# Patient Record
Sex: Male | Born: 1955 | Race: White | Hispanic: No | Marital: Married | Smoking: Former smoker
Health system: Southern US, Community
[De-identification: ages and names within clinical notes are randomized; demographics above are authoritative.]

## PROBLEM LIST (undated history)

## (undated) DIAGNOSIS — Z789 Other specified health status: Secondary | ICD-10-CM

## (undated) DIAGNOSIS — K219 Gastro-esophageal reflux disease without esophagitis: Secondary | ICD-10-CM

## (undated) DIAGNOSIS — J309 Allergic rhinitis, unspecified: Secondary | ICD-10-CM

## (undated) DIAGNOSIS — J329 Chronic sinusitis, unspecified: Secondary | ICD-10-CM

## (undated) DIAGNOSIS — Z0282 Encounter for adoption services: Secondary | ICD-10-CM

## (undated) DIAGNOSIS — F419 Anxiety disorder, unspecified: Secondary | ICD-10-CM

## (undated) DIAGNOSIS — T7840XA Allergy, unspecified, initial encounter: Secondary | ICD-10-CM

## (undated) DIAGNOSIS — M1990 Unspecified osteoarthritis, unspecified site: Secondary | ICD-10-CM

## (undated) DIAGNOSIS — I10 Essential (primary) hypertension: Secondary | ICD-10-CM

## (undated) DIAGNOSIS — R12 Heartburn: Secondary | ICD-10-CM

## (undated) DIAGNOSIS — C679 Malignant neoplasm of bladder, unspecified: Secondary | ICD-10-CM

## (undated) DIAGNOSIS — J302 Other seasonal allergic rhinitis: Secondary | ICD-10-CM

## (undated) DIAGNOSIS — D494 Neoplasm of unspecified behavior of bladder: Secondary | ICD-10-CM

## (undated) DIAGNOSIS — C801 Malignant (primary) neoplasm, unspecified: Secondary | ICD-10-CM

## (undated) HISTORY — DX: Malignant (primary) neoplasm, unspecified: C80.1

## (undated) HISTORY — DX: Allergy, unspecified, initial encounter: T78.40XA

## (undated) HISTORY — DX: Gastro-esophageal reflux disease without esophagitis: K21.9

## (undated) MED FILL — Fosaprepitant Dimeglumine For IV Infusion 150 MG (Base Eq): INTRAVENOUS | Qty: 5 | Status: AC

---

## 2017-11-14 ENCOUNTER — Other Ambulatory Visit: Payer: Self-pay

## 2017-11-14 ENCOUNTER — Emergency Department (HOSPITAL_COMMUNITY)
Admission: EM | Admit: 2017-11-14 | Discharge: 2017-11-14 | Disposition: A | Discharge status: Home / Self Care | Payer: No Typology Code available for payment source | Attending: Emergency Medicine | Admitting: Emergency Medicine

## 2017-11-14 ENCOUNTER — Encounter (HOSPITAL_COMMUNITY): Payer: Self-pay | Admitting: Emergency Medicine

## 2017-11-14 ENCOUNTER — Emergency Department (HOSPITAL_COMMUNITY): Payer: No Typology Code available for payment source

## 2017-11-14 DIAGNOSIS — J9801 Acute bronchospasm: Secondary | ICD-10-CM | POA: Diagnosis not present

## 2017-11-14 DIAGNOSIS — Z79899 Other long term (current) drug therapy: Secondary | ICD-10-CM | POA: Insufficient documentation

## 2017-11-14 DIAGNOSIS — R0602 Shortness of breath: Secondary | ICD-10-CM | POA: Diagnosis present

## 2017-11-14 LAB — BASIC METABOLIC PANEL
ANION GAP: 9 (ref 5–15)
BUN: 7 mg/dL — ABNORMAL LOW (ref 8–23)
CO2: 22 mmol/L (ref 22–32)
Calcium: 9 mg/dL (ref 8.9–10.3)
Chloride: 99 mmol/L (ref 98–111)
Creatinine, Ser: 0.69 mg/dL (ref 0.61–1.24)
GFR calc Af Amer: >60 mL/min (ref >60)
GFR calc non Af Amer: >60 mL/min (ref >60)
GLUCOSE: 109 mg/dL — AB (ref 70–99)
POTASSIUM: 3.7 mmol/L (ref 3.5–5.1)
Sodium: 130 mmol/L — ABNORMAL LOW (ref 135–145)

## 2017-11-14 LAB — CBC WITH DIFFERENTIAL/PLATELET
Basophils Absolute: 0 10*3/uL (ref 0.0–0.1)
Basophils Relative: 1 %
Eosinophils Absolute: 0.1 10*3/uL (ref 0.0–0.7)
Eosinophils Relative: 1 %
HEMATOCRIT: 40.7 % (ref 39.0–52.0)
Hemoglobin: 14.5 g/dL (ref 13.0–17.0)
LYMPHS ABS: 2 10*3/uL (ref 0.7–4.0)
LYMPHS PCT: 40 %
MCH: 33.2 pg (ref 26.0–34.0)
MCHC: 35.6 g/dL (ref 30.0–36.0)
MCV: 93.1 fL (ref 78.0–100.0)
MONO ABS: 0.8 10*3/uL (ref 0.1–1.0)
Monocytes Relative: 15 %
NEUTROS ABS: 2.2 10*3/uL (ref 1.7–7.7)
Neutrophils Relative %: 43 %
Platelets: 312 10*3/uL (ref 150–400)
RBC: 4.37 MIL/uL (ref 4.22–5.81)
RDW: 12.2 % (ref 11.5–15.5)
WBC: 5 10*3/uL (ref 4.0–10.5)

## 2017-11-14 LAB — POCT I-STAT TROPONIN I
Troponin i, poc: 0 ng/mL (ref 0.00–0.08)
Troponin i, poc: 0 ng/mL (ref 0.00–0.08)

## 2017-11-14 LAB — D-DIMER, QUANTITATIVE (NOT AT ARMC): D DIMER QUANT: 0.31 ug{FEU}/mL (ref 0.00–0.50)

## 2017-11-14 LAB — BRAIN NATRIURETIC PEPTIDE: B Natriuretic Peptide: 30.4 pg/mL (ref 0.0–100.0)

## 2017-11-14 MED ORDER — ALBUTEROL SULFATE (2.5 MG/3ML) 0.083% IN NEBU
5.0000 mg | INHALATION_SOLUTION | Freq: Once | RESPIRATORY_TRACT | Status: AC
  Administered 2017-11-14: 5 mg via RESPIRATORY_TRACT
  Filled 2017-11-14: qty 6

## 2017-11-14 MED ORDER — IPRATROPIUM-ALBUTEROL 0.5-2.5 (3) MG/3ML IN SOLN
3.0000 mL | Freq: Once | RESPIRATORY_TRACT | Status: AC
  Administered 2017-11-14: 3 mL via RESPIRATORY_TRACT
  Filled 2017-11-14: qty 3

## 2017-11-14 MED ORDER — PREDNISONE 20 MG PO TABS
40.0000 mg | ORAL_TABLET | Freq: Once | ORAL | Status: AC
  Administered 2017-11-14: 40 mg via ORAL
  Filled 2017-11-14: qty 2

## 2017-11-14 MED ORDER — PREDNISONE 20 MG PO TABS: 40.0000 mg | ORAL_TABLET | Freq: Every day | ORAL | 0 refills | Status: DC

## 2017-11-14 NOTE — ED Triage Notes (Signed)
Pt POV with complaints of shortness of breath. Patient states he's had SOB all week but got worse tonight.

## 2017-11-14 NOTE — ED Provider Notes (Signed)
[facility] DEPT Provider Note   CSN: [id number] Arrival date & time: 11/14/17  0045     History   Chief Complaint Chief Complaint  Patient presents with  . Shortness of Breath    HPI Billy Hernandez is a 62 y.o. male.  HPI 62 year old Caucasian male with no pertinent past medical history presents to the emergency department today for evaluation of shortness of breath.  Patient states that this has been ongoing for 1 to 2 weeks.  States his symptoms are intermittent.  Patient states that the shortness of breath is not exertional.  States that it is hard to take a deep breath at times.  Patient reports wheezing intermittently.  He also reports a productive cough of yellow-green sputum.  Patient reports some nasal congestion.  Denies any fevers or chills.  Denies any chest pain.  Patient denies any cardiac history.  Denies any recent surgeries or hospitalizations, lower extremity edema or calf tenderness, history of DVT/PE, tobacco use, hemoptysis, cancer diagnosis.  Patient states that he believes this is from his job.  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  Patient thinks that he inhaled this and this caused his shortness of breath.  Patient did see his primary care doctor last week for same symptoms where he had a normal x-ray at that time.  Patient was referred to pulmonology which she saw the beginning of this week.  They told him that they could not figure out the reason for his shortness of breath.  They did order pulmonary functions test which she has not had performed yet.  They placed him on a Z-Pak.  Patient was also started on Symbicort along with his albuterol.  Patient was given albuterol inhaler prior to my examination states that this seemed to help his symptoms however they seem to be slightly returning.  Patient denies any known sick contacts.  He has never been diagnosed with  COPD or asthma.  Adamantly denies tobacco use or history of same.  Pt denies any fever, chill, ha, vision changes, lightheadedness, dizziness, congestion, neck pain, cp, ,  abd pain, n/v/d, urinary symptoms, change in bowel habits, melena, hematochezia, lower extremity paresthesias.  History reviewed. No pertinent past medical history.  There are no active problems to display for this patient.   History reviewed. No pertinent surgical history.      Home Medications    Prior to Admission medications   Medication Sig Start Date End Date Taking? Authorizing Provider  albuterol (PROVENTIL HFA;VENTOLIN HFA) 108 (90 Base) MCG/ACT inhaler Inhale 2 puffs into the lungs every 6 (six) hours as needed for wheezing or shortness of breath.   Yes [provider]  azithromycin (ZITHROMAX) 250 MG tablet Take 500 mg by mouth daily. 11/13/17 11/17/17 Yes [provider]  budesonide-formoterol (SYMBICORT) 160-4.5 MCG/ACT inhaler Inhale 2 puffs into the lungs 2 (two) times daily.   Yes [provider]    Family History No family history on file.  Social History Social History   Tobacco Use  . Smoking status: Never Smoker  Substance Use Topics  . Alcohol use: Yes    Alcohol/week: 42.0 standard drinks    Types: 42 Cans of beer per week    Comment: 6 beers a day  . Drug use: Never     Allergies   Patient has no known allergies.   Review of Systems Review of Systems  All  other systems reviewed and are negative.    Physical Exam Updated Vital Signs BP (!) 157/76 (BP Location: Left Arm)   Pulse 67   Resp 20   SpO2 100%   Physical Exam  Constitutional: He is oriented to person, place, and time. He appears well-developed and well-nourished.  Non-toxic appearance. No distress.  HENT:  Head: Normocephalic and atraumatic.  Nose: Nose normal.  Mouth/Throat: Oropharynx is clear and moist.  Eyes: Pupils are equal, round, and reactive to light. Conjunctivae are  normal. Right eye exhibits no discharge. Left eye exhibits no discharge. No scleral icterus.  Neck: Normal range of motion. Neck supple. No JVD present. No tracheal deviation present.  Cardiovascular: Normal rate, regular rhythm, normal heart sounds and intact distal pulses.  Pulmonary/Chest: Effort normal. No accessory muscle usage. No tachypnea and no bradypnea. No respiratory distress. He has decreased breath sounds. He has no wheezes. He has no rhonchi. He has no rales. He exhibits no tenderness.  No hypoxia or tachypnea.  Abdominal: Soft. Bowel sounds are normal. He exhibits no distension. There is no tenderness. There is no rebound and no guarding.  Musculoskeletal: Normal range of motion.  No lower extremity edema or calf tenderness.  Lymphadenopathy:    He has no cervical adenopathy.  Neurological: He is alert and oriented to person, place, and time.  Skin: Skin is warm and dry. Capillary refill takes less than 2 seconds. He is not diaphoretic. No pallor.  Psychiatric: His behavior is normal. Judgment and thought content normal.  Nursing note and vitals reviewed.    ED Treatments / Results  Labs (all labs ordered are listed, but only abnormal results are displayed) Labs Reviewed  BASIC METABOLIC PANEL - Abnormal; Notable for the following components:      Result Value   Sodium 130 (*)    Glucose, Bld 109 (*)    BUN 7 (*)    All other components within normal limits  CBC WITH DIFFERENTIAL/PLATELET  D-DIMER, QUANTITATIVE (NOT AT [facility])  BRAIN NATRIURETIC PEPTIDE  I-STAT TROPONIN, ED  POCT I-STAT TROPONIN I  I-STAT TROPONIN, ED  POCT I-STAT TROPONIN I    EKG EKG Interpretation  Date/Time:  Thursday November 14 2017 00:51:11 EDT Ventricular Rate:  70 PR Interval:    QRS Duration: 99 QT Interval:  406 QTC Calculation: 439 R Axis:   71 Text Interpretation:  Sinus rhythm Minimal ST elevation, inferior leads No previous ECGs available Confirmed by Shanon Rosser [phone]) on  11/14/2017 1:05:11 AM   Radiology Dg Chest 2 View  Result Date: 11/14/2017 CLINICAL DATA:  Shortness of breath for 1 week. EXAM: CHEST - 2 VIEW COMPARISON:  None. FINDINGS: The cardiomediastinal contours are normal. The lungs are clear. Pulmonary vasculature is normal. No consolidation, pleural effusion, or pneumothorax. No acute osseous abnormalities are seen. IMPRESSION: No acute pulmonary process. Electronically Signed   By: Jeb Levering M.D.   On: 11/14/2017 01:55    Procedures Procedures (including critical care time)  Medications Ordered in ED Medications  albuterol (PROVENTIL) (2.5 MG/3ML) 0.083% nebulizer solution 5 mg (5 mg Nebulization Given 11/14/17 0145)  predniSONE (DELTASONE) tablet 40 mg (40 mg Oral Given 11/14/17 0440)  ipratropium-albuterol (DUONEB) 0.5-2.5 (3) MG/3ML nebulizer solution 3 mL (3 mLs Nebulization Given 11/14/17 0439)     Initial Impression / Assessment and Plan / ED Course  I have reviewed the triage vital signs and the nursing notes.  Pertinent labs & imaging results that were available during my care of the  patient were reviewed by me and considered in my medical decision making (see chart for details).     Patient presented to the ED for ongoing shortness of breath for the past 2 weeks.  Patient was seen by a pulmonologist the beginning of the week and has primary functions test scheduled.  Patient denies any chest pain which he states he cannot take a deep breath.  He suspect this is secondary to work environment exposures.  Patient's vital signs have been reassuring in the ED today.  He is not hypoxic or tachypneic.  No fever or tachycardia noted.  Patient is hypertensive without history of hypertension.  This will need to be monitored by patient and rechecked her primary care to make sure he does not need to start on blood pressure medication.  On exam patient's breath sounds are equal bilaterally.  There are clear to auscultation.  Heart regular rate  and rhythm.  No lower extremity edema.  Neurovascular intact in all extremities.  Lab work has been very reassuring.  No leukocytosis.  Normal hemoglobin.  Patient has a mild hyponatremia of 130 that will need be rechecked by his primary care doctor.  No other significant electrolyte derangement.  Normal kidney function.  Patient's BNP was normal.  D-dimer was negative.  Negative delta troponins.  EKG shows normal sinus rhythm with some nonspecific T wave changes but no signs of acute ischemia.  X-ray was reassuring without any signs of focal infiltrate.  Pt states that he was recently started on antibiotics and Symbicort along with albuterol by pulmonologist.  I suspect the patient has some underlying lung etiology Ackley restrictive airway disease however I do believe he would benefit from pulmonary functions test and possible sleep study as patient seems to desat while he is sleeping in the room.  Patient able to ambulate in the hallway without any tachypnea or hypoxia.  He feels somewhat improved with the albuterol treatment and the steroid.  Clinical presentation is not consistent with PE, ACS, dissection, pneumonia.  Patient does have follow-up with pulmonologist.  Will give steroid burst for home use.  Suspect bronchospasms.  Pt denies any fever, chill, ha, vision changes, lightheadedness, dizziness, congestion, neck pain, cp, sob, cough, abd pain, n/v/d, urinary symptoms, change in bowel habits, melena, hematochezia, lower extremity paresthesias.  Patient was also seen and evaluated by my attending who is agreeable the above plan.  Final Clinical Impressions(s) / ED Diagnoses   Final diagnoses:  Acute bronchospasm    ED Discharge Orders         Ordered    predniSONE (DELTASONE) 20 MG tablet  Daily     11/14/17 0715           Doristine Devoid, PA-C 11/14/17 2149    Molpus, Jenny Reichmann, MD 11/15/17 [phone]

## 2017-11-14 NOTE — ED Notes (Signed)
Prescription called in to the [facility] on [address], ok'ed via Dr Darl Householder

## 2017-11-19 ENCOUNTER — Encounter: Payer: Self-pay | Admitting: Pulmonary Disease

## 2017-11-19 LAB — PULMONARY FUNCTION TEST

## 2017-11-27 ENCOUNTER — Encounter: Payer: Self-pay | Admitting: Pulmonary Disease

## 2017-11-27 ENCOUNTER — Ambulatory Visit (INDEPENDENT_AMBULATORY_CARE_PROVIDER_SITE_OTHER): Payer: PRIVATE HEALTH INSURANCE | Admitting: Pulmonary Disease

## 2017-11-27 ENCOUNTER — Telehealth: Payer: Self-pay | Admitting: Pulmonary Disease

## 2017-11-27 VITALS — BP 120/62

## 2017-11-27 DIAGNOSIS — R9389 Abnormal findings on diagnostic imaging of other specified body structures: Secondary | ICD-10-CM

## 2017-11-27 DIAGNOSIS — R0602 Shortness of breath: Secondary | ICD-10-CM | POA: Diagnosis not present

## 2017-11-27 MED ORDER — FLUTICASONE FUROATE-VILANTEROL 200-25 MCG/INH IN AEPB: 1.0000 | INHALATION_SPRAY | Freq: Every day | RESPIRATORY_TRACT | 3 refills | Status: DC

## 2017-11-27 NOTE — Progress Notes (Signed)
Billy Hernandez    [id number]    05-Mar-1956  Primary Care Physician:Patient, No Pcp Per  Referring Physician: No referring provider defined for this encounter.  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 albuterol (PROVENTIL HFA;VENTOLIN HFA) 108 (90 Base) MCG/ACT inhaler Inhale 2 puffs into the lungs every 6 (six) hours as needed for wheezing or shortness of breath.  . budesonide-formoterol (SYMBICORT) 160-4.5 MCG/ACT inhaler Inhale 2 puffs into the lungs 2 (two) times daily.  . predniSONE (DELTASONE) 20 MG tablet Take 2 tablets (40 mg total) by mouth daily.  . fluticasone furoate-vilanterol (BREO ELLIPTA) 200-25 MCG/INH AEPB Inhale 1 puff into the lungs daily.   No facility-administered encounter medications on file as of 11/27/2017.     Allergies as of 11/27/2017  . (No Known Allergies)   History reviewed. No pertinent past medical history.  No contributory family history-was adopted  Social History   Socioeconomic History   . Marital status: Divorced    Spouse name: Not on file  . Number of children: Not on file  . Years of education: Not on file  . Highest education level: Not on file  Occupational History  . Not on file  Social Needs  . Financial resource strain: Not on file  . Food insecurity:    Worry: Not on file    Inability: Not on file  . Transportation needs:    Medical: Not on file    Non-medical: Not on file  Tobacco Use  . Smoking status: Never Smoker  . Smokeless tobacco: Never Used  Substance and Sexual Activity  . Alcohol use: Yes    Alcohol/week: 42.0 standard drinks    Types: 42 Cans of beer per week    Comment: 6 beers a day  . Drug use: Never  . Sexual activity: Never  Lifestyle  . Physical activity:    Days per week: Not on file    Minutes per session: Not on file  . Stress: Not on file  Relationships  . Social connections:    Talks on phone: Not on file    Gets together: Not on file    Attends religious service: Not on file    Active member of club or organization: Not on file    Attends meetings of clubs or organizations: Not on file    Relationship status: Not on file  . Intimate partner violence:    Fear of current or ex partner: Not on file    Emotionally abused: Not on file    Physically abused: Not  on file    Forced sexual activity: Not on file  Other Topics Concern  . Not on file  Social History Narrative  . Not on file    Review of Systems  Constitutional: Negative.   HENT: Negative.   Eyes: Negative.   Respiratory: Positive for chest tightness and shortness of breath.   Cardiovascular: Negative.  Negative for chest pain and leg swelling.  Gastrointestinal: Negative.   Endocrine: Negative.   Genitourinary: Negative.   Musculoskeletal: Negative.   Psychiatric/Behavioral: Positive for agitation and sleep disturbance.    Vitals:   11/27/17 1526  BP: 120/62     Physical Exam  Constitutional: He is oriented to person, place, and time. He  appears well-developed and well-nourished.  HENT:  Head: Normocephalic and atraumatic.  Eyes: Pupils are equal, round, and reactive to light. Conjunctivae and EOM are normal. Right eye exhibits no discharge. Left eye exhibits no discharge.  Neck: Normal range of motion. Neck supple. No tracheal deviation present. No thyromegaly present.  Cardiovascular: Normal rate and regular rhythm.  Pulmonary/Chest: Breath sounds normal. No respiratory distress. He has no wheezes.  Abdominal: Soft. Bowel sounds are normal. He exhibits no distension. There is no tenderness.  Musculoskeletal: Normal range of motion. He exhibits no edema.  Neurological: He is alert and oriented to person, place, and time. He has normal reflexes.  Skin: Skin is warm and dry. No erythema.  Psychiatric: He has a normal mood and affect.   Data Reviewed: ED records reviewed  I did review some documentation that he had from his recent visit to [facility]  Assessment:  .  Shortness of breath  .  Abnormal chest x-ray showing hyperinflated lung fields  Plan/Recommendations:  .  We will continue bronchodilator treatments  .  Obtain a CT scan of the chest, angio study  .  Obtain a copy of recent PFT and if not available we will repeat PFT  Breo will be initiated Prednisone did not seem to help significantly in the past  We will obtain an hypersensitivity panel depending on PFT findings  I will see him back in about 4 to 6 weeks     [name] MD [facility] 11/27/2017, 3:26 PM  CC: No ref. provider found

## 2017-11-27 NOTE — Patient Instructions (Addendum)
Significant shortness of breath about 3 weeks  Has visited with multiple providers and recently to the emergency room   PFT reportedly normal  Chest x-ray with significant hyperinflation  We will obtain a CT PE angio  Obtain a copy of the recent PFT or repeat if previous cannot be obtained  Bronchodilator treatment-Breo 200, 1 puff daily  We will consider hypersensitivity panel following obtaining PFT results

## 2017-11-27 NOTE — Telephone Encounter (Signed)
Samples, coupon, and patient assistance application given. Instructed patient to return application with supporting documents. Patient states his prescription at [facility] was $300. Informed patient that I would let MD Olalere know this information as FYI. Will also route information to his nurse as Juluis Rainier. Nothing further needed at this time.

## 2017-11-28 ENCOUNTER — Ambulatory Visit (INDEPENDENT_AMBULATORY_CARE_PROVIDER_SITE_OTHER)
Admission: RE | Admit: 2017-11-28 | Discharge: 2017-11-28 | Disposition: A | Discharge status: Home / Self Care | Payer: PRIVATE HEALTH INSURANCE | Source: Ambulatory Visit | Attending: Pulmonary Disease | Admitting: Pulmonary Disease

## 2017-11-28 ENCOUNTER — Telehealth: Payer: Self-pay | Admitting: Pulmonary Disease

## 2017-11-28 DIAGNOSIS — R0602 Shortness of breath: Secondary | ICD-10-CM | POA: Diagnosis not present

## 2017-11-28 MED ORDER — IOPAMIDOL (ISOVUE-370) INJECTION 76%
80.0000 mL | Freq: Once | INTRAVENOUS | Status: AC | PRN
  Administered 2017-11-28: 80 mL via INTRAVENOUS

## 2017-11-28 NOTE — Telephone Encounter (Signed)
Called and spoke to patient. Patient stated he would like to know his CT results and reported he is "on pins and needles" as he waits to hear results.  CT was done today.  Dr. Ander Slade please advise, thanks!

## 2017-11-29 ENCOUNTER — Telehealth: Payer: Self-pay | Admitting: Pulmonary Disease

## 2017-11-29 DIAGNOSIS — R0602 Shortness of breath: Secondary | ICD-10-CM

## 2017-11-29 DIAGNOSIS — R918 Other nonspecific abnormal finding of lung field: Secondary | ICD-10-CM

## 2017-11-29 NOTE — Telephone Encounter (Signed)
I Called patient  CT report relayed to patient  We will obtain a repeat CT scan of the chest without contrast in 3 months to check on the scarring in the left lung  Hypersensitivity panel  Pulmonary function study   Follow-up as usual  Please notify patient of ordered studies, I did discuss this with him already

## 2017-11-29 NOTE — Telephone Encounter (Signed)
Patient's pulmonary function test reviewed  Performed on 11/19/2017-shows normal spirometry, TLC of 113 , increased DLCO  May be seen in asthma with air trapping, increased lung volumes.    Cancel ordered PFT  He does not need a repeat

## 2017-11-29 NOTE — Telephone Encounter (Signed)
Spoke with patient, patient aware results are not available, informed patient as soon as they were available we would call him.Patient wanted to know if it would be today, I informed patient that MD AO is seeing patients in clinic this morning and that we are working as quickly as possible to get him those results. Patient voiced understanding.

## 2017-11-29 NOTE — Telephone Encounter (Signed)
Called and spoke to pt. Pt has provided me with number and ext to obtain PFT results from [facility] contact number is [phone] ext. [phone]. I have spoken to [facility] with [facility] an requested that PFT results be faxed to our office.  Will leave message in triage until fax is received.

## 2017-11-29 NOTE — Telephone Encounter (Signed)
Pt is calling back about the results for the CT [phone]

## 2017-12-03 NOTE — Telephone Encounter (Signed)
Will route to Shirlee More, LPN to follow up

## 2017-12-03 NOTE — Telephone Encounter (Signed)
I have spoke with the patient today  Nothing further needed at time.

## 2017-12-03 NOTE — Telephone Encounter (Signed)
Call and spoke with patient he is aware of recommendation and orders have been placed nothing further needed.

## 2017-12-03 NOTE — Telephone Encounter (Signed)
Patient is aware of this nothing further needed.

## 2017-12-05 ENCOUNTER — Other Ambulatory Visit: Payer: PRIVATE HEALTH INSURANCE

## 2017-12-05 ENCOUNTER — Telehealth: Payer: Self-pay | Admitting: Pulmonary Disease

## 2017-12-05 ENCOUNTER — Telehealth: Payer: Self-pay | Admitting: *Deleted

## 2017-12-05 ENCOUNTER — Encounter: Payer: Self-pay | Admitting: Pulmonary Disease

## 2017-12-05 ENCOUNTER — Ambulatory Visit (INDEPENDENT_AMBULATORY_CARE_PROVIDER_SITE_OTHER): Payer: PRIVATE HEALTH INSURANCE | Admitting: Pulmonary Disease

## 2017-12-05 VITALS — BP 118/68 | HR 62 | Temp 98.2°F | Ht 72.0 in | Wt 176.4 lb

## 2017-12-05 DIAGNOSIS — R0600 Dyspnea, unspecified: Secondary | ICD-10-CM

## 2017-12-05 DIAGNOSIS — Z0000 Encounter for general adult medical examination without abnormal findings: Secondary | ICD-10-CM | POA: Insufficient documentation

## 2017-12-05 DIAGNOSIS — R0602 Shortness of breath: Secondary | ICD-10-CM

## 2017-12-05 DIAGNOSIS — J309 Allergic rhinitis, unspecified: Secondary | ICD-10-CM

## 2017-12-05 DIAGNOSIS — R1310 Dysphagia, unspecified: Secondary | ICD-10-CM

## 2017-12-05 DIAGNOSIS — R4589 Other symptoms and signs involving emotional state: Secondary | ICD-10-CM

## 2017-12-05 LAB — NITRIC OXIDE: Nitric Oxide: 15

## 2017-12-05 MED ORDER — MONTELUKAST SODIUM 10 MG PO TABS: 10.0000 mg | ORAL_TABLET | Freq: Every day | ORAL | 3 refills | Status: DC

## 2017-12-05 MED ORDER — FLUTICASONE FUROATE-VILANTEROL 200-25 MCG/INH IN AEPB: 1.0000 | INHALATION_SPRAY | Freq: Every day | RESPIRATORY_TRACT | 0 refills | Status: DC

## 2017-12-05 NOTE — Progress Notes (Signed)
@Patient  ID: Billy Hernandez, male    DOB: [birth year], 62 y.o.   MRN: [id number]  Chief Complaint  Patient presents with  . Acute Visit    sob    Referring provider: No ref. provider found  HPI:  62 year old male patient initially referred to our office on 11/27/17 3 weeks of acute shortness of breath.  PMH: allergic rhinitis   Smoker/ Smoking History: Former Smoker (few hits tobacco smoke in his teens).   Maintenance:  Breo Ellipta 200 Pt of: Dr. Ander Slade  Recent [facility] Encounters:   11/27/17- initial office visit-Dr. Ander Slade 3 weeks of Shortness of breath.  Patient is woken up from sleep and feels that he cannot breathe.  Patient reports he was seen by a physician at [facility] was placed on inhalers and a PFT was performed.  Patient followed up in the emergency department and received steroids - Has helped.  Denies any history of lung disease.  He is a former smoker.  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 Pt reports wearing a mask with good air flow. Denies symptoms of reflux, Denies history of anxiety, denies history of heart disease. Plan: Continue inhaler bronchodilator treatments, CT of chest- Angio study, obtain meds, Breo Ellipta will be initiated, hypersensitivity panel depending on PFT findings  11/29/17-telephone encounter- AO- CT results discussed with patient.  Obtain repeat CT without contrast in 3 months.  12/05/2017-telephone encounter- patient calls reporting he has been increasingly short of breath symptoms started at 2 AM in bed symptoms.  Patient would also like allergy medication change.  Patient scheduled for acute visit today.   12/05/2017  - Visit   62 year old patient seen today for acute visit.  Patient reported via telephone encounter today that he had sudden increased shortness of breath starting at 2 AM today.  On arrival to office patient reports  that he has had no acute changes and that it is the same shortness of breath that he has had for the last 4 to 6 weeks.  Patient would like to know why he feels like he cannot take a deep breath in.  We received patient's pulmonary function test from [facility] on 11/19/2017 which showed normal spirometry, TLC of 113 and increased DLCO.  Patient also completed CT recently which showed scarring.  We will repeat CT in December/2019.  Patient denies any significant past medical history.  Patient is on allergy medications but has not endorsing allergy diagnosis.  Patient would like recommendations on new allergy medications to take.  Patient denies anxiety does not feel that he is an anxious person.  Patient feels that he just has anxiety when he "cannot breathe".  Patient denies snoring does not feel that he has a diagnosis of obstructive sleep apnea.  Patient denies any daytime fatigue.  Patient denies morning headaches.  Patient does endorse having trouble swallowing and having narrow nasal and oral passages.  Patient reports that he diagnosed himself with these disorders and that he has not had a formal diagnosis or evaluation by ENT or primary care.  Patient reports that his entire life he has been having difficulty swallowing with getting choked up and sometimes vomiting if he chews too quickly.  Patient also endorses slowed motility.    Patient presenting today frustrated that we have not achieved a diagnosis since his initial appointment on 11/27/2017.  Patient is unsure why he is so  progressively short of breath and why he feels like he cannot take a deep breath in.  Patient has not completed his hypersensitivity pneumonitis panel as he was instructed to do via recent telephone encounter.     Tests:  11/19/2017-PFT- normal spirometry, TLC of 113, increased DLCO   11/14/2017-d-dimer-0.31 11/14/2017-BNP-30.4 11/14/2017-CBC with differential-hemoglobin 14.5  11/28/2017-CT Angio- no PE, no  thoracic aortic aneurysm or dissection, areas of scattered lung scarring on left 2 mm nodular opacity in right upper lobe >>>We will repeat CT in 3 months  Chart Review:  12/03/2017-hypersensitivity pneumonitis panel ordered-has not been completed yet   Specialty Problems      Pulmonary Problems   Allergic rhinitis   Dyspnea    12/05/2017-FeNO-15        No Known Allergies   There is no immunization history on file for this patient. >>> Requested records from [facility]  History reviewed. No pertinent past medical history. >>> Requested records from [facility]  Tobacco History: Social History   Tobacco Use  Smoking Status Never Smoker  Smokeless Tobacco Never Used   Counseling given: Not Answered   Outpatient Encounter Medications as of 12/05/2017  Medication Sig  . albuterol (PROVENTIL HFA;VENTOLIN HFA) 108 (90 Base) MCG/ACT inhaler Inhale 2 puffs into the lungs every 6 (six) hours as needed for wheezing or shortness of breath.  . fluticasone furoate-vilanterol (BREO ELLIPTA) 200-25 MCG/INH AEPB Inhale 1 puff into the lungs daily.  . montelukast (SINGULAIR) 10 MG tablet Take 1 tablet (10 mg total) by mouth at bedtime.  . [DISCONTINUED] montelukast (SINGULAIR) 10 MG tablet Take 10 mg by mouth at bedtime.  . budesonide-formoterol (SYMBICORT) 160-4.5 MCG/ACT inhaler Inhale 2 puffs into the lungs 2 (two) times daily.  . fluticasone furoate-vilanterol (BREO ELLIPTA) 200-25 MCG/INH AEPB Inhale 1 puff into the lungs daily.  . predniSONE (DELTASONE) 20 MG tablet Take 2 tablets (40 mg total) by mouth daily. (Patient not taking: Reported on 12/05/2017)   No facility-administered encounter medications on file as of 12/05/2017.      Review of Systems  Review of Systems  Constitutional: Positive for fatigue. Negative for activity change, chills, fever and unexpected weight change.  HENT: Positive for trouble swallowing (eats slowly, food gets stuck ).  Negative for congestion, postnasal drip, rhinorrhea, sinus pressure, sinus pain, sneezing and sore throat.   Eyes: Negative.   Respiratory: Positive for shortness of breath (for last month, on and off, unable to lay flat). Negative for cough, chest tightness and wheezing.        Unable to lay flat due to shortness of breath   Cardiovascular: Negative for chest pain, palpitations and leg swelling.  Gastrointestinal: Negative for constipation, diarrhea, nausea and vomiting.       Difficultly swallowing   Endocrine: Negative.   Musculoskeletal: Negative.   Skin: Negative.   Allergic/Immunologic: Positive for environmental allergies.  Neurological: Positive for dizziness (occasionally with position changes ). Negative for headaches.  Psychiatric/Behavioral: Positive for sleep disturbance. Negative for dysphoric mood. The patient is nervous/anxious.   All other systems reviewed and are negative.   Physical Exam  BP 118/68 (BP Location: Left Arm, Cuff Size: Normal)   Pulse 62   Temp 98.2 F (36.8 C) (Oral)   Ht 6' (1.829 m)   Wt 176 lb 6.4 oz (80 kg)   SpO2 98%   BMI 23.92 kg/m   Wt Readings from Last 5 Encounters:  12/05/17 176 lb 6.4 oz (80 kg)    Physical  Exam  Constitutional: He is oriented to person, place, and time and well-developed, well-nourished, and in no distress. No distress.  HENT:  Head: Normocephalic and atraumatic.  Right Ear: Hearing, tympanic membrane, external ear and ear canal normal.  Left Ear: Hearing, tympanic membrane, external ear and ear canal normal.  Nose: Nose normal. Right sinus exhibits no maxillary sinus tenderness and no frontal sinus tenderness. Left sinus exhibits no maxillary sinus tenderness and no frontal sinus tenderness.  Mouth/Throat: Uvula is midline, oropharynx is clear and moist and mucous membranes are normal. No oropharyngeal exudate, posterior oropharyngeal edema or tonsillar abscesses.  Mallampati 2  Eyes: Pupils are equal, round, and  reactive to light.  Neck: Normal range of motion. Neck supple. No JVD present. No thyroid mass and no thyromegaly present.  Cardiovascular: Normal rate, regular rhythm and normal heart sounds.  Pulmonary/Chest: Effort normal and breath sounds normal. No accessory muscle usage or stridor. No respiratory distress. He has no decreased breath sounds. He has no wheezes. He has no rhonchi.  Musculoskeletal: Normal range of motion. He exhibits no edema.  Lymphadenopathy:    He has no cervical adenopathy.  Neurological: He is alert and oriented to person, place, and time. Gait normal.  Skin: Skin is warm and dry. He is not diaphoretic. No erythema.  Psychiatric: Memory and judgment normal. His mood appears anxious. He expresses no homicidal and no suicidal ideation. He expresses no suicidal plans and no homicidal plans. He has a flat affect.  Nursing note and vitals reviewed.    Lab Results:  CBC    Component Value Date/Time   WBC 5.0 11/14/2017 0318   RBC 4.37 11/14/2017 0318   HGB 14.5 11/14/2017 0318   HCT 40.7 11/14/2017 0318   PLT 312 11/14/2017 0318   MCV 93.1 11/14/2017 0318   MCH 33.2 11/14/2017 0318   MCHC 35.6 11/14/2017 0318   RDW 12.2 11/14/2017 0318   LYMPHSABS 2.0 11/14/2017 0318   MONOABS 0.8 11/14/2017 0318   EOSABS 0.1 11/14/2017 0318   BASOSABS 0.0 11/14/2017 0318    BMET    Component Value Date/Time   NA 130 (L) 11/14/2017 0318   K 3.7 11/14/2017 0318   CL 99 11/14/2017 0318   CO2 22 11/14/2017 0318   GLUCOSE 109 (H) 11/14/2017 0318   BUN 7 (L) 11/14/2017 0318   CREATININE 0.69 11/14/2017 0318   CALCIUM 9.0 11/14/2017 0318   GFRNONAA >60 11/14/2017 0318   GFRAA >60 11/14/2017 0318    BNP    Component Value Date/Time   BNP 30.4 11/14/2017 0318    ProBNP No results found for: PROBNP  Imaging: Dg Chest 2 View  Result Date: 11/14/2017 CLINICAL DATA:  Shortness of breath for 1 week. EXAM: CHEST - 2 VIEW COMPARISON:  None. FINDINGS: The  cardiomediastinal contours are normal. The lungs are clear. Pulmonary vasculature is normal. No consolidation, pleural effusion, or pneumothorax. No acute osseous abnormalities are seen. IMPRESSION: No acute pulmonary process. Electronically Signed   By: Jeb Levering M.D.   On: 11/14/2017 01:55   Ct Angio Chest W/cm &/or Wo Cm  Result Date: 11/28/2017 CLINICAL DATA:  Shortness of Breath EXAM: CT ANGIOGRAPHY CHEST WITH CONTRAST TECHNIQUE: Multidetector CT imaging of the chest was performed using the standard protocol during bolus administration of intravenous contrast. Multiplanar CT image reconstructions and MIPs were obtained to evaluate the vascular anatomy. CONTRAST:  80 mL ISOVUE-370 IOPAMIDOL (ISOVUE-370) INJECTION 76% COMPARISON:  Chest radiograph November 14, 2017 FINDINGS: Cardiovascular: There is  no demonstrable pulmonary embolus. There is no thoracic aortic aneurysm or dissection. Visualized great vessels appear normal except for minimal atherosclerotic calcification at the origins of the great vessels. There is no pericardial effusion or pericardial thickening evident. There is slight calcification in the mitral annulus. Mediastinum/Nodes: Thyroid appears normal. There is no appreciable thoracic adenopathy. No esophageal lesions are appreciable. Lungs/Pleura: There is no appreciable edema or consolidation. There is an area of apparent scarring in the lingula. There is also apparent scarring in the lateral segment of the left lower lobe. On axial slice 56 series 6, there is a 2 mm nodular opacity in the anterior segment of the right upper lobe. There are a few calcified granulomas scattered throughout the left lung. No frank airspace consolidation evident. No pleural effusion or pleural thickening evident. Upper Abdomen: Visualized upper abdominal structures appear unremarkable. Musculoskeletal: No blastic or lytic bone lesions are evident. There is degenerative change in the thoracic spine. No chest  wall lesions are appreciable. Review of the MIP images confirms the above findings. IMPRESSION: 1. No demonstrable pulmonary embolus. No thoracic aortic aneurysm or dissection. 2. Areas of scattered lung scarring on the left. No edema or consolidation. 2 mm nodular opacity in the right upper lobe. No follow-up needed if patient is low-risk. Non-contrast chest CT can be considered in 12 months if patient is high-risk. This recommendation follows the consensus statement: Guidelines for Management of Incidental Pulmonary Nodules Detected on CT Images: From the Fleischner Society 2017; Radiology 2017; 284:228-243. Occasional calcified granulomas. 3.  No demonstrable thoracic adenopathy. Electronically Signed   By: Lowella Grip III M.D.   On: 11/28/2017 11:09      Assessment & Plan:   62 year old patient presenting today for acute visit.  Patient with unexplained dyspnea for the last 4 to 6 weeks.  I believe that the shortness of breath is definitely multifactorial and potentially anxiety driven. Pt declined anxiety management today.     Patient has had a negative PE work-up.  D-dimer in August/2019.  CT Angio this month negative.  We will repeat CT in December/2019.  BNP in August 10/2017 was normal.  Patient does not appear or sound fluid overloaded today.  Patient has not noticed significant response to [facility] inhaler.  Patient had not been taking it in the morning.  Patient will start doing that today.  FeNO today is 15.  With patient's job exposures patient to proceed forward with hypersensitivity pneumonitis panel today.  We will also refer patient to ENT for upper airway evaluation.  We will also order modified barium swallow for swallow eval.  As well as primary care referral as patient does not have primary care provider.  Could consider echocardiogram and methacholine challenge test in the future if swallow eval, ENT evaluation, and hypersensitivity pneumonitis panel all come back  normal.  Patient to keep follow-up with Dr. Ander Slade  Dyspnea Breo Ellipta 200 >>> Take 1 puff daily in the morning right when you wake up >>>Rinse your mouth out after use >>>This is a daily maintenance inhaler, NOT a rescue inhaler >>>Contact our office if you are having difficulties affording or obtaining this medication >>>It is important for you to be able to take this daily and not miss any doses >>> Sample provided today   Hypersensitivity pneumonitis panel  >>>proceed to lab to to get completed downstairs today  FeNO today >>> 15 this is a normal response  I do not believe that you need a chest x-ray today  Complete repeat CT without contrast in December/2019  You can add daily antihistamine >>>Generic of Zyrtec, Allegra, Claritin-choose 1 >>>Start taking daily  Continue Singulair daily   Trouble swallowing Referral for primary care placed today  Referral to ENT today   Modified barium swallow eval ordered today   Healthcare maintenance  Referral for primary care placed today  Allergic rhinitis You can add daily antihistamine >>>Generic of Zyrtec, Allegra, Claritin-choose 1 >>>Start taking daily  Continue Singulair daily   Feeling anxious Offered patient treatment for anxiety.  Discussed Atarax and Klonopin.  Patient declined both.  Patient does not feel that he needs anxiety management at this time.  Informed patient to contact our office if he becomes willing to be treated for anxiety.     Lauraine Rinne, NP 12/05/2017

## 2017-12-05 NOTE — Assessment & Plan Note (Signed)
Referral for primary care placed today  Referral to ENT today   Modified barium swallow eval ordered today

## 2017-12-05 NOTE — Telephone Encounter (Signed)
Wyn Quaker NP is requesting lab work and office notes from medical records, message left for call back.

## 2017-12-05 NOTE — Progress Notes (Signed)
Discussed results with patient in office.  Nothing further is needed at this time.  Hanny Elsberry FNP  

## 2017-12-05 NOTE — Patient Instructions (Addendum)
Breo Ellipta 200 >>> Take 1 puff daily in the morning right when you wake up >>>Rinse your mouth out after use >>>This is a daily maintenance inhaler, NOT a rescue inhaler >>>Contact our office if you are having difficulties affording or obtaining this medication >>>It is important for you to be able to take this daily and not miss any doses  >>> Sample provided today   Hypersensitivity pneumonitis panel  >>>proceed to lab to to get completed downstairs today  FeNO today >>> 15 this is a normal response  I do not believe that you need a chest x-ray today  Complete repeat CT without contrast in December/2019  You can add daily antihistamine >>>Generic of Zyrtec, Allegra, Claritin-choose 1 >>>Start taking daily  Continue Singulair daily  Referral for primary care placed today  Referral to ENT today   Modified barium swallow eval ordered today      It is flu season:   >>>Remember to be washing your hands regularly, using hand sanitizer, be careful to use around herself with has contact with people who are sick will increase her chances of getting sick yourself. >>> Best ways to protect herself from the flu: Receive the yearly flu vaccine, practice good hand hygiene washing with soap and also using hand sanitizer when available, eat a nutritious meals, get adequate rest, hydrate appropriately   Please contact the office if your symptoms worsen or you have concerns that you are not improving.   Thank you for choosing [facility] for your healthcare, and for allowing [place] to partner with you on your healthcare journey. I am thankful to be able to provide care to you today.   Wyn Quaker FNP-C

## 2017-12-05 NOTE — Telephone Encounter (Addendum)
Called and spoke with pt who stated he is still having a lot of problems with SOB that is pretty much all the time. Pt states he could even be laying in bed and still having the SOB.  Pt states he could be sitting up at 2am in bed and having the SOB.  Pt states the breo is not helping him any with his symptoms. Pt also stated to me that he was wanting to have an allergy med changed.  While speaking to pt, pt was having to stop to try to get his breath after every few words being said due to struggling to get his breath.  I stated to pt we should get him scheduled for an appt.  Pt was hesitant at first but after telling pt again due to his breathing and having trouble getting his breathing while talking to me on the phone, he was reluctant and agreed to schedule an appt today.  I scheduled an appointment today for pt with Wyn Quaker, NP at 12pm. Aaron Edelman has been made aware that the appt was scheduled.  Nothing further needed.

## 2017-12-05 NOTE — Telephone Encounter (Signed)
Spoke with pt. He states that at his last OV, Aaron Edelman wanted him to start a new allergy medication. States that this prescription was not sent in. Advised him that the medications that Aaron Edelman recommended were OTC. He verbalized understanding. Nothing further was needed.

## 2017-12-05 NOTE — Assessment & Plan Note (Signed)
  Referral for primary care placed today

## 2017-12-05 NOTE — Assessment & Plan Note (Signed)
You can add daily antihistamine >>>Generic of Zyrtec, Allegra, Claritin-choose 1 >>>Start taking daily  Continue Singulair daily

## 2017-12-05 NOTE — Assessment & Plan Note (Addendum)
Breo Ellipta 200 >>> Take 1 puff daily in the morning right when you wake up >>>Rinse your mouth out after use >>>This is a daily maintenance inhaler, NOT a rescue inhaler >>>Contact our office if you are having difficulties affording or obtaining this medication >>>It is important for you to be able to take this daily and not miss any doses >>> Sample provided today   Hypersensitivity pneumonitis panel  >>>proceed to lab to to get completed downstairs today  FeNO today >>> 15 this is a normal response  I do not believe that you need a chest x-ray today  Complete repeat CT without contrast in December/2019  You can add daily antihistamine >>>Generic of Zyrtec, Allegra, Claritin-choose 1 >>>Start taking daily  Continue Singulair daily

## 2017-12-05 NOTE — Telephone Encounter (Signed)
[name] only sent over the PFT results on this patient. We need all medical or at least the last office note from patient's last office visit.  Phone number [phone] ext [phone] medial records. Called today left message to call back.

## 2017-12-05 NOTE — Assessment & Plan Note (Signed)
Offered patient treatment for anxiety.  Discussed Atarax and Klonopin.  Patient declined both.  Patient does not feel that he needs anxiety management at this time.  Informed patient to contact our office if he becomes willing to be treated for anxiety.

## 2017-12-06 ENCOUNTER — Other Ambulatory Visit (HOSPITAL_COMMUNITY): Payer: Self-pay | Admitting: Pulmonary Disease

## 2017-12-06 DIAGNOSIS — R1310 Dysphagia, unspecified: Secondary | ICD-10-CM

## 2017-12-09 LAB — HYPERSENSITIVITY PNEUMONITIS
A. Pullulans Abs: NEGATIVE
A.Fumigatus #1 Abs: NEGATIVE
MICROPOLYSPORA FAENI IGG: NEGATIVE
Pigeon Serum Abs: NEGATIVE
THERMOACTINOMYCES VULGARIS IGG: NEGATIVE
Thermoact. Saccharii: NEGATIVE

## 2017-12-13 ENCOUNTER — Ambulatory Visit: Payer: Self-pay | Admitting: *Deleted

## 2017-12-13 ENCOUNTER — Ambulatory Visit (HOSPITAL_COMMUNITY): Payer: PRIVATE HEALTH INSURANCE

## 2017-12-13 ENCOUNTER — Ambulatory Visit (HOSPITAL_COMMUNITY)
Admission: RE | Admit: 2017-12-13 | Discharge: 2017-12-13 | Disposition: A | Discharge status: Home / Self Care | Payer: PRIVATE HEALTH INSURANCE | Source: Ambulatory Visit | Attending: Pulmonary Disease | Admitting: Pulmonary Disease

## 2017-12-13 DIAGNOSIS — R1310 Dysphagia, unspecified: Secondary | ICD-10-CM

## 2017-12-13 NOTE — Telephone Encounter (Signed)
Pt trying to get in contact with [facility] department regarding symptoms. Call transferred to [facility].

## 2017-12-17 ENCOUNTER — Telehealth: Payer: Self-pay | Admitting: Pulmonary Disease

## 2017-12-17 NOTE — Telephone Encounter (Signed)
Called and spoke with patient regarding blood work completed on 11/14/2017 Pt had additional questions on what the blood test were for and the ranges of what was normal range Explained and answered all pt's questions. Pt verbalized understanding, had no further concerns nor questions. Nothing further needed.

## 2017-12-27 ENCOUNTER — Telehealth: Payer: Self-pay | Admitting: Pulmonary Disease

## 2017-12-27 NOTE — Telephone Encounter (Signed)
Called and spoke with patient, he stated that stress still sometimes triggers the symptoms he is wanting to start a prescription for this. Patient states that he did not establish with a primary care per your recommendations last OV.   Aaron Edelman please advise, thank you.

## 2017-12-30 MED ORDER — HYDROXYZINE HCL 50 MG PO TABS: 25.0000 mg | ORAL_TABLET | Freq: Four times a day (QID) | ORAL | 2 refills | Status: DC | PRN

## 2017-12-30 NOTE — Telephone Encounter (Signed)
Spoke with pt. He is aware of Brian's response. Rx has been sent in. Nothing further was needed at this time.

## 2017-12-30 NOTE — Telephone Encounter (Signed)
That is fine.  We can prescribe the patient hydroxyzine 50 mg tablets the following instructions:   Hydroxyzine 50 mg tablet  >>>Can take 0.5 tablet (25mg ) - 2  tablets (100mg ) every 6 hours as needed for anxiety management.  >>> #120 50 mg tablets with 2 refills  Please place the order.   Please explain to the patient that this medication is sedating.  It will make him sleepy.  So I would recommend starting with 0.5 or 1 tablet to see how he handles this dose. Attempt to take medication at home without driving at first. Prior to increasing the dose.    The patient still needs to establish with primary care.  Primary care will be managing chronic anxiety needs if he should have them.  Also if patient is needing stronger medications to help manage his anxiety that will need to come from primary care.  Wyn Quaker FNP

## 2018-01-01 ENCOUNTER — Ambulatory Visit: Payer: PRIVATE HEALTH INSURANCE | Admitting: Pulmonary Disease

## 2018-01-22 ENCOUNTER — Ambulatory Visit: Payer: PRIVATE HEALTH INSURANCE | Admitting: Pulmonary Disease

## 2018-03-04 ENCOUNTER — Inpatient Hospital Stay: Admission: RE | Admit: 2018-03-04 | Payer: PRIVATE HEALTH INSURANCE | Source: Ambulatory Visit

## 2018-03-31 ENCOUNTER — Other Ambulatory Visit: Payer: PRIVATE HEALTH INSURANCE

## 2018-04-10 ENCOUNTER — Ambulatory Visit (INDEPENDENT_AMBULATORY_CARE_PROVIDER_SITE_OTHER)
Admission: RE | Admit: 2018-04-10 | Discharge: 2018-04-10 | Disposition: A | Discharge status: Home / Self Care | Payer: BLUE CROSS/BLUE SHIELD | Source: Ambulatory Visit | Attending: Pulmonary Disease | Admitting: Pulmonary Disease

## 2018-04-10 DIAGNOSIS — R918 Other nonspecific abnormal finding of lung field: Secondary | ICD-10-CM | POA: Diagnosis not present

## 2018-04-17 ENCOUNTER — Telehealth: Payer: Self-pay | Admitting: Pulmonary Disease

## 2018-04-17 NOTE — Telephone Encounter (Signed)
ATC, NA and no option to leave msg 

## 2018-04-18 NOTE — Telephone Encounter (Signed)
I advised the patient that the Ct scan has not yet been read yet I will send Dr.Olalere a message to advise on this.

## 2018-04-18 NOTE — Telephone Encounter (Signed)
CT scan of the chest reviewed  Previous nodule did improve Evidence of bronchiectasis-dilated small breathing tubes Needs monitoring No acute treatment indicated at present Will see him as follow-up

## 2018-04-18 NOTE — Telephone Encounter (Signed)
Patient is aware of results and apt made for 05/06/18. Nothing further needed.

## 2018-05-06 ENCOUNTER — Ambulatory Visit: Payer: BLUE CROSS/BLUE SHIELD | Admitting: Pulmonary Disease

## 2018-05-21 ENCOUNTER — Encounter: Payer: Self-pay | Admitting: Pulmonary Disease

## 2018-05-21 ENCOUNTER — Ambulatory Visit: Payer: BLUE CROSS/BLUE SHIELD | Admitting: Pulmonary Disease

## 2018-05-21 DIAGNOSIS — J439 Emphysema, unspecified: Secondary | ICD-10-CM | POA: Diagnosis not present

## 2018-05-21 NOTE — Progress Notes (Signed)
KEONA SHEFFLER    [id number]    November 28, 1955  Primary Care Physician:Patient, No Pcp Per  Referring Physician: No referring provider defined for this encounter.  Chief complaint:   Shortness of breath only occurring at rest Not limited with activities Not feeling acutely ill  HPI: Symptoms have remained stable Shortness of breath, feeling like he cannot take a deep breath only happens at rest Not really coughing or bringing up any secretions No significant changes in his health generally  Shortness of breath that started acutely Woke up from sleep with not been able to breathe He did see a physician at [facility], was referred to the pulmonologist, but was placed on inhalers, PFT performed Followed up in the emergency department secondary to acute shortness of breath, was placed on steroids He did smoke in the past 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 albuterol (PROVENTIL HFA;VENTOLIN HFA) 108 (90 Base) MCG/ACT inhaler Inhale 2 puffs into the lungs every 6 (six) hours as needed for wheezing or shortness of breath.  . budesonide-formoterol (SYMBICORT) 160-4.5 MCG/ACT inhaler Inhale 2 puffs into the lungs 2 (two) times daily.  . fluticasone furoate-vilanterol (BREO ELLIPTA) 200-25 MCG/INH AEPB Inhale 1 puff into the lungs daily. (Patient not taking: Reported on 05/21/2018)  . fluticasone furoate-vilanterol (BREO ELLIPTA) 200-25 MCG/INH AEPB Inhale 1 puff into the lungs daily.  (Patient not taking: Reported on 05/21/2018)  . hydrOXYzine (ATARAX/VISTARIL) 50 MG tablet Take 0.5-2 tablets (25-100 mg total) by mouth every 6 (six) hours as needed. (Patient not taking: Reported on 05/21/2018)  . montelukast (SINGULAIR) 10 MG tablet Take 1 tablet (10 mg total) by mouth at bedtime. (Patient not taking: Reported on 05/21/2018)  . predniSONE (DELTASONE) 20 MG tablet Take 2 tablets (40 mg total) by mouth daily. (Patient not taking: Reported on 12/05/2017)   No facility-administered encounter medications on file as of 05/21/2018.     Allergies as of 05/21/2018  . (No Known Allergies)   No past medical history on file.  No contributory family history-was adopted  Social History   Socioeconomic History  . Marital status: Significant Other    Spouse name: Not on file  . Number of children: Not on file  . Years of education: Not on file  . Highest education level: Not on file  Occupational History  . Not on file  Social Needs  . Financial resource strain: Not on file  . Food insecurity:    Worry: Not on file    Inability: Not on file  . Transportation needs:    Medical: Not on file    Non-medical: Not on file  Tobacco Use  . Smoking status: Never Smoker  . Smokeless tobacco: Never Used  Substance and Sexual Activity  . Alcohol use: Yes    Alcohol/week: 42.0 standard drinks    Types: 42 Cans of beer per week    Comment: 6 beers a day  . Drug use: Never  . Sexual activity: Never  Lifestyle  . Physical activity:  Days per week: Not on file    Minutes per session: Not on file  . Stress: Not on file  Relationships  . Social connections:    Talks on phone: Not on file    Gets together: Not on file    Attends religious service: Not on file    Active member of club or organization: Not on file    Attends meetings of clubs or organizations: Not on file    Relationship status: Not on file  . Intimate partner violence:    Fear of current or ex partner: Not on  file    Emotionally abused: Not on file    Physically abused: Not on file    Forced sexual activity: Not on file  Other Topics Concern  . Not on file  Social History Narrative  . Not on file    Review of Systems  Constitutional: Negative.   HENT: Negative.   Eyes: Negative.   Respiratory: Positive for chest tightness and shortness of breath.   Cardiovascular: Negative.  Negative for chest pain and leg swelling.  Gastrointestinal: Negative.   Endocrine: Negative.   Psychiatric/Behavioral: Positive for agitation.  All other systems reviewed and are negative.   Vitals:   05/21/18 1016  BP: (!) 144/76  Pulse: 69  SpO2: 95%     Physical Exam  Constitutional: He appears well-developed and well-nourished.  HENT:  Head: Normocephalic and atraumatic.  Eyes: Pupils are equal, round, and reactive to light. Conjunctivae and EOM are normal. Right eye exhibits no discharge. Left eye exhibits no discharge.  Neck: Normal range of motion. Neck supple. No tracheal deviation present. No thyromegaly present.  Cardiovascular: Normal rate and regular rhythm.  Pulmonary/Chest: Breath sounds normal. No respiratory distress. He has no wheezes.  Musculoskeletal: Normal range of motion.  Psychiatric: He has a normal mood and affect.   Data Reviewed: ED records reviewed  I did review some documentation that he had from his recent visit to [facility]   CT scan of the chest reviewed showing evidence of bronchiectasis, previous lung nodule at 2 mm not seen on current CT  Assessment:  .  Shortness of breath -Only occurs at rest  .  Abnormal chest x-ray showing hyperinflated lung fields -  Plan/Recommendations:  .  We will continue bronchodilator treatments   .  Obtain a CT scan of the chest in a year  .  Call with any significant concerns  We will see him back in the office in about a year     [name] MD [facility] 05/21/2018, 10:39 AM  CC: No ref.  provider found

## 2018-05-21 NOTE — Patient Instructions (Signed)
Symptoms at rest  Not limited with activities  Bronchiectasis on CT scan  No changes to be made at present  I will see you back in the office in about a year We will repeat your CT in a year  Call with significant concerns

## 2018-12-16 ENCOUNTER — Encounter: Payer: Self-pay | Admitting: Gastroenterology

## 2019-01-06 ENCOUNTER — Other Ambulatory Visit: Payer: Self-pay | Admitting: Urology

## 2019-01-07 MED ORDER — GEMCITABINE CHEMO FOR BLADDER INSTILLATION 2000 MG: 2000.0000 mg | Freq: Once | INTRAVENOUS | Status: DC

## 2019-01-12 ENCOUNTER — Ambulatory Visit (AMBULATORY_SURGERY_CENTER): Payer: Commercial Managed Care - HMO

## 2019-01-12 ENCOUNTER — Other Ambulatory Visit: Payer: Self-pay

## 2019-01-12 VITALS — Temp 96.9°F | Ht 72.0 in | Wt 195.0 lb

## 2019-01-12 DIAGNOSIS — Z1211 Encounter for screening for malignant neoplasm of colon: Secondary | ICD-10-CM

## 2019-01-12 MED ORDER — NA SULFATE-K SULFATE-MG SULF 17.5-3.13-1.6 GM/177ML PO SOLN: 1.0000 | Freq: Once | ORAL | 0 refills | Status: AC

## 2019-01-12 NOTE — Progress Notes (Signed)
Denies allergies to eggs or soy products. Denies complication of anesthesia or sedation. Denies use of weight loss medication. Denies use of O2.   Emmi instructions given for colonoscopy.   Covid Screening is scheduled for 01/21/19 @ 10:00 Am. Patient verbalizes understanding of instructions.

## 2019-01-13 ENCOUNTER — Telehealth: Payer: Self-pay | Admitting: Gastroenterology

## 2019-01-13 NOTE — Telephone Encounter (Signed)
Pt called and wanted new instructions for a RS colon to 11-12- he has a transurethral resection of a bladder tumor 11-2- pt states MD said ? Cancer- I explained that he may have to have abd pressure for a colon and he needs to RS colon after he is healed from the above surgery- he will have to get OK from surgeon when, 8-12 weeks post op- pt verbalized understanding of this and I canceled the 11-12 colon and his 10-28 COVID test - pt will CB when he is able to RS per the surgeon Lelan Pons pV

## 2019-01-13 NOTE — Telephone Encounter (Signed)
Pt has rescheduled his colonoscopy to 02/05/2019 at 11:00 AM.  Please resend him new instructions.

## 2019-01-22 ENCOUNTER — Inpatient Hospital Stay (HOSPITAL_COMMUNITY): Admission: RE | Admit: 2019-01-22 | Payer: BLUE CROSS/BLUE SHIELD | Source: Ambulatory Visit

## 2019-01-23 ENCOUNTER — Other Ambulatory Visit (HOSPITAL_COMMUNITY)
Admission: RE | Admit: 2019-01-23 | Discharge: 2019-01-23 | Disposition: A | Discharge status: Home / Self Care | Payer: Medicare HMO | Source: Ambulatory Visit | Attending: Urology | Admitting: Urology

## 2019-01-23 ENCOUNTER — Other Ambulatory Visit: Payer: Self-pay

## 2019-01-23 ENCOUNTER — Encounter (HOSPITAL_BASED_OUTPATIENT_CLINIC_OR_DEPARTMENT_OTHER): Payer: Self-pay | Admitting: *Deleted

## 2019-01-23 DIAGNOSIS — Z20828 Contact with and (suspected) exposure to other viral communicable diseases: Secondary | ICD-10-CM | POA: Diagnosis not present

## 2019-01-23 DIAGNOSIS — Z01812 Encounter for preprocedural laboratory examination: Secondary | ICD-10-CM | POA: Insufficient documentation

## 2019-01-23 NOTE — Progress Notes (Signed)
Spoke w/ via phone for pre-op interview--- PT Lab needs dos---- None COVID test ------ 01-23-2019 Arrive at ------- 0530 NPO after ------ MN Medications to take morning of surgery ----- NONE Diabetic medication ----- n/a Patient Special Instructions ----- n/a Pre-Op special Istructions ----- n/a Patient verbalized understanding of instructions that were given at this phone interview. Patient denies shortness of breath, chest pain, fever, cough a this phone interview.

## 2019-01-24 LAB — NOVEL CORONAVIRUS, NAA (HOSP ORDER, SEND-OUT TO REF LAB; TAT 18-24 HRS): SARS-CoV-2, NAA: NOT DETECTED

## 2019-01-25 NOTE — H&P (Signed)
H&P  Chief Complaint: Bladder tumor  History of Present Illness: 63 yo male presents for TURBT/gemcitabine for mgmt of a bladder tumor discovered on eval of gross hematuria.  Past Medical History:  Diagnosis Date  . Adopted    per pt unknown family medical history  . Bladder tumor   . Mild heartburn    occasionally due to certain foods    Past Surgical History:  Procedure Laterality Date  . NO PAST SURGERIES      Home Medications:  Allergies as of 01/25/2019   No Known Allergies     Medication List    Notice   Cannot display discharge medications because the patient has not yet been admitted.     Allergies: No Known Allergies  Family History  Problem Relation Age of Onset  . Colon cancer Neg Hx   . Esophageal cancer Neg Hx   . Rectal cancer Neg Hx   . Stomach cancer Neg Hx     Social History:  reports that he quit smoking about 47 years ago. His smoking use included cigarettes. He quit after 2.00 years of use. He has never used smokeless tobacco. He reports current alcohol use of about 42.0 standard drinks of alcohol per week. He reports that he does not use drugs.  ROS: A complete review of systems was performed.  All systems are negative except for pertinent findings as noted.  Physical Exam:  Vital signs in last 24 hours:   Constitutional:  Alert and oriented, No acute distress Cardiovascular: Regular rate  Respiratory: Normal respiratory effort GI: Abdomen is soft, nontender, nondistended, no abdominal masses. No CVAT.  Genitourinary: Normal male phallus, testes are descended bilaterally and non-tender and without masses, scrotum is normal in appearance without lesions or masses, perineum is normal on inspection. Lymphatic: No lymphadenopathy Neurologic: Grossly intact, no focal deficits Psychiatric: Normal mood and affect  Laboratory Data:  No results for input(s): WBC, HGB, HCT, PLT in the last 72 hours.  No results for input(s): NA, K, CL, GLUCOSE,  BUN, CALCIUM, CREATININE in the last 72 hours.  Invalid input(s): CO3   No results found for this or any previous visit (from the past 24 hour(s)). Recent Results (from the past 240 hour(s))  Novel Coronavirus, NAA (Hosp order, Send-out to Ref Lab; TAT 18-24 hrs     Status: None   Collection Time: 01/23/19 10:35 AM   Specimen: Nasopharyngeal Swab; Respiratory  Result Value Ref Range Status   SARS-CoV-2, NAA NOT DETECTED NOT DETECTED Final    Comment: (NOTE) This nucleic acid amplification test was developed and its performance characteristics determined by [employer]. Nucleic acid amplification tests include PCR and TMA. This test has not been FDA cleared or approved. This test has been authorized by FDA under an Emergency Use Authorization (EUA). This test is only authorized for the duration of time the declaration that circumstances exist justifying the authorization of the emergency use of in vitro diagnostic tests for detection of SARS-CoV-2 virus and/or diagnosis of COVID-19 infection under section 564(b)(1) of the Act, 21 U.S.C. [id number]) (1), unless the authorization is terminated or revoked sooner. When diagnostic testing is negative, the possibility of a false negative result should be considered in the context of a patient's recent exposures and the presence of clinical signs and symptoms consistent with COVID-19. An individual without symptoms of COVID- 19 and who is not shedding SARS-CoV-2 vi rus would expect to have a negative (not detected) result in this assay. Performed At: [facility]  [facility] [address] [place], [place] [id number] Rush Farmer MD [id number]    Coronavirus Source NASOPHARYNGEAL  Final    Comment: Performed at [facility], [place] [address] [place], [place] [address]    Renal Function: No results for input(s): CREATININE in the last 168 hours. CrCl cannot be calculated (Patient's most recent lab result is older than  the maximum 21 days allowed.).  Radiologic Imaging: No results found.  Impression/Assessment:  Bladder tumor  Plan:  TURBT/gemcitabine.

## 2019-01-26 ENCOUNTER — Encounter (HOSPITAL_BASED_OUTPATIENT_CLINIC_OR_DEPARTMENT_OTHER): Payer: Self-pay | Admitting: *Deleted

## 2019-01-26 ENCOUNTER — Encounter: Payer: BLUE CROSS/BLUE SHIELD | Admitting: Gastroenterology

## 2019-01-26 ENCOUNTER — Ambulatory Visit (HOSPITAL_BASED_OUTPATIENT_CLINIC_OR_DEPARTMENT_OTHER)
Admission: RE | Admit: 2019-01-26 | Discharge: 2019-01-26 | Disposition: A | Discharge status: Home / Self Care | Payer: Medicare HMO | Attending: Urology | Admitting: Urology

## 2019-01-26 ENCOUNTER — Encounter (HOSPITAL_BASED_OUTPATIENT_CLINIC_OR_DEPARTMENT_OTHER)
Admission: RE | Disposition: A | Discharge status: Home / Self Care | Payer: Self-pay | Source: Home / Self Care | Attending: Urology

## 2019-01-26 ENCOUNTER — Other Ambulatory Visit: Payer: Self-pay

## 2019-01-26 ENCOUNTER — Ambulatory Visit (HOSPITAL_BASED_OUTPATIENT_CLINIC_OR_DEPARTMENT_OTHER): Payer: Medicare HMO | Admitting: Anesthesiology

## 2019-01-26 DIAGNOSIS — C676 Malignant neoplasm of ureteric orifice: Secondary | ICD-10-CM | POA: Insufficient documentation

## 2019-01-26 DIAGNOSIS — C675 Malignant neoplasm of bladder neck: Secondary | ICD-10-CM | POA: Diagnosis not present

## 2019-01-26 DIAGNOSIS — R310 Gross hematuria: Secondary | ICD-10-CM | POA: Insufficient documentation

## 2019-01-26 DIAGNOSIS — C670 Malignant neoplasm of trigone of bladder: Secondary | ICD-10-CM | POA: Insufficient documentation

## 2019-01-26 DIAGNOSIS — Z87891 Personal history of nicotine dependence: Secondary | ICD-10-CM | POA: Insufficient documentation

## 2019-01-26 DIAGNOSIS — D494 Neoplasm of unspecified behavior of bladder: Secondary | ICD-10-CM | POA: Diagnosis present

## 2019-01-26 HISTORY — DX: Neoplasm of unspecified behavior of bladder: D49.4

## 2019-01-26 HISTORY — DX: Heartburn: R12

## 2019-01-26 HISTORY — DX: Other specified health status: Z78.9

## 2019-01-26 HISTORY — PX: TRANSURETHRAL RESECTION OF BLADDER TUMOR WITH MITOMYCIN-C: SHX6459

## 2019-01-26 HISTORY — DX: Encounter for adoption services: Z02.82

## 2019-01-26 SURGERY — TRANSURETHRAL RESECTION OF BLADDER TUMOR WITH MITOMYCIN-C
Anesthesia: General | Site: Bladder

## 2019-01-26 MED ORDER — OXYCODONE HCL 5 MG PO TABS
5.0000 mg | ORAL_TABLET | Freq: Once | ORAL | Status: AC | PRN
  Administered 2019-01-26: 5 mg via ORAL
  Filled 2019-01-26: qty 1

## 2019-01-26 MED ORDER — CEFAZOLIN SODIUM-DEXTROSE 2-4 GM/100ML-% IV SOLN
2.0000 g | INTRAVENOUS | Status: AC
  Administered 2019-01-26: 08:00:00 2 g via INTRAVENOUS
  Filled 2019-01-26: qty 100

## 2019-01-26 MED ORDER — PROPOFOL 10 MG/ML IV BOLUS
INTRAVENOUS | Status: DC | PRN
  Administered 2019-01-26: 200 mg via INTRAVENOUS

## 2019-01-26 MED ORDER — PROMETHAZINE HCL 25 MG/ML IJ SOLN
6.2500 mg | INTRAMUSCULAR | Status: DC | PRN
  Filled 2019-01-26: qty 1

## 2019-01-26 MED ORDER — ONDANSETRON 4 MG PO TBDP
ORAL_TABLET | ORAL | Status: AC
  Filled 2019-01-26: qty 1

## 2019-01-26 MED ORDER — LIDOCAINE 2% (20 MG/ML) 5 ML SYRINGE
INTRAMUSCULAR | Status: DC | PRN
  Administered 2019-01-26: 100 mg via INTRAVENOUS

## 2019-01-26 MED ORDER — LIDOCAINE 2% (20 MG/ML) 5 ML SYRINGE
INTRAMUSCULAR | Status: AC
  Filled 2019-01-26: qty 5

## 2019-01-26 MED ORDER — CEPHALEXIN 500 MG PO CAPS: 500.0000 mg | ORAL_CAPSULE | Freq: Two times a day (BID) | ORAL | 0 refills | Status: AC

## 2019-01-26 MED ORDER — OXYCODONE HCL 5 MG/5ML PO SOLN
5.0000 mg | Freq: Once | ORAL | Status: AC | PRN
  Filled 2019-01-26: qty 5

## 2019-01-26 MED ORDER — OXYBUTYNIN CHLORIDE 5 MG PO TABS: 5.0000 mg | ORAL_TABLET | Freq: Three times a day (TID) | ORAL | 1 refills | Status: DC | PRN

## 2019-01-26 MED ORDER — LACTATED RINGERS IV SOLN
INTRAVENOUS | Status: DC
  Administered 2019-01-26 (×2): via INTRAVENOUS
  Filled 2019-01-26: qty 1000

## 2019-01-26 MED ORDER — ONDANSETRON HCL 4 MG/2ML IJ SOLN
INTRAMUSCULAR | Status: AC
  Filled 2019-01-26: qty 2

## 2019-01-26 MED ORDER — ONDANSETRON HCL 4 MG/2ML IJ SOLN
INTRAMUSCULAR | Status: DC | PRN
  Administered 2019-01-26: 4 mg via INTRAVENOUS

## 2019-01-26 MED ORDER — OXYCODONE HCL 5 MG PO TABS
ORAL_TABLET | ORAL | Status: AC
  Filled 2019-01-26: qty 1

## 2019-01-26 MED ORDER — PROPOFOL 10 MG/ML IV BOLUS
INTRAVENOUS | Status: AC
  Filled 2019-01-26: qty 40

## 2019-01-26 MED ORDER — ARTIFICIAL TEARS OPHTHALMIC OINT
TOPICAL_OINTMENT | OPHTHALMIC | Status: AC
  Filled 2019-01-26: qty 3.5

## 2019-01-26 MED ORDER — DEXAMETHASONE SODIUM PHOSPHATE 10 MG/ML IJ SOLN
INTRAMUSCULAR | Status: AC
  Filled 2019-01-26: qty 1

## 2019-01-26 MED ORDER — FENTANYL CITRATE (PF) 100 MCG/2ML IJ SOLN
INTRAMUSCULAR | Status: AC
  Filled 2019-01-26: qty 2

## 2019-01-26 MED ORDER — ONDANSETRON 4 MG PO TBDP
4.0000 mg | ORAL_TABLET | Freq: Once | ORAL | Status: AC
  Administered 2019-01-26: 4 mg via ORAL
  Filled 2019-01-26: qty 1

## 2019-01-26 MED ORDER — CEFAZOLIN SODIUM-DEXTROSE 2-4 GM/100ML-% IV SOLN
INTRAVENOUS | Status: AC
  Filled 2019-01-26: qty 100

## 2019-01-26 MED ORDER — OXYBUTYNIN CHLORIDE 5 MG PO TABS
ORAL_TABLET | ORAL | Status: AC
  Filled 2019-01-26: qty 1

## 2019-01-26 MED ORDER — MIDAZOLAM HCL 5 MG/5ML IJ SOLN
INTRAMUSCULAR | Status: DC | PRN
  Administered 2019-01-26: 2 mg via INTRAVENOUS

## 2019-01-26 MED ORDER — GEMCITABINE CHEMO FOR BLADDER INSTILLATION 2000 MG
2000.0000 mg | Freq: Once | INTRAVENOUS | Status: AC
  Administered 2019-01-26: 2000 mg via INTRAVESICAL
  Filled 2019-01-26: qty 2000

## 2019-01-26 MED ORDER — SODIUM CHLORIDE 0.9 % IR SOLN
Status: DC | PRN
  Administered 2019-01-26: 12000 mL via INTRAVESICAL

## 2019-01-26 MED ORDER — OXYBUTYNIN CHLORIDE 5 MG PO TABS
5.0000 mg | ORAL_TABLET | Freq: Once | ORAL | Status: AC
  Administered 2019-01-26: 11:00:00 5 mg via ORAL
  Filled 2019-01-26: qty 1

## 2019-01-26 MED ORDER — FENTANYL CITRATE (PF) 100 MCG/2ML IJ SOLN
25.0000 ug | INTRAMUSCULAR | Status: DC | PRN
  Administered 2019-01-26 (×3): 25 ug via INTRAVENOUS
  Filled 2019-01-26: qty 1

## 2019-01-26 MED ORDER — DEXAMETHASONE SODIUM PHOSPHATE 4 MG/ML IJ SOLN
INTRAMUSCULAR | Status: DC | PRN
  Administered 2019-01-26: 10 mg via INTRAVENOUS

## 2019-01-26 MED ORDER — MIDAZOLAM HCL 2 MG/2ML IJ SOLN
INTRAMUSCULAR | Status: AC
  Filled 2019-01-26: qty 2

## 2019-01-26 MED ORDER — KETOROLAC TROMETHAMINE 30 MG/ML IJ SOLN
30.0000 mg | Freq: Once | INTRAMUSCULAR | Status: DC | PRN
  Filled 2019-01-26: qty 1

## 2019-01-26 MED ORDER — FENTANYL CITRATE (PF) 100 MCG/2ML IJ SOLN
INTRAMUSCULAR | Status: DC | PRN
  Administered 2019-01-26: 50 ug via INTRAVENOUS
  Administered 2019-01-26: 25 ug via INTRAVENOUS
  Administered 2019-01-26: 50 ug via INTRAVENOUS
  Administered 2019-01-26: 25 ug via INTRAVENOUS
  Administered 2019-01-26: 50 ug via INTRAVENOUS

## 2019-01-26 SURGICAL SUPPLY — 30 items
BAG DRAIN URO-CYSTO SKYTR STRL (DRAIN) ×3 IMPLANT
BAG URINE DRAINAGE (UROLOGICAL SUPPLIES) IMPLANT
BAG URINE LEG 500ML (DRAIN) ×3 IMPLANT
CATH FOLEY 2WAY SLVR  5CC 20FR (CATHETERS) ×2
CATH FOLEY 2WAY SLVR  5CC 22FR (CATHETERS)
CATH FOLEY 2WAY SLVR 5CC 20FR (CATHETERS) ×1 IMPLANT
CATH FOLEY 2WAY SLVR 5CC 22FR (CATHETERS) IMPLANT
CATH FOLEY 3WAY 30CC 22F (CATHETERS) IMPLANT
CATH INTERMIT  6FR 70CM (CATHETERS) ×3 IMPLANT
CLOTH BEACON ORANGE TIMEOUT ST (SAFETY) IMPLANT
ELECT REM PT RETURN 9FT ADLT (ELECTROSURGICAL)
ELECTRODE REM PT RTRN 9FT ADLT (ELECTROSURGICAL) IMPLANT
EVACUATOR MICROVAS BLADDER (UROLOGICAL SUPPLIES) IMPLANT
GLOVE BIO SURGEON STRL SZ8 (GLOVE) ×3 IMPLANT
GOWN STRL REUS W/TWL XL LVL3 (GOWN DISPOSABLE) ×3 IMPLANT
GUIDEWIRE STR DUAL SENSOR (WIRE) ×3 IMPLANT
HOLDER FOLEY CATH W/STRAP (MISCELLANEOUS) ×3 IMPLANT
IV NS IRRIG 3000ML ARTHROMATIC (IV SOLUTION) ×9 IMPLANT
KIT TURNOVER CYSTO (KITS) ×3 IMPLANT
LOOP CUT BIPOLAR 24F LRG (ELECTROSURGICAL) ×3 IMPLANT
MANIFOLD NEPTUNE II (INSTRUMENTS) ×3 IMPLANT
NS IRRIG 500ML POUR BTL (IV SOLUTION) ×3 IMPLANT
PACK CYSTO (CUSTOM PROCEDURE TRAY) ×3 IMPLANT
PLUG CATH AND CAP STER (CATHETERS) ×3 IMPLANT
STENT URET 6FRX26 CONTOUR (STENTS) ×3 IMPLANT
SYRINGE IRR TOOMEY STRL 70CC (SYRINGE) ×6 IMPLANT
TUBE CONNECTING 12'X1/4 (SUCTIONS) ×1
TUBE CONNECTING 12X1/4 (SUCTIONS) ×2 IMPLANT
TUBING UROLOGY SET (TUBING) ×3 IMPLANT
WATER STERILE IRR 500ML POUR (IV SOLUTION) IMPLANT

## 2019-01-26 NOTE — Op Note (Signed)
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  Upon presentation he had cystoscopy and CT hematuria protocol.  Cystoscopy revealed right trigonal bladder tumor approximately 3 cm in size.  I did not see the small bladder neck tumor at that time.  CT revealed no evident upper tract lesions, no extravesical disease.  He presents at this time for cystoscopy, TURBT, placement of gemcitabine postoperatively.  He understands the procedure, risks, complications as well as the need for placement of a catheter postoperatively.  He desires to proceed.  Findings: Urethra was normal.  At the bladder neck anteriorly there was approximate 1 cm papillary tumor at the 12 o'clock position.  The only other urothelial abnormality was a large bladder tumor encroaching the right ureteral orifice superiorly and medially.  This extended superior and medially in the trigonal region.  No other urothelial lesions were noted.  Description of procedure: The patient was properly identified in the holding area.  He received preoperative IV antibiotics.  Was taken to the operating room where general anesthetic was administered with the LMA.  He is placed in the dorsolithotomy position.  Genitalia and perineum were prepped and draped.  Proper timeout was performed.  The patient's urethral meatus was dilated to 30 [place] with [employer] sounds.  Following this, the 75 French resectoscope sheath was advanced  using the visual obturator.  At this point, the bladder neck lesion was seen.  Additionally, the trigonal lesion was seen.  The ureteral orifice was quite close to the tumor.  For this reason, a 6 [place] open-ended catheter was placed in the right ureter and advanced proximally.  Following this, the resectoscope and cutting loop were placed.  Initially, the tumor in the trigonal region was resected.  Resection was taken down just to the superior edge of the ureteral orifice.  I took care not to resect the orifice, but did remove all viable tumor from this area.  Resection was then carried out superiorly and medially down to the muscle layer.  The tumor was quite vascular, but was not broad-based.  Following resection of the entire tumor, which I felt was easily resected into the muscle layer, careful electrocoagulation was performed both to the ureteral orifice area as well as the tumor base until hemostasis was achieved.  At this point, the bladder neck lesion was resected, and following this the tumor base was cauterized.  There was excellent hemostasis at this point.  Bladder tumor fragments were irrigated from the bladder.  The scope was then removed.  I then passed the cystoscope over top of the open-ended catheter, deciding at this point to leave a stent in the right ureter temporarily.  The guidewire was passed into the renal pelvis, the open-ended catheter removed, and a 26 cm x 6 French contour double-J stent was placed with excellent deployment after the guidewire was removed.  The bladder was drained.  The scope removed.  The tether was brought out through the urethral meatus and taped to the penis.  A 20 French Foley catheter, 10 cc balloon  was then placed.  Balloon filled with 10 cc.  At this point, the procedure was terminated.  The patient was awakened and taken to the PACU in stable condition.  In the PACU, 2 g of gemcitabine was left indwelling for an hour and then drained.

## 2019-01-26 NOTE — Anesthesia Postprocedure Evaluation (Signed)
Anesthesia Post Note  Patient: Billy Hernandez  Procedure(s) Performed: TRANSURETHRAL RESECTION OF BLADDER TUMOR WITH MITOMYCIN-C (N/A Bladder)     Patient location during evaluation: PACU Anesthesia Type: General Level of consciousness: awake and alert Pain management: pain level controlled Vital Signs Assessment: post-procedure vital signs reviewed and stable Respiratory status: spontaneous breathing, nonlabored ventilation, respiratory function stable and patient connected to nasal cannula oxygen Cardiovascular status: blood pressure returned to baseline and stable Postop Assessment: no apparent nausea or vomiting Anesthetic complications: no    Last Vitals:  Vitals:   01/26/19 0930 01/26/19 0945  BP: (!) 145/75 (!) 162/90  Pulse: 66 62  Resp: 12 14  Temp:    SpO2: 95% 97%    Last Pain:  Vitals:   01/26/19 0930  TempSrc:   PainSc: 0-No pain                 Kasaundra Fahrney S

## 2019-01-26 NOTE — Transfer of Care (Signed)
    Last Vitals:  Vitals Value Taken Time  BP 152/87 01/26/19 0831  Temp 36.5 C 01/26/19 0831  Pulse 69 01/26/19 0832  Resp 13 01/26/19 0832  SpO2 100 % 01/26/19 0832  Vitals shown include unvalidated device data.  Last Pain:  Vitals:   01/26/19 0604  TempSrc: Oral  PainSc: 0-No pain      Patients Stated Pain Goal: 4 (01/26/19 0604)  Immediate Anesthesia Transfer of Care Note  Patient: Billy Hernandez  Procedure(s) Performed: Procedure(s) (LRB): TRANSURETHRAL RESECTION OF BLADDER TUMOR WITH MITOMYCIN-C (N/A)  Patient Location: PACU  Anesthesia Type: General  Level of Consciousness: awake, alert  and oriented  Airway & Oxygen Therapy: Patient Spontanous Breathing and Patient connected to nasal cannula oxygen  Post-op Assessment: Report given to PACU RN and Post -op Vital signs reviewed and stable  Post vital signs: Reviewed and stable  Complications: No apparent anesthesia complications

## 2019-01-26 NOTE — Discharge Instructions (Signed)
1. You may see some blood in the urine and may have some burning with urination for 48-72 hours. You also may notice that you have to urinate more frequently or urgently after your procedure which is normal.  2. You should call should you develop an inability urinate, fever > 101, persistent nausea and vomiting that prevents you from eating or drinking to stay hydrated.  3. If you have a stent, you will likely urinate more frequently and urgently until the stent is removed and you may experience some discomfort/pain in the lower abdomen and flank especially when urinating. You may take pain medication prescribed to you if needed for pain. You may also intermittently have blood in the urine until the stent is removed. You can pull the string to remove stent Thur AM (don't pull this when pulling catheter out Tuesday) 4. If you have a catheter, you will be taught how to take care of the catheter by the nursing staff prior to discharge from the hospital.  You may periodically feel a strong urge to void with the catheter in place.  This is a bladder spasm and most often can occur when having a bowel movement or moving around. It is typically self-limited and usually will stop after a few minutes.  You may use some Vaseline or Neosporin around the tip of the catheter to reduce friction at the tip of the penis. You may also see some blood in the urine.  A very small amount of blood can make the urine look quite red.  As long as the catheter is draining well, there usually is not a problem.  However, if the catheter is not draining well and is bloody, you should call the office [phone]) to notify us. OK to remove cathter Tues AM  [facility] [phone] Post Ureteroscopy With or Without Stent Instructions  Definitions:  Ureter: The duct that transports urine from the kidney to the bladder. Stent:   A plastic hollow tube that is placed into the ureter, from the kidney to the                  bladder to prevent the ureter from swelling shut.  GENERAL INSTRUCTIONS:  Despite the fact that no skin incisions were used, the area around the ureter and bladder is raw and irritated. The stent is a foreign body which will further irritate the bladder wall. This irritation is manifested by increased frequency of urination, both day and night, and by an increase in the urge to urinate. In some, the urge to urinate is present almost always. Sometimes the urge is strong enough that you may not be able to stop yourself from urinating. The only real cure is to remove the stent and then give time for the bladder wall to heal which can't be done until the danger of the ureter swelling shut has passed, which varies.  You may see some blood in your urine while the stent is in place and a few days afterwards. Do not be alarmed, even if the urine was clear for a while. Get off your feet and drink lots of fluids until clearing occurs. If you start to pass clots or don't improve, call us.  DIET: You may return to your normal diet immediately. Because of the raw surface of your bladder, alcohol, spicy foods, acid type foods and drinks with caffeine may cause irritation or frequency and should be used in moderation. To keep your urine flowing freely and to  avoid constipation, drink plenty of fluids during the day ( 8-10 glasses ). Tip: Avoid cranberry juice because it is very acidic.  ACTIVITY: Your physical activity doesn't need to be restricted. However, if you are very active, you may see some blood in your urine. We suggest that you reduce your activity under these circumstances until the bleeding has stopped.  BOWELS: It is important to keep your bowels regular during the postoperative period. Straining with bowel movements can cause bleeding. A bowel movement every other day is reasonable. Use a mild laxative if needed, such as Milk of Magnesia 2-3 tablespoons, or 2 Dulcolax tablets. Call if you continue  to have problems. If you have been taking narcotics for pain, before, during or after your surgery, you may be constipated. Take a laxative if necessary.   MEDICATION: You should resume your pre-surgery medications unless told not to. In addition you will often be given an antibiotic to prevent infection. These should be taken as prescribed until the bottles are finished unless you are having an unusual reaction to one of the drugs.  PROBLEMS YOU SHOULD REPORT TO [place]:  Fevers over 100.5 Fahrenheit.  Heavy bleeding, or clots ( See above notes about blood in urine ).  Inability to urinate.  Drug reactions ( hives, rash, nausea, vomiting, diarrhea ).  Severe burning or pain with urination that is not improving.  FOLLOW-UP: You will need a follow-up appointment to monitor your progress. Call for this appointment at the number listed above. Usually the first appointment will be about three to fourteen days after your surgery.  Indwelling Urinary Catheter Care, Adult An indwelling urinary catheter is a thin tube that is put into your bladder. The tube helps to drain pee (urine) out of your body. The tube goes in through your urethra. Your urethra is where pee comes out of your body. Your pee will come out through the catheter, then it will go into a bag (drainage bag). Take good care of your catheter so it will work well. How to wear your catheter and bag Supplies needed  Sticky tape (adhesive tape) or a leg strap.  Alcohol wipe or soap and water (if you use tape).  A clean towel (if you use tape).  Large overnight bag.  Smaller bag (leg bag). Wearing your catheter Attach your catheter to your leg with tape or a leg strap.  Make sure the catheter is not pulled tight.  If a leg strap gets wet, take it off and put on a dry strap.  If you use tape to hold the bag on your leg: 1. Use an alcohol wipe or soap and water to wash your skin where the tape made it sticky before. 2. Use a  clean towel to pat-dry that skin. 3. Use new tape to make the bag stay on your leg. Wearing your bags You should have been given a large overnight bag.  You may wear the overnight bag in the day or night.  Always have the overnight bag lower than your bladder.  Do not let the bag touch the floor.  Before you go to sleep, put a clean plastic bag in a wastebasket. Then hang the overnight bag inside the wastebasket. You should also have a smaller leg bag that fits under your clothes.  Always wear the leg bag below your knee.  Do not wear your leg bag at night. How to care for your skin and catheter Supplies needed  A clean washcloth.  Water  and mild soap.  A clean towel. Caring for your skin and catheter      Clean the skin around your catheter every day: ? Wash your hands with soap and water. ? Wet a clean washcloth in warm water and mild soap. ? Clean the skin around your urethra. ? If you are male: ? Gently spread the folds of skin around your vagina (labia). ? With the washcloth in your other hand, wipe the inner side of your labia on each side. Wipe from front to back. ? If you are male: ? Pull back any skin that covers the end of your penis (foreskin). ? With the washcloth in your other hand, wipe your penis in small circles. Start wiping at the tip of your penis, then move away from the catheter. ? Move the foreskin back in place, if needed. ? With your free hand, hold the catheter close to where it goes into your body. ? Keep holding the catheter during cleaning so it does not get pulled out. ? With the washcloth in your other hand, clean the catheter. ? Only wipe downward on the catheter. ? Do not wipe upward toward your body. Doing this may push germs into your urethra and cause infection. ? Use a clean towel to pat-dry the catheter and the skin around it. Make sure to wipe off all soap. ? Wash your hands with soap and water.  Shower every day. Do not take  baths.  Do not use cream, ointment, or lotion on the area where the catheter goes into your body, unless your doctor tells you to.  Do not use powders, sprays, or lotions on your genital area.  Check your skin around the catheter every day for signs of infection. Check for: ? Redness, swelling, or pain. ? Fluid or blood. ? Warmth. ? Pus or a bad smell. How to empty the bag Supplies needed  Rubbing alcohol.  Gauze pad or cotton ball.  Tape or a leg strap. Emptying the bag Pour the pee out of your bag when it is ?- full, or at least 2-3 times a day. Do this for your overnight bag and your leg bag. 1. Wash your hands with soap and water. 2. Separate (detach) the bag from your leg. 3. Hold the bag over the toilet or a clean pail. Keep the bag lower than your hips and bladder. This is so the pee (urine) does not go back into the tube. 4. Open the pour spout. It is at the bottom of the bag. 5. Empty the pee into the toilet or pail. Do not let the pour spout touch any surface. 6. Put rubbing alcohol on a gauze pad or cotton ball. 7. Use the gauze pad or cotton ball to clean the pour spout. 8. Close the pour spout. 9. Attach the bag to your leg with tape or a leg strap. 10. Wash your hands with soap and water. Follow instructions for cleaning the drainage bag:  From the product maker.  As told by your doctor. How to change the bag Supplies needed  Alcohol wipes.  A clean bag.  Tape or a leg strap. Changing the bag Replace your bag when it starts to leak, smell bad, or look dirty. 1. Wash your hands with soap and water. 2. Separate the dirty bag from your leg. 3. Pinch the catheter with your fingers so that pee does not spill out. 4. Separate the catheter tube from the bag tube where these tubes connect (  at the connection valve). Do not let the tubes touch any surface. 5. Clean the end of the catheter tube with an alcohol wipe. Use a different alcohol wipe to clean the end  of the bag tube. 6. Connect the catheter tube to the tube of the clean bag. 7. Attach the clean bag to your leg with tape or a leg strap. Do not make the bag tight on your leg. 8. Wash your hands with soap and water. General rules   Never pull on your catheter. Never try to take it out. Doing that can hurt you.  Always wash your hands before and after you touch your catheter or bag. Use a mild, fragrance-free soap. If you do not have soap and water, use hand sanitizer.  Always make sure there are no twists or bends (kinks) in the catheter tube.  Always make sure there are no leaks in the catheter or bag.  Drink enough fluid to keep your pee pale yellow.  Do not take baths, swim, or use a hot tub.  If you are male, wipe from front to back after you poop (have a bowel movement). Contact a doctor if:  Your pee is cloudy.  Your pee smells worse than usual.  Your catheter gets clogged.  Your catheter leaks.  Your bladder feels full. Get help right away if:  You have redness, swelling, or pain where the catheter goes into your body.  You have fluid, blood, pus, or a bad smell coming from the area where the catheter goes into your body.  Your skin feels warm where the catheter goes into your body.  You have a fever.  You have pain in your: ? Belly (abdomen). ? Legs. ? Lower back. ? Bladder.  You see blood in the catheter.  Your pee is pink or red.  You feel sick to your stomach (nauseous).  You throw up (vomit).  You have chills.  Your pee is not draining into the bag.  Your catheter gets pulled out. Summary  An indwelling urinary catheter is a thin tube that is placed into the bladder to help drain pee (urine) out of the body.  The catheter is placed into the part of the body that drains pee from the bladder (urethra).  Taking good care of your catheter will keep it working properly and help prevent problems.  Always wash your hands before and after  touching your catheter or bag.  Never pull on your catheter or try to take it out. This information is not intended to replace advice given to you by your health care provider. Make sure you discuss any questions you have with your health care provider. Document Released: 07/07/2012 Document Revised: 07/04/2018 Document Reviewed: 10/26/2016 Elsevier Patient Education  [place] Instructions  Activity: Get plenty of rest for the remainder of the day. A responsible individual must stay with you for 24 hours following the procedure.  For the next 24 hours, DO NOT: -Drive a car -Paediatric nurse -Drink alcoholic beverages -Take any medication unless instructed by your physician -Make any legal decisions or sign important papers.  Meals: Start with liquid foods such as gelatin or soup. Progress to regular foods as tolerated. Avoid greasy, spicy, heavy foods. If nausea and/or vomiting occur, drink only clear liquids until the nausea and/or vomiting subsides. Call your physician if vomiting continues.  Special Instructions/Symptoms: Your throat may feel dry or sore from the anesthesia or the breathing tube placed  in your throat during surgery. If this causes discomfort, gargle with warm salt water. The discomfort should disappear within 24 hours.  If you had a scopolamine patch placed behind your ear for the management of post- operative nausea and/or vomiting:  1. The medication in the patch is effective for 72 hours, after which it should be removed.  Wrap patch in a tissue and discard in the trash. Wash hands thoroughly with soap and water. 2. You may remove the patch earlier than 72 hours if you experience unpleasant side effects which may include dry mouth, dizziness or visual disturbances. 3. Avoid touching the patch. Wash your hands with soap and water after contact with the patch.

## 2019-01-26 NOTE — Anesthesia Preprocedure Evaluation (Signed)
Anesthesia Evaluation  Patient identified by MRN, date of birth, ID band Patient awake    Reviewed: Allergy & Precautions, NPO status , Patient's Chart, lab work & pertinent test results  Airway Mallampati: II  TM Distance: >3 FB Neck ROM: Full    Dental no notable dental hx.    Pulmonary neg pulmonary ROS, former smoker,    Pulmonary exam normal breath sounds clear to auscultation       Cardiovascular negative cardio ROS Normal cardiovascular exam Rhythm:Regular Rate:Normal     Neuro/Psych negative neurological ROS  negative psych ROS   GI/Hepatic Neg liver ROS,   Endo/Other  negative endocrine ROS  Renal/GU negative Renal ROS  negative genitourinary   Musculoskeletal negative musculoskeletal ROS (+)   Abdominal   Peds negative pediatric ROS (+)  Hematology negative hematology ROS (+)   Anesthesia Other Findings   Reproductive/Obstetrics negative OB ROS                             Anesthesia Physical Anesthesia Plan  ASA: II  Anesthesia Plan: General   Post-op Pain Management:    Induction: Intravenous  PONV Risk Score and Plan: 2 and Dexamethasone, Ondansetron and Treatment may vary due to age or medical condition  Airway Management Planned: LMA and Oral ETT  Additional Equipment:   Intra-op Plan:   Post-operative Plan: Extubation in OR  Informed Consent: I have reviewed the patients History and Physical, chart, labs and discussed the procedure including the risks, benefits and alternatives for the proposed anesthesia with the patient or authorized representative who has indicated his/her understanding and acceptance.     Dental advisory given  Plan Discussed with: CRNA and Surgeon  Anesthesia Plan Comments: (ETT if relaxation  needed)        Anesthesia Quick Evaluation

## 2019-01-26 NOTE — Interval H&P Note (Signed)
History and Physical Interval Note:  01/26/2019 7:27 AM  Billy Hernandez  has presented today for surgery, with the diagnosis of BLADDER TUMOR.  The various methods of treatment have been discussed with the patient and family. After consideration of risks, benefits and other options for treatment, the patient has consented to  Procedure(s): TRANSURETHRAL RESECTION OF BLADDER TUMOR WITH MITOMYCIN-C (N/A) as a surgical intervention.  The patient's history has been reviewed, patient examined, no change in status, stable for surgery.  I have reviewed the patient's chart and labs.  Questions were answered to the patient's satisfaction.     Lillette Boxer Jermiya Reichl

## 2019-01-26 NOTE — Anesthesia Procedure Notes (Signed)
Procedure Name: LMA Insertion Date/Time: 01/26/2019 7:39 AM Performed by: Mechele Claude, CRNA Pre-anesthesia Checklist: Patient identified, Emergency Drugs available, Suction available and Patient being monitored Patient Re-evaluated:Patient Re-evaluated prior to induction Oxygen Delivery Method: Circle system utilized Preoxygenation: Pre-oxygenation with 100% oxygen Induction Type: IV induction Ventilation: Mask ventilation without difficulty LMA: LMA inserted LMA Size: 5.0 Number of attempts: 1 Airway Equipment and Method: Bite block Placement Confirmation: positive ETCO2 Tube secured with: Tape Dental Injury: Teeth and Oropharynx as per pre-operative assessment

## 2019-01-27 ENCOUNTER — Encounter (HOSPITAL_BASED_OUTPATIENT_CLINIC_OR_DEPARTMENT_OTHER): Payer: Self-pay | Admitting: Urology

## 2019-01-27 LAB — SURGICAL PATHOLOGY

## 2019-02-05 ENCOUNTER — Encounter: Payer: BLUE CROSS/BLUE SHIELD | Admitting: Gastroenterology

## 2019-05-22 ENCOUNTER — Ambulatory Visit (INDEPENDENT_AMBULATORY_CARE_PROVIDER_SITE_OTHER)
Admission: RE | Admit: 2019-05-22 | Discharge: 2019-05-22 | Disposition: A | Discharge status: Home / Self Care | Payer: Medicare HMO | Source: Ambulatory Visit | Attending: Pulmonary Disease | Admitting: Pulmonary Disease

## 2019-05-22 ENCOUNTER — Other Ambulatory Visit: Payer: Self-pay

## 2019-05-22 DIAGNOSIS — J439 Emphysema, unspecified: Secondary | ICD-10-CM | POA: Diagnosis not present

## 2019-05-25 ENCOUNTER — Telehealth: Payer: Self-pay

## 2019-05-25 NOTE — Telephone Encounter (Signed)
[facility] calling with a call report on 2/26 CT chest.  Impression copied below, full report available in Epic:  IMPRESSION: Chronic bronchitic changes with stable likely post inflammatory changes in the lingula.  Potential new 6 x 2 mm endobronchial nodule at the origin of the RIGHT mainstem bronchus versus mucous; recommend bronchoscopy assessment to exclude developing tumor.  Aortic Atherosclerosis (ICD10-I70.0).  AO please advise.  Thanks.

## 2019-05-25 NOTE — Telephone Encounter (Signed)
Schedule for physical office visit to review CT and discuss potential bronchoscopy What is described may be as simple as just mucus in the breathing tube or a small nodule in the airway which may require sampling

## 2019-05-25 NOTE — Telephone Encounter (Signed)
Called and spoke with pt to see if I could schedule appt for him to have CT results discussed and he stated that would be fine. Pt has been scheduled an appt 3/8.nothing further needed.

## 2019-06-01 ENCOUNTER — Ambulatory Visit: Payer: Medicare HMO | Admitting: Pulmonary Disease

## 2019-06-08 ENCOUNTER — Other Ambulatory Visit: Payer: Self-pay

## 2019-06-08 ENCOUNTER — Ambulatory Visit: Payer: Medicare HMO | Admitting: Pulmonary Disease

## 2019-06-08 ENCOUNTER — Encounter: Payer: Self-pay | Admitting: Pulmonary Disease

## 2019-06-08 VITALS — BP 138/70 | HR 93 | Temp 97.3°F | Ht 72.0 in | Wt 182.0 lb

## 2019-06-08 DIAGNOSIS — R918 Other nonspecific abnormal finding of lung field: Secondary | ICD-10-CM | POA: Diagnosis not present

## 2019-06-08 DIAGNOSIS — R9389 Abnormal findings on diagnostic imaging of other specified body structures: Secondary | ICD-10-CM

## 2019-06-08 NOTE — Patient Instructions (Signed)
Abnormal CT scan of the chest showing endobronchial lesion -This may be secretions in the airway -The only way of of being sure is a bronchoscopy Think about the procedure as discussed  I will follow-up with you in about 6 months   Flexible Bronchoscopy  Flexible bronchoscopy is a procedure that is used to examine the passageways in the lungs. During the procedure, a thin, flexible tool with a camera on it (bronchoscope) is passed into the mouth or nose, down through the windpipe (trachea), and into the air tubes (bronchi) in the lungs. This tool allows your health care provider to look at your lungs from the inside and take testing (diagnostic) samples if needed. Tell a health care provider about:  Any allergies you have.  All medicines you are taking, including vitamins, herbs, eye drops, creams, and over-the-counter medicines.  Any problems you or family members have had with anesthetic medicines.  Any blood disorders you have.  Any surgeries you have had.  Any medical conditions you have.  Whether you are pregnant or may be pregnant. What are the risks? Generally, this is a safe procedure. However, problems may occur, including:  Infection.  Bleeding.  Damage to other structures or organs.  Allergic reactions to medicines.  Collapsed lung (pneumothorax).  Increased need for oxygen or difficulty breathing after the procedure. What happens before the procedure? Medicines Ask your health care provider about:  Changing or stopping your regular medicines. This is especially important if you are taking diabetes medicines or blood thinners.  Taking medicines such as aspirin and ibuprofen. These medicines can thin your blood. Do not take these medicines before your procedure if your health care provider instructs you not to. You may be given antibiotic medicine to help prevent infection. Staying hydrated Follow instructions from your health care provider about hydration,  which may include:  Up to 2 hours before the procedure - you may continue to drink clear liquids, such as water, clear fruit juice, black coffee, and plain tea. Eating and drinking Follow instructions from your health care provider about eating and drinking, which may include:  8 hours before the procedure - stop eating heavy meals or foods such as meat, fried foods, or fatty foods.  6 hours before the procedure - stop eating light meals or foods, such as toast or cereal.  6 hours before the procedure - stop drinking milk or drinks that contain milk.  2 hours before the procedure - stop drinking clear liquids. General instructions  Plan to have someone take you home from the hospital or clinic.  If you will be going home right after the procedure, plan to have someone with you for 24 hours. What happens during the procedure?  To lower your risk of infection: ? Your health care team will wash or sanitize their hands. ? Your skin will be washed with soap.  An IV tube will be inserted into one of your veins.  You will be given a medicine (local anesthetic) to numb your mouth, nose, throat, and voice box (larynx). You may also be given one or more of the following: ? A medicine to help you relax (sedative). ? A medicine to control coughing. ? A medicine to dry up any fluids in your lungs (secretions).  A bronchoscope will be passed into your nose or mouth, and into your lungs. Your health care provider will examine your lungs.  Samples of airway secretions may be collected for testing.  If abnormal areas are seen in  your airways, tissue samples may be removed for examination under a microscope (biopsy).  If tissue samples are needed from the outer parts of the lung, a type of X-ray (fluoroscopy) may be used to guide the bronchoscope to these areas.  If bleeding occurs, you may be given medicine to stop or decrease the bleeding. The procedure may vary among health care providers  and hospitals. What happens after the procedure?  Do not drive for 24 hours if you were given a sedative.  Your blood pressure, heart rate, breathing rate, and blood oxygen level will be monitored until the medicines you were given have worn off.  You may have a chest X-ray to check for signs of pneumothorax.  You will not be allowed to eat or drink anything for 2 hours after your procedure.  If a biopsy was taken, it is up to you to get the results of your procedure. Ask your health care provider, or the department that is doing the procedure, when your results will be ready. Summary  Flexible bronchoscopy is a procedure that allows your health care provider to look closely at your lungs from the inside and take testing (diagnostic) samples if needed.  Risks of flexible bronchoscopy include bleeding, infection, and pneumothorax.  Before a flexible bronchoscopy, you will be given a medicine (local anesthetic) to numb your mouth, nose, throat, and voice box (larynx). Then, a bronchoscope will be passed into your nose or mouth, and into your lungs.  After the procedure, your blood pressure, heart rate, breathing rate, and blood oxygen level will be monitored until the medicines you were given have worn off. You may have a chest X-ray to check for signs of pneumothorax.  You will not be allowed to eat or drink anything for 2 hours after your procedure. This information is not intended to replace advice given to you by your health care provider. Make sure you discuss any questions you have with your health care provider. Document Revised: 02/22/2017 Document Reviewed: 04/14/2016 Elsevier Patient Education  2020 [employer].

## 2019-06-08 NOTE — Progress Notes (Signed)
Billy Hernandez    [id number]    September 08, 1955  Primary Care Physician:Okwubunka-Anyim, Jimmy Picket, MD  Referring Physician: No referring provider defined for this encounter.  Chief complaint:   Follow-up of shortness of breath Follow-up of abnormal CT scan of the chest  HPI: Symptoms have improved significantly since last visit He did have some exacerbation of his symptoms prior to obtaining his most recent CT Did have some congestion  Most recent CT showing possible endobronchial lesion This may be related to secretions that he had as he stated he was having a lot of congestion at the time CT was performed  Breathing is largely gone back to usual  Has no other ongoing respiratory problems  Previous history significant for Shortness of breath that started acutely Woke up from sleep with not been able to breathe He did see a physician at [facility], was referred to the pulmonologist, but was placed on inhalers, PFT performed Followed up in the emergency department secondary to acute shortness of breath, was placed on steroids He did smoke in the past 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 [DISCONTINUED] ibuprofen (ADVIL) 200 MG tablet Take 200 mg by mouth every 6 (six) hours as needed.  . [DISCONTINUED] oxybutynin (DITROPAN) 5 MG tablet Take 1 tablet (5 mg total) by mouth every 8 (eight) hours as needed for up to 15 doses for bladder spasms.   No facility-administered  encounter medications on file as of 06/08/2019.    Allergies as of 06/08/2019  . (No Known Allergies)   Past Medical History:  Diagnosis Date  . Adopted    per pt unknown family medical history  . Bladder tumor   . Mild heartburn    occasionally due to certain foods    No contributory family history-was adopted  Social History   Socioeconomic History  . Marital status: Significant Other    Spouse name: Not on file  . Number of children: Not on file  . Years of education: Not on file  . Highest education level: Not on file  Occupational History  . Not on file  Tobacco Use  . Smoking status: Former Smoker    Years: 2.00    Types: Cigarettes    Quit date: 01/23/1972    Years since quitting: 47.4  . Smokeless tobacco: Never Used  Substance and Sexual Activity  . Alcohol use: Yes    Alcohol/week: 42.0 standard drinks    Types: 42 Cans of beer per week    Comment: 4-6 beers a day  . Drug use: Never  . Sexual activity: Not on file    Comment: vasectomy  Other Topics Concern  . Not on file  Social History Narrative  . Not on file   Social Determinants of Health   Financial Resource Strain:   . Difficulty of Paying Living Expenses:   Food Insecurity:   . Worried About Charity fundraiser in the Last Year:   . Arboriculturist in the Last Year:   [employer]  Needs:   . Lack of Transportation (Medical):   Marland Kitchen Lack of Transportation (Non-Medical):   Physical Activity:   . Days of Exercise per Week:   . Minutes of Exercise per Session:   Stress:   . Feeling of Stress :   Social Connections:   . Frequency of Communication with Friends and Family:   . Frequency of Social Gatherings with Friends and Family:   . Attends Religious Services:   . Active Member of Clubs or Organizations:   . Attends Archivist Meetings:   Marland Kitchen Marital Status:   Intimate Partner Violence:   . Fear of Current or Ex-Partner:   . Emotionally Abused:   Marland Kitchen Physically Abused:   .  Sexually Abused:     Review of Systems  Constitutional: Negative.   HENT: Negative.   Eyes: Negative.   Respiratory: Negative for chest tightness and shortness of breath.   Cardiovascular: Negative.  Negative for chest pain and leg swelling.  Gastrointestinal: Negative.   Endocrine: Negative.   Psychiatric/Behavioral: Positive for agitation.  All other systems reviewed and are negative.   Vitals:   06/08/19 1212  BP: 138/70  Pulse: 93  Temp: (!) 97.3 F (36.3 C)  SpO2: 95%     Physical Exam  Constitutional: He appears well-developed and well-nourished.  HENT:  Head: Normocephalic and atraumatic.  Pulmonary/Chest: Breath sounds normal.  Musculoskeletal:        General: Normal range of motion.  Psychiatric: He has a normal mood and affect.   Data Reviewed:  CT scan of the chest was reviewed with the patient showing the endobronchial lesion at the origin of the right main bronchus, was not present on the CT that was performed here earlier  Assessment:  .  Shortness of breath -Improved -Symptoms back to baseline  Abnormal CT scan of the chest showing an endobronchial lesion -  Plan/Recommendations:  .  Did discuss the lesion .  Possibly may be secretions in the airway  .  The only way to be sure of what it is is to do a visual inspection-bronchoscopy to ascertain its not a growth  .  He feels well at present  .  He will think about the procedure and discuss it with spouse  Encouraged to give [place] a call to let me know what he wants [place] to do  Tentatively we will schedule for follow-up appointment in 6 months  [name] MD [facility] 06/08/2019, 12:42 PM  CC: No ref. provider found

## 2019-06-12 ENCOUNTER — Encounter: Payer: Self-pay | Admitting: Gastroenterology

## 2019-07-01 ENCOUNTER — Ambulatory Visit (AMBULATORY_SURGERY_CENTER): Payer: Self-pay

## 2019-07-01 ENCOUNTER — Other Ambulatory Visit: Payer: Self-pay

## 2019-07-01 VITALS — Temp 97.1°F | Ht 72.0 in | Wt 191.0 lb

## 2019-07-01 DIAGNOSIS — Z01818 Encounter for other preprocedural examination: Secondary | ICD-10-CM

## 2019-07-01 DIAGNOSIS — Z1211 Encounter for screening for malignant neoplasm of colon: Secondary | ICD-10-CM

## 2019-07-01 MED ORDER — NA SULFATE-K SULFATE-MG SULF 17.5-3.13-1.6 GM/177ML PO SOLN: 1.0000 | Freq: Once | ORAL | 0 refills | Status: AC

## 2019-07-01 NOTE — Progress Notes (Signed)
No allergies to soy or egg Pt is not on blood thinners or diet pills Denies issues with sedation/intubation Denies atrial flutter/fib Denies constipation   Is on suppliments (vitamins)  Does not know what they are.  Discussed iron and fiber restrictions in case that is what his supplements involves.  Emmi instructions given to pt  Pt is aware of Covid safety and care partner requirements.

## 2019-07-09 ENCOUNTER — Other Ambulatory Visit: Payer: Self-pay | Admitting: Gastroenterology

## 2019-07-09 DIAGNOSIS — Z8616 Personal history of COVID-19: Secondary | ICD-10-CM

## 2019-07-09 HISTORY — DX: Personal history of COVID-19: Z86.16

## 2019-07-10 ENCOUNTER — Telehealth: Payer: Self-pay

## 2019-07-10 ENCOUNTER — Telehealth: Payer: Self-pay | Admitting: Gastroenterology

## 2019-07-10 LAB — SARS CORONAVIRUS 2 (TAT 6-24 HRS): SARS Coronavirus 2: POSITIVE — AB

## 2019-07-10 NOTE — Telephone Encounter (Signed)
Patient has seen his SARS Coronavirus 2  is positive. He called to ask questions. Call was dropped with transfer. Called patient and he was confused. He was unsure if his SARS Coronavirus2 was that same as COVID-19. We discussed that this this does mean he is positive for COVID-19 and he can pass the germ to others.  He states he has had cold /allergie symptoms but that he feels good now. Symptom tiers and treatment plan for each were read to patient.  Criteria for ending isolation were read to patient. Good preventative practices were discussed. Quarantine criteria for other household members were reviewed. Patient verbalized understanding. [place] will be notified

## 2019-07-10 NOTE — Telephone Encounter (Signed)
Arbie Cookey from [facility]agment [facility] is calling- she states that they had a voicemail from patient stating that he was positive for his COVID test and wanted some clairfcation on it and when he could r/s hes colonoscopy. She wanted to pass the message on to [place] so we could call the patient.

## 2019-07-10 NOTE — Telephone Encounter (Signed)
Pt tested positive for covid. Colon scheduled 4/21 in the [facility]. Appt cancelled, pt instructed to contact his PCP regarding this diagnosis. Let him know to contact our office when he is 14 days out from his last symptom and we can then get him rescheduled for the colon. Dr. Tarri Glenn aware.

## 2019-07-10 NOTE — Telephone Encounter (Signed)
Pt called to get clarification on covid test results. Will have nurse call him back.   [place]

## 2019-07-10 NOTE — Telephone Encounter (Signed)
Phone call to [facility]; spoke with Caryl Pina.  Informed of pt. Calling the [facility]  And requesting a call back to get clarification on COVID test results.  Per Caryl Pina, she will inform Dr. Tarri Glenn' nurse of the above.

## 2019-07-11 ENCOUNTER — Telehealth: Payer: Self-pay | Admitting: Adult Health

## 2019-07-11 NOTE — Telephone Encounter (Signed)
Called to discuss with Billy Hernandez about Covid symptoms and the use of bamlanivimab, a monoclonal antibody infusion for those with mild to moderate Covid symptoms and at a high risk of hospitalization.    Pt does not qualify for infusion therapy as he  has asymptomatic infection. Isolation precautions discussed. Advised to contact back for consideration should he  develop symptoms. Patient verbalized understanding.      Patient Active Problem List   Diagnosis Date Noted  . Dyspnea 12/05/2017  . Trouble swallowing 12/05/2017  . Healthcare maintenance 12/05/2017  . Allergic rhinitis 12/05/2017  . Feeling anxious 12/05/2017   Billy Bushong NP-C  [facility]    07/11/2019

## 2019-07-13 NOTE — Telephone Encounter (Signed)
Thank you for the update!

## 2019-07-15 ENCOUNTER — Encounter: Payer: Medicare HMO | Admitting: Gastroenterology

## 2019-07-30 ENCOUNTER — Ambulatory Visit: Payer: Medicare HMO | Attending: Internal Medicine

## 2019-07-30 DIAGNOSIS — Z23 Encounter for immunization: Secondary | ICD-10-CM

## 2019-07-30 NOTE — Progress Notes (Signed)
   Covid-19 Vaccination Clinic  Name:  Billy Hernandez    MRN: [id number] DOB: 1955/12/17  07/30/2019  [name] was observed post Covid-19 immunization for 15 minutes without incident. He was provided with Vaccine Information Sheet and instruction to access the V-Safe system.   [name] was instructed to call 911 with any severe reactions post vaccine: Marland Kitchen Difficulty breathing  . Swelling of face and throat  . A fast heartbeat  . A bad rash all over body  . Dizziness and weakness   Immunizations Administered    Name Date Dose VIS Date Route   Pfizer COVID-19 Vaccine 07/30/2019  1:15 PM 0.3 mL 05/20/2018 Intramuscular   Manufacturer: [place]   Lot: [id number]   [place]: [id number]

## 2019-08-27 ENCOUNTER — Encounter: Payer: Self-pay | Admitting: Gastroenterology

## 2019-10-16 ENCOUNTER — Encounter: Payer: Self-pay | Admitting: Gastroenterology

## 2019-10-16 ENCOUNTER — Ambulatory Visit: Payer: Medicare HMO | Admitting: Gastroenterology

## 2019-10-16 VITALS — BP 160/90 | HR 83 | Ht 72.0 in | Wt 180.0 lb

## 2019-10-16 DIAGNOSIS — R1310 Dysphagia, unspecified: Secondary | ICD-10-CM | POA: Diagnosis not present

## 2019-10-16 DIAGNOSIS — Z1211 Encounter for screening for malignant neoplasm of colon: Secondary | ICD-10-CM | POA: Diagnosis not present

## 2019-10-16 MED ORDER — PANTOPRAZOLE SODIUM 40 MG PO TBEC: 40.0000 mg | DELAYED_RELEASE_TABLET | Freq: Every day | ORAL | 3 refills | Status: DC

## 2019-10-16 MED ORDER — SUPREP BOWEL PREP KIT 17.5-3.13-1.6 GM/177ML PO SOLN: 1.0000 | ORAL | 0 refills | Status: DC

## 2019-10-16 NOTE — Patient Instructions (Addendum)
I have recommended a colonoscopy and upper endoscopy for further evaluation of your symptoms. I will review with Dr. Ander Slade for clearance for anesthesia prior to the procedures.   Tips for colonoscopy:  - Stay well hydrated for 3-4 days prior to the exam. This reduces nausea and dehydration.  - To prevent skin/hemorrhoid irritation - prior to wiping, put A&Dointment or vaseline on the toilet paper. - Keep a towel or pad on the bed.  - Drink  64oz of clear liquids in the morning of prep day (prior to starting the prep) to be sure that there is enough fluid to flush the colon and stay hydrated!!!! This is in addition to the fluids required for preparation. - Use of a flavored hard candy, such as grape Anise Salvo, can counteract some of the flavor of the prep and may prevent some nausea.   We will send clearance to Dr. Candace Cruise regarding endoscopies. We will contact you once we have clearance.   If you are age 75 or older, your body mass index should be between 23-30. Your Body mass index is 24.41 kg/m. If this is out of the aforementioned range listed, please consider follow up with your Primary Care Provider.  If you are age 73 or younger, your body mass index should be between 19-25. Your Body mass index is 24.41 kg/m. If this is out of the aformentioned range listed, please consider follow up with your Primary Care Provider.   We have sent the following medications to your pharmacy for you to pick up at your convenience: Suprep, Pantoprazole   Please start Pantoprazole 40mg  - once daily.   You have been scheduled for an endoscopy and colonoscopy. Please follow the written instructions given to you at your visit today. Please pick up your prep supplies at the pharmacy within the next 1-3 days. If you use inhalers (even only as needed), please bring them with you on the day of your procedure.  Thank you for choosing me and [facility].  Dr.Nixie Laube

## 2019-10-16 NOTE — Progress Notes (Signed)
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 There is no component of bloating or constipation.  There are case reports of shortness of breath as the only manifestation of gastric cancer.   PLAN: Trial of pantoprazole 40 mg QAM EGD after pulmonary clearance given his ongoing shortness of breath and recent COVID infection Screening Colonoscopy  Please see the "Patient Instructions" section for addition details about the plan.  HPI: Billy Hernandez is a 64 y.o. male self- referred for shortness of breath. The history is obtained through the patient and review of his electronic health record. He has anxiety, allergic rhinitis, bladder cancer treated with BCG beads.  Followed by Dr. Ander Slade for shortness of breath that originally developed in 2019. At the time of his last visit with Dr. Ander Slade his shortness of breath had improved and his symptoms were back to baseline.  Had an abnormal CT scan showing an endobronchial lesion. Symptoms also evaluated by ENT in 2019 who told him symptoms could be related to his GI system due to reported dysphagia to dry foods. I've reviewed the laryngoscopy that shows some interarytenoid edema and polyps on the left side of his nose.   He was then scheduled for open access colonoscopy in April 2021, but this was cancelled when preprocedure testing diagnosed asymptomatic COVID. He then completed the COVID vaccine in May. No significant change in shortness of breath since that time.   When shortness of breath started, he could only eat small bites of food throughout the day or it triggered his  shortness of breath.  He finds that now his shortness of breath worsens when he is hungry. No other symptom associated with his GI system.   No odynophagia. Some dysphagia with dry foods like rice at the xiphoid notch.  No heartburn, reflux, regurgitation, sore throat, throat pain, dysphonia. Weight stable. Appetite is good.   No known family history of colon cancer or polyps. No family history of uterine/endometrial cancer, pancreatic cancer or gastric/stomach cancer.   Past Medical History:  Diagnosis Date  . Adopted    per pt unknown family medical history  . Allergy    seasonal allergies  . Bladder tumor   . Cancer [facility])    bladder cancer now resolved.  . Mild heartburn    occasionally due to certain foods    Past Surgical History:  Procedure Laterality Date  . NO PAST SURGERIES    . TRANSURETHRAL RESECTION OF BLADDER TUMOR WITH MITOMYCIN-C N/A 01/26/2019   Procedure: TRANSURETHRAL RESECTION OF BLADDER TUMOR WITH MITOMYCIN-C;  Surgeon: Franchot Gallo, MD;  Location: [facility];  Service: Urology;  Laterality: N/A;    No current outpatient medications on file.   No current facility-administered medications for this visit.    Allergies as of 10/16/2019  . (No Known Allergies)    Family History  Problem Relation Age of Onset  . Colon cancer Neg Hx   . Esophageal cancer Neg Hx   . Rectal cancer Neg Hx   . Stomach cancer Neg Hx   . Colon polyps Neg Hx     Social History   Socioeconomic History  . Marital status:  Significant Other    Spouse name: Not on file  . Number of children: Not on file  . Years of education: Not on file  . Highest education level: Not on file  Occupational History  . Not on file  Tobacco Use  . Smoking status: Former Smoker    Years: 2.00    Types: Cigarettes    Quit date: 01/23/1972    Years since quitting: 47.7  . Smokeless tobacco: Never Used  Vaping Use  . Vaping Use: Former  Substance and Sexual Activity    . Alcohol use: Yes    Alcohol/week: 42.0 standard drinks    Types: 42 Cans of beer per week    Comment: 4-6 beers a day  . Drug use: Never  . Sexual activity: Not on file    Comment: vasectomy  Other Topics Concern  . Not on file  Social History Narrative  . Not on file   Social Determinants of Health   Financial Resource Strain:   . Difficulty of Paying Living Expenses:   Food Insecurity:   . Worried About Charity fundraiser in the Last Year:   . Arboriculturist in the Last Year:   Transportation Needs:   . Film/video editor (Medical):   Marland Kitchen Lack of Transportation (Non-Medical):   Physical Activity:   . Days of Exercise per Week:   . Minutes of Exercise per Session:   Stress:   . Feeling of Stress :   Social Connections:   . Frequency of Communication with Friends and Family:   . Frequency of Social Gatherings with Friends and Family:   . Attends Religious Services:   . Active Member of Clubs or Organizations:   . Attends Archivist Meetings:   Marland Kitchen Marital Status:   Intimate Partner Violence:   . Fear of Current or Ex-Partner:   . Emotionally Abused:   Marland Kitchen Physically Abused:   . Sexually Abused:     Review of Systems: 12 system ROS is negative except as noted above.   Physical Exam: General:   Alert,  well-nourished, pleasant and cooperative in NAD Head:  Normocephalic and atraumatic. Eyes:  Sclera clear, no icterus.   Conjunctiva pink. Ears:  Normal auditory acuity. Nose:  No deformity, discharge,  or lesions. Mouth:  No deformity or lesions.   Neck:  Supple; no masses or thyromegaly. Lungs:  Clear throughout to auscultation.   No wheezes. Heart:  Regular rate and rhythm; no murmurs. Abdomen:  Soft,nontender, nondistended, normal bowel sounds, no rebound or guarding. No hepatosplenomegaly.   Rectal:  Deferred  Msk:  Symmetrical. No boney deformities LAD: No inguinal or umbilical LAD Extremities:  No clubbing or edema. Neurologic:  Alert and   oriented x4;  grossly nonfocal Skin:  Intact without significant lesions or rashes. Psych:  Alert and cooperative. Normal mood and affect.     Leonidus Rowand L. Tarri Glenn, MD, MPH 10/16/2019, 9:26 AM

## 2019-10-18 ENCOUNTER — Encounter: Payer: Self-pay | Admitting: Gastroenterology

## 2019-11-10 ENCOUNTER — Telehealth: Payer: Self-pay

## 2019-11-10 NOTE — Telephone Encounter (Signed)
  Clearance Letter Routed and Faxed to Dr. Ander Slade office at [phone].

## 2019-11-12 ENCOUNTER — Telehealth: Payer: Self-pay | Admitting: Pulmonary Disease

## 2019-11-12 NOTE — Telephone Encounter (Signed)
ATC patient , uses a google answering service, no vm able to be left.   Dr. Ander Slade do you want this follow up with you or NP

## 2019-11-12 NOTE — Telephone Encounter (Signed)
I have not seen patient in 5 months Has no call with any symptoms  Shortness of breath was getting better when I last saw him  Has no underlying chronic health problems  His shortness of breath may be related to recent Covid infection  He does have an endobronchial lesion that needs follow-up with bronchoscopy, lesion had been present and then not visualized on another CT before being seen on the most recent CT  Visual inspection of the airway is recommended  Regarding clearance for colonoscopy  No underlying lung disease to preclude colonoscopy from my perspective

## 2019-11-16 NOTE — Telephone Encounter (Signed)
Can follow-up with me or nurse practitioner  Significant issue is to follow-up about this endobronchial process-he may still elect not to have anything done which he did in the past

## 2019-11-16 NOTE — Telephone Encounter (Signed)
Called and spoke with patient, appointment scheduled with Rexene Edison NP for 11/19/19 at 3:30.  Nothing further needed.

## 2019-11-19 ENCOUNTER — Telehealth: Payer: Self-pay | Admitting: Adult Health

## 2019-11-19 ENCOUNTER — Encounter: Payer: Self-pay | Admitting: Adult Health

## 2019-11-19 ENCOUNTER — Ambulatory Visit: Payer: Medicare HMO | Admitting: Adult Health

## 2019-11-19 ENCOUNTER — Other Ambulatory Visit: Payer: Self-pay

## 2019-11-19 VITALS — BP 150/86 | HR 58 | Temp 97.3°F | Ht 72.0 in | Wt 181.0 lb

## 2019-11-19 DIAGNOSIS — R911 Solitary pulmonary nodule: Secondary | ICD-10-CM

## 2019-11-19 DIAGNOSIS — R918 Other nonspecific abnormal finding of lung field: Secondary | ICD-10-CM | POA: Insufficient documentation

## 2019-11-19 DIAGNOSIS — J479 Bronchiectasis, uncomplicated: Secondary | ICD-10-CM | POA: Diagnosis not present

## 2019-11-19 MED ORDER — ALBUTEROL SULFATE HFA 108 (90 BASE) MCG/ACT IN AERS: 1.0000 | INHALATION_SPRAY | Freq: Four times a day (QID) | RESPIRATORY_TRACT | 1 refills | Status: DC | PRN

## 2019-11-19 NOTE — H&P (View-Only) (Signed)
@Patient  ID: Billy Hernandez, male    DOB: 1955-09-30, 64 y.o.   MRN: [id number]  Chief Complaint  Patient presents with   Follow-up    ct chest     Referring provider: Buzzy Han*  HPI: 64 year old male former smoker followed for shortness of breath and abnormal CT chest  TEST/EVENTS :  CT chest 04/2019 Chronic bronchitic changes with stable likely post inflammatory changes in the lingula.  Potential new 6 x 2 mm endobronchial nodule at the origin of the RIGHT mainstem bronchus versus mucous; recommend bronchoscopy assessment to exclude developing tumor.  Spirometry 10/2017 Normal spirometry .   11/19/2019 Follow up : Abnormal CT chest  Patient returns for a 61-monthfollow-up.  Patient was seen last visit at that time CT chest showed chronic bronchitic changes with stable postinflammatory changes of the lingula.  A potential new 6 x 2 mm endobronchial nodule at the origin of the right mainstem bronchus.  At that time case was discussed with patient regarding need to proceed with a bronchoscopy.  Patient returns today and says that he is ready to undergo bronchoscopy. Patient denies any hemoptysis or weight loss.  Patient denies any anticoagulation no aspirin. Patient says he is very active.  Works in [employer]  Does get short of breath with activity this been present since 2019.  Has minimal smoking history.  Previous CT showed some bronchiectasis along the lingular area. No significant cough .  Had covid 19 infection April 2021 , asymptomatic .   No Known Allergies  Immunization History  Administered Date(s) Administered   PFIZER SARS-COV-2 Vaccination 07/30/2019    Past Medical History:  Diagnosis Date   Adopted    per pt unknown family medical history   Allergy    seasonal allergies   Bladder tumor    [place]    bladder cancer now resolved.   Mild heartburn    occasionally due to certain foods    Tobacco History: Social History    Tobacco Use  Smoking Status Former Smoker   Years: 2.00   Types: Cigarettes   Quit date: 01/23/1972   Years since quitting: 47.8  Smokeless Tobacco Never Used   Counseling given: Not Answered   Outpatient Medications Prior to Visit  Medication Sig Dispense Refill   Na Sulfate-K Sulfate-Mg Sulf (SUPREP BOWEL PREP KIT) 17.5-3.13-1.6 GM/177ML SOLN Take 1 kit by mouth as directed. For colonoscopy prep (Patient not taking: Reported on 11/19/2019) 354 mL 0   pantoprazole (PROTONIX) 40 MG tablet Take 1 tablet (40 mg total) by mouth daily. (Patient not taking: Reported on 11/19/2019) 30 tablet 3   No facility-administered medications prior to visit.     Review of Systems:   Constitutional:   No  weight loss, night sweats,  Fevers, chills, fatigue, or  lassitude.  HEENT:   No headaches,  Difficulty swallowing,  Tooth/dental problems, or  Sore throat,                No sneezing, itching, ear ache, nasal congestion, post nasal drip,   CV:  No chest pain,  Orthopnea, PND, swelling in lower extremities, anasarca, dizziness, palpitations, syncope.   GI  No heartburn, indigestion, abdominal pain, nausea, vomiting, diarrhea, change in bowel habits, loss of appetite, bloody stools.   Resp: No shortness of breath with exertion or at rest.  No excess mucus, no productive cough,  No non-productive cough,  No coughing up of blood.  No change in color of mucus.  No  wheezing.  No chest wall deformity  Skin: no rash or lesions.  GU: no dysuria, change in color of urine, no urgency or frequency.  No flank pain, no hematuria   MS:  No joint pain or swelling.  No decreased range of motion.  No back pain.    Physical Exam  BP (!) 150/86 (BP Location: Left Arm, Cuff Size: Normal)    Pulse (!) 58    Temp (!) 97.3 F (36.3 C) (Temporal)    Ht 6' (1.829 m)    Wt 181 lb (82.1 kg)    SpO2 99%    BMI 24.55 kg/m   GEN: A/Ox3; pleasant , NAD, well nourished    HEENT:  [place]/AT,  EACs-clear, TMs-wnl,  NOSE-clear, THROAT-clear, no lesions, no postnasal drip or exudate noted.   NECK:  Supple w/ fair ROM; no JVD; normal carotid impulses w/o bruits; no thyromegaly or nodules palpated; no lymphadenopathy.    RESP  Clear  P & A; w/o, wheezes/ rales/ or rhonchi. no accessory muscle use, no dullness to percussion  CARD:  RRR, no m/r/g, no peripheral edema, pulses intact, no cyanosis or clubbing.  GI:   Soft & nt; nml bowel sounds; no organomegaly or masses detected.   Musco: Warm bil, no deformities or joint swelling noted.   Neuro: alert, no focal deficits noted.    Skin: Warm, no lesions or rashes    Lab Results:  CBC  ProBNP No results found for: PROBNP  Imaging: No results found.    No flowsheet data found.  Lab Results  Component Value Date   NITRICOXIDE 15 12/05/2017        Assessment & Plan:   Lung nodule Abnormal CT chest with a 6 x 2 mm endobronchial lesion noted along the right mainstem bronchus.  Case was discussed previously with Dr. Hermina Staggers and patient with recommendations to proceed with bronchoscopy to visualize airway.  Went over bronchoscopy procedure.  Case discussed with Dr. Ander Slade will proceed with bronchoscopy.  Labs today.  Patient education on potential risk for bronchoscopy.  Patient questions answered.   Plan  Patient Instructions  Set up for Bronchoscopy Dr. Ander Slade  Labs today .  Albuterol Inhaler 1-2 puffs every 6hrs as needed for shortness of breath .  Follow up with Dr. Hermina Staggers in 6 weeks and As needed   Please contact office for sooner follow up if symptoms do not improve or worsen or seek emergency care        Bronchiectasis without complication ([place]) Stable without flare  Albuterol As needed        Rexene Edison, NP 11/19/2019

## 2019-11-19 NOTE — Patient Instructions (Addendum)
Set up for Bronchoscopy Dr. Ander Slade  Labs today .  Albuterol Inhaler 1-2 puffs every 6hrs as needed for shortness of breath .  Follow up with Dr. Hermina Staggers in 6 weeks and As needed   Please contact office for sooner follow up if symptoms do not improve or worsen or seek emergency care

## 2019-11-19 NOTE — Telephone Encounter (Signed)
Please schedule the following: Dr Ander Slade   Diagnosis: Endo bronchial Lesion  Procedure: Flexible bronchoscopy with BAL+ endobronchial biopsy  Anesthesia: Moderate sedation  Do you need Fluro : No  Priority: w/n next week  Date: week of 8/30-9/3 Alternate Date:  Time: AM  Location: [place]  Does patient have OSA? No  DM? No  Or Latex allergy?no  Medication Restriction: nkda  Anticoagulate/Antiplatelet: no  Pre-op Labs Ordered: CBC, CMP, PT/INR, PTT Imaging request: no  (If, SuperDimension CT Chest, please have STAT courier sent to [facility].)  Please coordinate Pre-op COVID Testing  Has been vaccinated.

## 2019-11-19 NOTE — Assessment & Plan Note (Signed)
Abnormal CT chest with a 6 x 2 mm endobronchial lesion noted along the right mainstem bronchus.  Case was discussed previously with Dr. Hermina Staggers and patient with recommendations to proceed with bronchoscopy to visualize airway.  Went over bronchoscopy procedure.  Case discussed with Dr. Ander Slade will proceed with bronchoscopy.  Labs today.  Patient education on potential risk for bronchoscopy.  Patient questions answered.   Plan  Patient Instructions  Set up for Bronchoscopy Dr. Ander Slade  Labs today .  Albuterol Inhaler 1-2 puffs every 6hrs as needed for shortness of breath .  Follow up with Dr. Hermina Staggers in 6 weeks and As needed   Please contact office for sooner follow up if symptoms do not improve or worsen or seek emergency care

## 2019-11-19 NOTE — Progress Notes (Signed)
@Patient  ID: Billy Hernandez, male    DOB: 1956/01/25, 64 y.o.   MRN: [id number]  Chief Complaint  Patient presents with  . Follow-up    ct chest     Referring provider: Buzzy Han*  HPI: 64 year old male former smoker followed for shortness of breath and abnormal CT chest  TEST/EVENTS :  CT chest 04/2019 Chronic bronchitic changes with stable likely post inflammatory changes in the lingula.  Potential new 6 x 2 mm endobronchial nodule at the origin of the RIGHT mainstem bronchus versus mucous; recommend bronchoscopy assessment to exclude developing tumor.  Spirometry 10/2017 Normal spirometry .   11/19/2019 Follow up : Abnormal CT chest  Patient returns for a 2-monthfollow-up.  Patient was seen last visit at that time CT chest showed chronic bronchitic changes with stable postinflammatory changes of the lingula.  A potential new 6 x 2 mm endobronchial nodule at the origin of the right mainstem bronchus.  At that time case was discussed with patient regarding need to proceed with a bronchoscopy.  Patient returns today and says that he is ready to undergo bronchoscopy. Patient denies any hemoptysis or weight loss.  Patient denies any anticoagulation no aspirin. Patient says he is very active.  Works in [employer]  Does get short of breath with activity this been present since 2019.  Has minimal smoking history.  Previous CT showed some bronchiectasis along the lingular area. No significant cough .  Had covid 19 infection April 2021 , asymptomatic .   No Known Allergies  Immunization History  Administered Date(s) Administered  . PFIZER SARS-COV-2 Vaccination 07/30/2019    Past Medical History:  Diagnosis Date  . Adopted    per pt unknown family medical history  . Allergy    seasonal allergies  . Bladder tumor   . Cancer ([facility]    bladder cancer now resolved.  . Mild heartburn    occasionally due to certain foods    Tobacco History: Social History    Tobacco Use  Smoking Status Former Smoker  . Years: 2.00  . Types: Cigarettes  . Quit date: 01/23/1972  . Years since quitting: 47.8  Smokeless Tobacco Never Used   Counseling given: Not Answered   Outpatient Medications Prior to Visit  Medication Sig Dispense Refill  . Na Sulfate-K Sulfate-Mg Sulf (SUPREP BOWEL PREP KIT) 17.5-3.13-1.6 GM/177ML SOLN Take 1 kit by mouth as directed. For colonoscopy prep (Patient not taking: Reported on 11/19/2019) 354 mL 0  . pantoprazole (PROTONIX) 40 MG tablet Take 1 tablet (40 mg total) by mouth daily. (Patient not taking: Reported on 11/19/2019) 30 tablet 3   No facility-administered medications prior to visit.     Review of Systems:   Constitutional:   No  weight loss, night sweats,  Fevers, chills, fatigue, or  lassitude.  HEENT:   No headaches,  Difficulty swallowing,  Tooth/dental problems, or  Sore throat,                No sneezing, itching, ear ache, nasal congestion, post nasal drip,   CV:  No chest pain,  Orthopnea, PND, swelling in lower extremities, anasarca, dizziness, palpitations, syncope.   GI  No heartburn, indigestion, abdominal pain, nausea, vomiting, diarrhea, change in bowel habits, loss of appetite, bloody stools.   Resp: No shortness of breath with exertion or at rest.  No excess mucus, no productive cough,  No non-productive cough,  No coughing up of blood.  No change in color of mucus.  No  wheezing.  No chest wall deformity  Skin: no rash or lesions.  GU: no dysuria, change in color of urine, no urgency or frequency.  No flank pain, no hematuria   MS:  No joint pain or swelling.  No decreased range of motion.  No back pain.    Physical Exam  BP (!) 150/86 (BP Location: Left Arm, Cuff Size: Normal)   Pulse (!) 58   Temp (!) 97.3 F (36.3 C) (Temporal)   Ht 6' (1.829 m)   Wt 181 lb (82.1 kg)   SpO2 99%   BMI 24.55 kg/m   GEN: A/Ox3; pleasant , NAD, well nourished    HEENT:  [place]/AT,  EACs-clear, TMs-wnl,  NOSE-clear, THROAT-clear, no lesions, no postnasal drip or exudate noted.   NECK:  Supple w/ fair ROM; no JVD; normal carotid impulses w/o bruits; no thyromegaly or nodules palpated; no lymphadenopathy.    RESP  Clear  P & A; w/o, wheezes/ rales/ or rhonchi. no accessory muscle use, no dullness to percussion  CARD:  RRR, no m/r/g, no peripheral edema, pulses intact, no cyanosis or clubbing.  GI:   Soft & nt; nml bowel sounds; no organomegaly or masses detected.   Musco: Warm bil, no deformities or joint swelling noted.   Neuro: alert, no focal deficits noted.    Skin: Warm, no lesions or rashes    Lab Results:  CBC  ProBNP No results found for: PROBNP  Imaging: No results found.    No flowsheet data found.  Lab Results  Component Value Date   NITRICOXIDE 15 12/05/2017        Assessment & Plan:   Lung nodule Abnormal CT chest with a 6 x 2 mm endobronchial lesion noted along the right mainstem bronchus.  Case was discussed previously with Dr. Hermina Staggers and patient with recommendations to proceed with bronchoscopy to visualize airway.  Went over bronchoscopy procedure.  Case discussed with Dr. Ander Slade will proceed with bronchoscopy.  Labs today.  Patient education on potential risk for bronchoscopy.  Patient questions answered.   Plan  Patient Instructions  Set up for Bronchoscopy Dr. Ander Slade  Labs today .  Albuterol Inhaler 1-2 puffs every 6hrs as needed for shortness of breath .  Follow up with Dr. Hermina Staggers in 6 weeks and As needed   Please contact office for sooner follow up if symptoms do not improve or worsen or seek emergency care        Bronchiectasis without complication ([place]) Stable without flare  Albuterol As needed        Rexene Edison, NP 11/19/2019

## 2019-11-19 NOTE — Assessment & Plan Note (Signed)
Stable without flare  Albuterol As needed

## 2019-11-19 NOTE — Telephone Encounter (Signed)
Okay to go ahead with scheduling bronchoscopy  Case discussed with Tammy Parrett

## 2019-11-20 ENCOUNTER — Other Ambulatory Visit: Payer: Self-pay

## 2019-11-20 ENCOUNTER — Other Ambulatory Visit (HOSPITAL_COMMUNITY)
Admission: RE | Admit: 2019-11-20 | Discharge: 2019-11-20 | Disposition: A | Discharge status: Home / Self Care | Payer: Medicare HMO | Source: Ambulatory Visit | Attending: Pulmonary Disease | Admitting: Pulmonary Disease

## 2019-11-20 DIAGNOSIS — Z01812 Encounter for preprocedural laboratory examination: Secondary | ICD-10-CM | POA: Insufficient documentation

## 2019-11-20 DIAGNOSIS — Z20822 Contact with and (suspected) exposure to covid-19: Secondary | ICD-10-CM | POA: Diagnosis not present

## 2019-11-20 LAB — CBC
HCT: 39.3 % (ref 39.0–52.0)
Hemoglobin: 13.5 g/dL (ref 13.0–17.0)
MCHC: 34.2 g/dL (ref 30.0–36.0)
MCV: 97.3 fl (ref 78.0–100.0)
Platelets: 318 10*3/uL (ref 150.0–400.0)
RBC: 4.04 Mil/uL — ABNORMAL LOW (ref 4.22–5.81)
RDW: 12.4 % (ref 11.5–15.5)
WBC: 5.7 10*3/uL (ref 4.0–10.5)

## 2019-11-20 LAB — SARS CORONAVIRUS 2 (TAT 6-24 HRS): SARS Coronavirus 2: NEGATIVE

## 2019-11-20 LAB — BASIC METABOLIC PANEL
BUN: 6 mg/dL (ref 6–23)
CO2: 26 mEq/L (ref 19–32)
Calcium: 9.2 mg/dL (ref 8.4–10.5)
Chloride: 95 mEq/L — ABNORMAL LOW (ref 96–112)
Creatinine, Ser: 0.66 mg/dL (ref 0.40–1.50)
GFR: 120.77 mL/min (ref >60.00)
Glucose, Bld: 79 mg/dL (ref 70–99)
Potassium: 4.1 mEq/L (ref 3.5–5.1)
Sodium: 129 mEq/L — ABNORMAL LOW (ref 135–145)

## 2019-11-20 LAB — PROTIME-INR
INR: 1 ratio (ref 0.8–1.0)
Prothrombin Time: 11.3 s (ref 9.6–13.1)

## 2019-11-20 NOTE — Telephone Encounter (Signed)
Called and spoke with patient. Let them know their Bronch is scheduled for 11/24/2019 with Dr. Ander Slade at [facility].  Patient was instructed to arrive at hospital at 7:00am. They were instructed to bring someone with them as they will not be able to drive home from procedure. Patient instructed not to have anything to eat or drink after midnight.   Patient's covid screening is scheduled at [place] location for 11/20/2019 at 1:45pm.  Patient voiced understanding, nothing further needed  Routing to Dr. Ander Slade as Juluis Rainier

## 2019-11-20 NOTE — Telephone Encounter (Signed)
[place]

## 2019-11-23 NOTE — Progress Notes (Signed)
Unable to contact patient by telephone.  Sent text message stating arrival time and included endo phone number for question.

## 2019-11-24 ENCOUNTER — Encounter (HOSPITAL_COMMUNITY): Payer: Self-pay | Admitting: Pulmonary Disease

## 2019-11-24 ENCOUNTER — Encounter (HOSPITAL_COMMUNITY)
Admission: RE | Disposition: A | Discharge status: Home / Self Care | Payer: Self-pay | Source: Home / Self Care | Attending: Pulmonary Disease

## 2019-11-24 ENCOUNTER — Ambulatory Visit (HOSPITAL_COMMUNITY)
Admission: RE | Admit: 2019-11-24 | Discharge: 2019-11-24 | Disposition: A | Discharge status: Home / Self Care | Payer: Medicare HMO | Attending: Pulmonary Disease | Admitting: Pulmonary Disease

## 2019-11-24 DIAGNOSIS — Z8551 Personal history of malignant neoplasm of bladder: Secondary | ICD-10-CM | POA: Diagnosis not present

## 2019-11-24 DIAGNOSIS — J479 Bronchiectasis, uncomplicated: Secondary | ICD-10-CM | POA: Insufficient documentation

## 2019-11-24 DIAGNOSIS — Z8616 Personal history of COVID-19: Secondary | ICD-10-CM | POA: Insufficient documentation

## 2019-11-24 DIAGNOSIS — Z9889 Other specified postprocedural states: Secondary | ICD-10-CM

## 2019-11-24 DIAGNOSIS — R911 Solitary pulmonary nodule: Secondary | ICD-10-CM | POA: Diagnosis not present

## 2019-11-24 DIAGNOSIS — Z87891 Personal history of nicotine dependence: Secondary | ICD-10-CM | POA: Diagnosis not present

## 2019-11-24 DIAGNOSIS — R918 Other nonspecific abnormal finding of lung field: Secondary | ICD-10-CM | POA: Insufficient documentation

## 2019-11-24 HISTORY — PX: VIDEO BRONCHOSCOPY: SHX5072

## 2019-11-24 HISTORY — DX: Other specified postprocedural states: Z98.890

## 2019-11-24 SURGERY — VIDEO BRONCHOSCOPY WITHOUT FLUORO
Anesthesia: Moderate Sedation

## 2019-11-24 MED ORDER — LIDOCAINE HCL (PF) 1 % IJ SOLN
INTRAMUSCULAR | Status: DC | PRN
  Administered 2019-11-24: 8 mL

## 2019-11-24 MED ORDER — MIDAZOLAM HCL (PF) 10 MG/2ML IJ SOLN
INTRAMUSCULAR | Status: DC | PRN
  Administered 2019-11-24: 2 mg via INTRAVENOUS
  Administered 2019-11-24: 1 mg via INTRAVENOUS
  Administered 2019-11-24: 2 mg via INTRAVENOUS

## 2019-11-24 MED ORDER — PHENYLEPHRINE HCL 1 % NA SOLN
1.0000 [drp] | Freq: Once | NASAL | Status: AC
  Administered 2019-11-24: 1 [drp] via NASAL

## 2019-11-24 MED ORDER — DIPHENHYDRAMINE HCL 50 MG/ML IJ SOLN
INTRAMUSCULAR | Status: AC
  Filled 2019-11-24: qty 1

## 2019-11-24 MED ORDER — MIDAZOLAM HCL (PF) 5 MG/ML IJ SOLN
INTRAMUSCULAR | Status: AC
  Filled 2019-11-24: qty 2

## 2019-11-24 MED ORDER — LIDOCAINE HCL 1 % IJ SOLN
6.0000 mL | Freq: Once | INTRAMUSCULAR | Status: AC
  Administered 2019-11-24: 6 mL

## 2019-11-24 MED ORDER — FENTANYL CITRATE (PF) 100 MCG/2ML IJ SOLN
INTRAMUSCULAR | Status: AC
  Filled 2019-11-24: qty 4

## 2019-11-24 MED ORDER — PHENYLEPHRINE HCL 0.25 % NA SOLN: 1.0000 | Freq: Four times a day (QID) | NASAL | Status: DC | PRN

## 2019-11-24 MED ORDER — LIDOCAINE HCL URETHRAL/MUCOSAL 2 % EX GEL
CUTANEOUS | Status: AC
  Filled 2019-11-24: qty 30

## 2019-11-24 MED ORDER — LIDOCAINE HCL 4 % EX SOLN: Freq: Once | CUTANEOUS | Status: DC

## 2019-11-24 MED ORDER — FENTANYL CITRATE (PF) 100 MCG/2ML IJ SOLN
INTRAMUSCULAR | Status: DC | PRN
Start: 2019-11-24 — End: 2019-11-24
  Administered 2019-11-24: 25 ug via INTRAVENOUS
  Administered 2019-11-24: 50 ug via INTRAVENOUS
  Administered 2019-11-24: 25 ug via INTRAVENOUS

## 2019-11-24 MED ORDER — LIDOCAINE HCL URETHRAL/MUCOSAL 2 % EX GEL
1.0000 "application " | Freq: Once | CUTANEOUS | Status: AC
  Administered 2019-11-24: 1 via TOPICAL

## 2019-11-24 NOTE — Discharge Instructions (Signed)
Flexible Bronchoscopy  Flexible bronchoscopy is a procedure that is used to examine the passageways in the lungs. During the procedure, a thin, flexible tool with a camera on it (bronchoscope) is passed into the mouth or nose, down through the windpipe (trachea), and into the air tubes (bronchi) in the lungs. This tool allows your health care provider to look at your lungs from the inside and take testing (diagnostic) samples if needed. Tell a health care provider about:  Any allergies you have.  All medicines you are taking, including vitamins, herbs, eye drops, creams, and over-the-counter medicines.  Any problems you or family members have had with anesthetic medicines.  Any blood disorders you have.  Any surgeries you have had.  Any medical conditions you have.  Whether you are pregnant or may be pregnant. What are the risks? Generally, this is a safe procedure. However, problems may occur, including:  Infection.  Bleeding.  Damage to other structures or organs.  Allergic reactions to medicines.  Collapsed lung (pneumothorax).  Increased need for oxygen or difficulty breathing after the procedure. What happens before the procedure? Medicines Ask your health care provider about:  Changing or stopping your regular medicines. This is especially important if you are taking diabetes medicines or blood thinners.  Taking medicines such as aspirin and ibuprofen. These medicines can thin your blood. Do not take these medicines before your procedure if your health care provider instructs you not to. You may be given antibiotic medicine to help prevent infection. Staying hydrated Follow instructions from your health care provider about hydration, which may include:  Up to 2 hours before the procedure - you may continue to drink clear liquids, such as water, clear fruit juice, black coffee, and plain tea. Eating and drinking Follow instructions from your health care provider  about eating and drinking, which may include:  8 hours before the procedure - stop eating heavy meals or foods such as meat, fried foods, or fatty foods.  6 hours before the procedure - stop eating light meals or foods, such as toast or cereal.  6 hours before the procedure - stop drinking milk or drinks that contain milk.  2 hours before the procedure - stop drinking clear liquids. General instructions  Plan to have someone take you home from the hospital or clinic.  If you will be going home right after the procedure, plan to have someone with you for 24 hours. What happens during the procedure?  To lower your risk of infection: ? Your health care team will wash or sanitize their hands. ? Your skin will be washed with soap.  An IV tube will be inserted into one of your veins.  You will be given a medicine (local anesthetic) to numb your mouth, nose, throat, and voice box (larynx). You may also be given one or more of the following: ? A medicine to help you relax (sedative). ? A medicine to control coughing. ? A medicine to dry up any fluids in your lungs (secretions).  A bronchoscope will be passed into your nose or mouth, and into your lungs. Your health care provider will examine your lungs.  Samples of airway secretions may be collected for testing.  If abnormal areas are seen in your airways, tissue samples may be removed for examination under a microscope (biopsy).  If tissue samples are needed from the outer parts of the lung, a type of X-ray (fluoroscopy) may be used to guide the bronchoscope to these areas.  If bleeding  occurs, you may be given medicine to stop or decrease the bleeding. The procedure may vary among health care providers and hospitals. What happens after the procedure?  Do not drive for 24 hours if you were given a sedative.  Your blood pressure, heart rate, breathing rate, and blood oxygen level will be monitored until the medicines you were given  have worn off.  You may have a chest X-ray to check for signs of pneumothorax.  You will not be allowed to eat or drink anything for 2 hours after your procedure.  If a biopsy was taken, it is up to you to get the results of your procedure. Ask your health care provider, or the department that is doing the procedure, when your results will be ready. Summary  Flexible bronchoscopy is a procedure that allows your health care provider to look closely at your lungs from the inside and take testing (diagnostic) samples if needed.  Risks of flexible bronchoscopy include bleeding, infection, and pneumothorax.  Before a flexible bronchoscopy, you will be given a medicine (local anesthetic) to numb your mouth, nose, throat, and voice box (larynx). Then, a bronchoscope will be passed into your nose or mouth, and into your lungs.  After the procedure, your blood pressure, heart rate, breathing rate, and blood oxygen level will be monitored until the medicines you were given have worn off. You may have a chest X-ray to check for signs of pneumothorax.  You will not be allowed to eat or drink anything for 2 hours after your procedure. This information is not intended to replace advice given to you by your health care provider. Make sure you discuss any questions you have with your health care provider. Document Revised: 02/22/2017 Document Reviewed: 04/14/2016 Elsevier Patient Education  2020 [place] back to normal activity gradually  No changes made to your medications  No other intervention needed as there was no abnormality found in your airway

## 2019-11-24 NOTE — Op Note (Signed)
[facility] Cardiopulmonary Patient Name: Billy Hernandez Procedure Date: 11/24/2019 MRN: [id number] Attending MD: Billy Coder MD, MD Date of Birth: 09-04-1955 CSN: [id number] Age: 64 Admit Type: Inpatient Ethnicity: Not Hispanic or Latino Procedure:             Bronchoscopy Indications:           Endobronchial nodule Providers:             Pressley Barsky A. Ander Slade MD, MD, Jeanella Cara, RN,                         Elspeth Cho Tech., Technician, Ladona Ridgel,                         Technician Referring MD:           Medicines:             Fentanyl 100 mcg IV, Midazolam 5 mg IV Complications:         No immediate complications Estimated Blood Loss:  Estimated blood loss: none. Procedure:      Pre-Anesthesia Assessment:      - A History and Physical has been performed. Patient meds and allergies       have been reviewed. The risks and benefits of the procedure and the       sedation options and risks were discussed with the patient. All       questions were answered and informed consent was obtained. Patient       identification and proposed procedure were verified prior to the       procedure by the physician in the pre-procedure area. Mental Status       Examination: alert and oriented. Airway Examination: normal       oropharyngeal airway. Respiratory Examination: clear to auscultation. CV       Examination: normal. ASA Grade Assessment: I - A normal healthy patient.       After reviewing the risks and benefits, the patient was deemed in       satisfactory condition to undergo the procedure. The anesthesia plan was       to use moderate sedation / analgesia (conscious sedation). Immediately       prior to administration of medications, the patient was re-assessed for       adequacy to receive sedatives. The heart rate, respiratory rate, oxygen       saturations, blood pressure, adequacy of pulmonary ventilation, and       response to care were  monitored throughout the procedure. The physical       status of the patient was re-assessed after the procedure.      After obtaining informed consent, the bronchoscope was passed under       direct vision. Throughout the procedure, the patient's blood pressure,       pulse, and oxygen saturations were monitored continuously. the BF-H190       ([id number]) Olympus Bronchoscope was introduced through the mouth and       advanced to the tracheobronchial tree of both lungs. The procedure was       accomplished without difficulty. The patient tolerated the procedure       well. The procedure was accomplished without difficulty. The patient       tolerated the procedure well. Findings:      The oropharynx appears normal. The larynx appears normal. The vocal  cords appear normal. The subglottic space is normal. The trachea is of       normal caliber. The carina is sharp. The tracheobronchial tree was       examined to at least the first subsegmental level. Bronchial mucosa and       anatomy are normal; there are no endobronchial lesions, and no       secretions. Impression:      - Endobronchial nodule      - The airway examination was normal.      - No specimens collected. Moderate Sedation:      Moderate (conscious) sedation was administered by the endoscopy nurse       and supervised by the endoscopist. Recommendation:      - Follow up with bronchoscopist as previously scheduled. Procedure Code(s):      --- Professional ---      [phone], Bronchoscopy, rigid or flexible, including fluoroscopic guidance,       when performed; diagnostic, with cell washing, when performed (separate       procedure) Diagnosis Code(s):      --- Professional ---      R91.8, Other nonspecific abnormal finding of lung field CPT copyright 2019 American Medical Association. All rights reserved. The codes documented in this report are preliminary and upon Hernandez review may  be revised to meet current compliance  requirements. [name], MD Billy Coder MD, MD 11/24/2019 9:50:09 AM This report has been signed electronically. Number of Addenda: 0 Scope In: Scope Out:

## 2019-11-24 NOTE — Progress Notes (Signed)
Patient contacted with results of recent lab work. He plans to follow up with his PCP regarding his low sodium and chloride. He verbalized understanding of results.

## 2019-11-24 NOTE — Telephone Encounter (Signed)
FYI- Dr.Beavers, please note from below.Please also see Dr.Olalere office visit note from 11/19/19. Thank you.

## 2019-11-24 NOTE — Op Note (Signed)
Bronchoscopy Procedure Note  NICHOLES HIBLER  [id number]  Sep 29, 1955  Date:11/24/19  Time:9:36 AM   Provider Performing:Emersyn Kotarski A Analiya Porco   Procedure(s):  Flexible Bronchoscopy (53299)  Indication(s) Endobronchial lesion  Consent Risks of the procedure as well as the alternatives and risks of each were explained to the patient and/or caregiver.  Consent for the procedure was obtained and is signed in the bedside chart  Anesthesia Moderate sedation   Time Out Verified patient identification, verified procedure, site/side was marked, verified correct patient position, special equipment/implants available, medications/allergies/relevant history reviewed, required imaging and test results available.   Sterile Technique Usual hand hygiene, masks, gowns, and gloves were used   Procedure Description Bronchoscope advanced through mouth and into airway.  Airways were examined down to subsegmental level with findings noted below.   Following diagnostic evaluation, no brushings or biopsies needed  Findings: Normal airway with no endobronchial lesion   Complications/Tolerance None; patient tolerated the procedure well. Chest X-ray is not needed post procedure.   EBL None   Specimen(s) No specimens

## 2019-11-24 NOTE — Interval H&P Note (Signed)
History and Physical Interval Note:  Patient was recently evaluated in the office and was seen this morning Has no overnight events Questions regarding bronchoscopy was answered  CT scan reviewed  Procedure being performed to rule out endobronchial process, risks and benefits reviewed  Agrees to go ahead with bronchoscopy   11/24/2019 8:29 AM  Billy Hernandez  has presented today for surgery, with the diagnosis of ENDOBRONCHIAL LESION.  The various methods of treatment have been discussed with the patient and family. After consideration of risks, benefits and other options for treatment, the patient has consented to  Procedure(s): VIDEO BRONCHOSCOPY WITHOUT FLUORO (N/A) as a surgical intervention.  The patient's history has been reviewed, patient examined, no change in status, stable for surgery.  I have reviewed the patient's chart and labs.  Questions were answered to the patient's satisfaction.     Azarya Oconnell A Kysean Sweet

## 2019-11-25 ENCOUNTER — Encounter (HOSPITAL_COMMUNITY): Payer: Self-pay | Admitting: Pulmonary Disease

## 2019-11-25 DIAGNOSIS — Z8619 Personal history of other infectious and parasitic diseases: Secondary | ICD-10-CM

## 2019-11-25 HISTORY — DX: Personal history of other infectious and parasitic diseases: Z86.19

## 2019-11-26 NOTE — Telephone Encounter (Signed)
Thank you. It looks like his procedures were scheduled.

## 2019-12-08 ENCOUNTER — Telehealth: Payer: Self-pay | Admitting: Pulmonary Disease

## 2019-12-08 ENCOUNTER — Encounter: Payer: Self-pay | Admitting: Gastroenterology

## 2019-12-08 DIAGNOSIS — J479 Bronchiectasis, uncomplicated: Secondary | ICD-10-CM

## 2019-12-08 NOTE — Telephone Encounter (Signed)
Called patient, he has an albuterol inhaler, not using.  Has a difficult time getting that last breath in, like when talking.  Does not feel the need to use the albuterol for his symptoms.  He wants to know if he could benefit from using a combination inhaler like Advair.  Dr. Ander Slade, please advise.

## 2019-12-10 ENCOUNTER — Other Ambulatory Visit: Payer: Self-pay

## 2019-12-10 ENCOUNTER — Other Ambulatory Visit: Payer: Self-pay | Admitting: Pulmonary Disease

## 2019-12-10 ENCOUNTER — Ambulatory Visit: Payer: Medicare HMO

## 2019-12-10 MED ORDER — FLUTICASONE-SALMETEROL 250-50 MCG/DOSE IN AEPB: 1.0000 | INHALATION_SPRAY | Freq: Two times a day (BID) | RESPIRATORY_TRACT | 3 refills | Status: DC

## 2019-12-10 NOTE — Telephone Encounter (Signed)
Yes, you may try Advair  Prescription for Advair sent into pharmacy  Order a pulmonary function test for him

## 2019-12-10 NOTE — Progress Notes (Unsigned)
Prescription for Advair sent into pharmacy

## 2019-12-10 NOTE — Telephone Encounter (Signed)
Called and spoke with pt letting him know that AO sent Rx for Advair to pharmacy for him. Also stated to him that AO wants him to do a PFT and he verbalized understanding. PFT has been scheduled for pt this afternoon 9/16 at 2pm and he has been told to bring covid vaccine card. Nothing further needed.

## 2019-12-16 ENCOUNTER — Encounter: Payer: Self-pay | Admitting: Gastroenterology

## 2019-12-16 ENCOUNTER — Other Ambulatory Visit: Payer: Self-pay

## 2019-12-16 ENCOUNTER — Ambulatory Visit (AMBULATORY_SURGERY_CENTER): Payer: Medicare HMO | Admitting: Gastroenterology

## 2019-12-16 VITALS — BP 191/90 | HR 64 | Temp 97.3°F | Resp 15 | Ht 73.0 in | Wt 181.0 lb

## 2019-12-16 DIAGNOSIS — R1319 Other dysphagia: Secondary | ICD-10-CM | POA: Diagnosis not present

## 2019-12-16 DIAGNOSIS — K2100 Gastro-esophageal reflux disease with esophagitis, without bleeding: Secondary | ICD-10-CM | POA: Diagnosis not present

## 2019-12-16 DIAGNOSIS — D122 Benign neoplasm of ascending colon: Secondary | ICD-10-CM

## 2019-12-16 DIAGNOSIS — Z1211 Encounter for screening for malignant neoplasm of colon: Secondary | ICD-10-CM | POA: Diagnosis not present

## 2019-12-16 DIAGNOSIS — D129 Benign neoplasm of anus and anal canal: Secondary | ICD-10-CM

## 2019-12-16 DIAGNOSIS — K2970 Gastritis, unspecified, without bleeding: Secondary | ICD-10-CM

## 2019-12-16 DIAGNOSIS — K228 Other specified diseases of esophagus: Secondary | ICD-10-CM | POA: Diagnosis not present

## 2019-12-16 DIAGNOSIS — K621 Rectal polyp: Secondary | ICD-10-CM | POA: Diagnosis not present

## 2019-12-16 DIAGNOSIS — K2950 Unspecified chronic gastritis without bleeding: Secondary | ICD-10-CM | POA: Diagnosis not present

## 2019-12-16 HISTORY — PX: COLONOSCOPY WITH ESOPHAGOGASTRODUODENOSCOPY (EGD): SHX5779

## 2019-12-16 MED ORDER — SODIUM CHLORIDE 0.9 % IV SOLN: 500.0000 mL | Freq: Once | INTRAVENOUS | Status: DC

## 2019-12-16 NOTE — Op Note (Signed)
[facility] Patient Name: Billy Hernandez Procedure Date: 12/16/2019 2:12 PM MRN: [id number] Endoscopist: Thornton Park MD, MD Age: 64 Referring MD:  Date of Birth: 1955/07/06 Gender: Male Account #: [id number] Procedure:                Colonoscopy Indications:              Screening for colorectal malignant neoplasm, This                            is the patient's first colonoscopy Medicines:                Monitored Anesthesia Care Procedure:                Pre-Anesthesia Assessment:                           - Prior to the procedure, a History and Physical                            was performed, and patient medications and                            allergies were reviewed. The patient's tolerance of                            previous anesthesia was also reviewed. The risks                            and benefits of the procedure and the sedation                            options and risks were discussed with the patient.                            All questions were answered, and informed consent                            was obtained. Prior Anticoagulants: The patient has                            taken no previous anticoagulant or antiplatelet                            agents. ASA Grade Assessment: II - A patient with                            mild systemic disease. After reviewing the risks                            and benefits, the patient was deemed in                            satisfactory condition to undergo the procedure.  After obtaining informed consent, the colonoscope                            was passed under direct vision. Throughout the                            procedure, the patient's blood pressure, pulse, and                            oxygen saturations were monitored continuously. The                            Colonoscope was introduced through the anus and                            advanced to the the  cecum, identified by                            appendiceal orifice and ileocecal valve. The                            colonoscopy was performed with moderate difficulty                            due to a redundant colon and significant looping.                            Successful completion of the procedure was aided by                            applying abdominal pressure. The patient tolerated                            the procedure well. The quality of the bowel                            preparation was good. The ileocecal valve,                            appendiceal orifice, and rectum were photographed. Scope In: 2:33:21 PM Scope Out: 2:58:16 PM Scope Withdrawal Time: 0 hours 18 minutes 4 seconds  Total Procedure Duration: 0 hours 24 minutes 55 seconds  Findings:                 The perianal and digital rectal examinations were                            normal.                           Many small and large-mouthed diverticula were found                            in the sigmoid colon and descending colon.  A 2 mm polyp was found in the rectum. The polyp was                            flat. The polyp was removed with a cold snare.                            Resection and retrieval were complete. Estimated                            blood loss was minimal.                           A 14 mm polyp was found in the mid ascending colon.                            The polyp was flat. Most of the polyp was removed                            in one piece. A residual edge of the polyp was                            removed in a separate resection. Resection and                            retrieval were complete. Estimated blood loss was                            minimal.                           A 4 mm polyp was found in the proximal ascending                            colon. The polyp was flat. The polyp was removed                            with a  cold snare. Resection and retrieval were                            complete. Estimated blood loss was minimal.                           Several small rectal hyperplastic polyps were not                            removed. The exam was otherwise without abnormality                            on direct and retroflexion views. Complications:            No immediate complications. Estimated blood loss:  Minimal. Estimated Blood Loss:     Estimated blood loss was minimal. Impression:               - Diverticulosis in the sigmoid colon and in the                            descending colon.                           - One 2 mm polyp in the rectum, removed with a cold                            snare. Resected and retrieved.                           - One 14 mm polyp in the mid ascending colon,                            removed piecemeal using a cold snare. Resected and                            retrieved.                           - One 4 mm polyp in the proximal ascending colon,                            removed with a cold snare. Resected and retrieved.                           - The examination was otherwise normal on direct                            and retroflexion views. Recommendation:           - Patient has a contact number available for                            emergencies. The signs and symptoms of potential                            delayed complications were discussed with the                            patient. Return to normal activities tomorrow.                            Written discharge instructions were provided to the                            patient.                           - High fiber diet.                           -  Continue present medications.                           - Await pathology results.                           - Repeat colonoscopy date to be determined after                            pending pathology results are  reviewed for                            surveillance.                           - Emerging evidence supports eating a diet of                            fruits, vegetables, grains, calcium, and yogurt                            while reducing red meat and alcohol may reduce the                            risk of colon cancer.                           - Thank you for allowing me to be involved in your                            colon cancer prevention. Thornton Park MD, MD 12/16/2019 3:12:11 PM This report has been signed electronically.

## 2019-12-16 NOTE — Progress Notes (Signed)
To PACU, VSS. Report to [email or web address] 

## 2019-12-16 NOTE — Op Note (Signed)
[facility] Patient Name: Billy Hernandez Procedure Date: 12/16/2019 2:13 PM MRN: [id number] Endoscopist: Thornton Park MD, MD Age: 64 Referring MD:  Date of Birth: [birth year] Gender: Male Account #: [id number] Procedure:                Upper GI endoscopy Indications:              Dysphagia to solid foods                           Shortness of breath Medicines:                Monitored Anesthesia Care Procedure:                Pre-Anesthesia Assessment:                           - Prior to the procedure, a History and Physical                            was performed, and patient medications and                            allergies were reviewed. The patient's tolerance of                            previous anesthesia was also reviewed. The risks                            and benefits of the procedure and the sedation                            options and risks were discussed with the patient.                            All questions were answered, and informed consent                            was obtained. Prior Anticoagulants: The patient has                            taken no previous anticoagulant or antiplatelet                            agents. ASA Grade Assessment: II - A patient with                            mild systemic disease. After reviewing the risks                            and benefits, the patient was deemed in                            satisfactory condition to undergo the procedure.  After obtaining informed consent, the endoscope was                            passed under direct vision. Throughout the                            procedure, the patient's blood pressure, pulse, and                            oxygen saturations were monitored continuously. The                            Endoscope was introduced through the mouth, and                            advanced to the third part of duodenum. The upper                             GI endoscopy was accomplished without difficulty.                            The patient tolerated the procedure well. Scope In: Scope Out: Findings:                 LA Grade C (one or more mucosal breaks continuous                            between tops of 2 or more mucosal folds, less than                            75% circumference) esophagitis with no bleeding was                            found in the distal esophagus. Biopsies were taken                            from the mid/proximal and distal esophagus with a                            cold forceps for histology. Estimated blood loss                            was minimal.                           Diffuse mild inflammation characterized by                            erythema, friability and granularity was found in                            the gastric body. Biopsies were taken with a cold  forceps for histology. Estimated blood loss was                            minimal.                           The examined duodenum was normal. Complications:            No immediate complications. Estimated blood loss:                            Minimal. Estimated Blood Loss:     Estimated blood loss was minimal. 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                            Biopsied.                           - Gastritis. Biopsied.                           - Normal examined duodenum. Recommendation:           - Patient has a contact number available for                            emergencies. The signs and symptoms of potential                            delayed complications were discussed with the                            patient. Return to normal activities tomorrow.                            Written discharge instructions were provided to the                            patient.                           - Resume previous diet.                            - Continue present medications.                           - No aspirin, ibuprofen, naproxen, or other                            non-steroidal anti-inflammatory drugs.                           - Await pathology results. Thornton Park MD, MD 12/16/2019 3:06:28 PM This report has been signed electronically.

## 2019-12-16 NOTE — Progress Notes (Signed)
Called to room to assist during endoscopic procedure.  Patient ID and intended procedure confirmed with present staff. Received instructions for my participation in the procedure from the performing physician.  

## 2019-12-16 NOTE — Patient Instructions (Addendum)
Handouts given:  Gastritis, hemorrhoids , diverticulosis, polyps, high fiber diet Start high fiber diet Continue present medications Await pathology results No aspirin ibuprofen, naproxen, motrin  or other NSAIDS   YOU HAD AN ENDOSCOPIC PROCEDURE TODAY AT [facility]:   Refer to the procedure report that was given to you for any specific questions about what was found during the examination.  If the procedure report does not answer your questions, please call your gastroenterologist to clarify.  If you requested that your care partner not be given the details of your procedure findings, then the procedure report has been included in a sealed envelope for you to review at your convenience later.  YOU SHOULD EXPECT: Some feelings of bloating in the abdomen. Passage of more gas than usual.  Walking can help get rid of the air that was put into your GI tract during the procedure and reduce the bloating. If you had a lower endoscopy (such as a colonoscopy or flexible sigmoidoscopy) you may notice spotting of blood in your stool or on the toilet paper. If you underwent a bowel prep for your procedure, you may not have a normal bowel movement for a few days.  Please Note:  You might notice some irritation and congestion in your nose or some drainage.  This is from the oxygen used during your procedure.  There is no need for concern and it should clear up in a day or so.  SYMPTOMS TO REPORT IMMEDIATELY:   Following lower endoscopy (colonoscopy or flexible sigmoidoscopy):  Excessive amounts of blood in the stool  Significant tenderness or worsening of abdominal pains  Swelling of the abdomen that is new, acute  Fever of 100F or higher   Following upper endoscopy (EGD)  Vomiting of blood or coffee ground material  New chest pain or pain under the shoulder blades  Painful or persistently difficult swallowing  New shortness of breath  Fever of 100F or higher  Black,  tarry-looking stools  For urgent or emergent issues, a gastroenterologist can be reached at any hour by calling [phone]. Do not use MyChart messaging for urgent concerns.    DIET:  We do recommend a small meal at first, but then you may proceed to your regular diet.  Drink plenty of fluids but you should avoid alcoholic beverages for 24 hours.  ACTIVITY:  You should plan to take it easy for the rest of today and you should NOT DRIVE or use heavy machinery until tomorrow (because of the sedation medicines used during the test).    FOLLOW UP: Our staff will call the number listed on your records 48-72 hours following your procedure to check on you and address any questions or concerns that you may have regarding the information given to you following your procedure. If we do not reach you, we will leave a message.  We will attempt to reach you two times.  During this call, we will ask if you have developed any symptoms of COVID 19. If you develop any symptoms (ie: fever, flu-like symptoms, shortness of breath, cough etc.) before then, please call [phone].  If you test positive for Covid 19 in the 2 weeks post procedure, please call and report this information to [place].    If any biopsies were taken you will be contacted by phone or by letter within the next 1-3 weeks.  Please call us at [phone] if you have not heard about the biopsies in 3 weeks.  SIGNATURES/CONFIDENTIALITY: You and/or your care partner have signed paperwork which will be entered into your electronic medical record.  These signatures attest to the fact that that the information above on your After Visit Summary has been reviewed and is understood.  Full responsibility of the confidentiality of this discharge information lies with you and/or your care-partner.

## 2019-12-18 ENCOUNTER — Telehealth: Payer: Self-pay

## 2019-12-18 NOTE — Telephone Encounter (Signed)
  Follow up Call-  Call back number 12/16/2019  Post procedure Call Back phone  # [phone]  Permission to leave phone message No     Patient questions:  Do you have a fever, pain , or abdominal swelling? No. Pain Score  0 *  Have you tolerated food without any problems? Yes.    Have you been able to return to your normal activities? Yes.    Do you have any questions about your discharge instructions: Diet   No. Medications  No. Follow up visit  No.  Do you have questions or concerns about your Care? No.  Actions: * If pain score is 4 or above: No action needed, pain <4.   1. Have you developed a fever since your procedure? no  2.   Have you had an respiratory symptoms (SOB or cough) since your procedure? no  3.   Have you tested positive for COVID 19 since your procedure no  4.   Have you had any family members/close contacts diagnosed with the COVID 19 since your procedure?  no   If yes to any of these questions please route to Joylene John, RN and Joella Prince, RN

## 2019-12-25 ENCOUNTER — Other Ambulatory Visit: Payer: Self-pay

## 2019-12-25 MED ORDER — TETRACYCLINE HCL 500 MG PO CAPS: 500.0000 mg | ORAL_CAPSULE | Freq: Four times a day (QID) | ORAL | 0 refills | Status: DC

## 2019-12-25 MED ORDER — BISMUTH 262 MG PO CHEW: 524.0000 mg | CHEWABLE_TABLET | Freq: Four times a day (QID) | ORAL | 0 refills | Status: DC

## 2019-12-25 MED ORDER — PANTOPRAZOLE SODIUM 40 MG PO TBEC: 40.0000 mg | DELAYED_RELEASE_TABLET | Freq: Two times a day (BID) | ORAL | 0 refills | Status: DC

## 2019-12-25 MED ORDER — METRONIDAZOLE 500 MG PO TABS: 500.0000 mg | ORAL_TABLET | Freq: Four times a day (QID) | ORAL | 0 refills | Status: DC

## 2020-01-28 ENCOUNTER — Ambulatory Visit (INDEPENDENT_AMBULATORY_CARE_PROVIDER_SITE_OTHER): Payer: Medicare HMO | Admitting: Pulmonary Disease

## 2020-01-28 ENCOUNTER — Other Ambulatory Visit: Payer: Self-pay

## 2020-01-28 DIAGNOSIS — J479 Bronchiectasis, uncomplicated: Secondary | ICD-10-CM | POA: Diagnosis not present

## 2020-01-28 LAB — PULMONARY FUNCTION TEST
DL/VA % pred: 97 %
DL/VA: 3.98 ml/min/mmHg/L
DLCO cor % pred: 110 %
DLCO cor: 30.9 ml/min/mmHg
DLCO unc % pred: 110 %
DLCO unc: 30.9 ml/min/mmHg
FEF 25-75 Post: 3.73 L/sec
FEF 25-75 Pre: 2.33 L/sec
FEF2575-%Change-Post: 59 %
FEF2575-%Pred-Post: 132 %
FEF2575-%Pred-Pre: 82 %
FEV1-%Change-Post: 13 %
FEV1-%Pred-Post: 116 %
FEV1-%Pred-Pre: 103 %
FEV1-Post: 4.2 L
FEV1-Pre: 3.71 L
FEV1FVC-%Change-Post: 6 %
FEV1FVC-%Pred-Pre: 92 %
FEV6-%Change-Post: 6 %
FEV6-%Pred-Post: 122 %
FEV6-%Pred-Pre: 116 %
FEV6-Post: 5.63 L
FEV6-Pre: 5.32 L
FEV6FVC-%Change-Post: 0 %
FEV6FVC-%Pred-Post: 103 %
FEV6FVC-%Pred-Pre: 103 %
FVC-%Change-Post: 5 %
FVC-%Pred-Post: 118 %
FVC-%Pred-Pre: 112 %
FVC-Post: 5.73 L
FVC-Pre: 5.41 L
Post FEV1/FVC ratio: 73 %
Post FEV6/FVC ratio: 98 %
Pre FEV1/FVC ratio: 69 %
Pre FEV6/FVC Ratio: 99 %
RV % pred: 122 %
RV: 3.01 L
TLC % pred: 115 %
TLC: 8.45 L

## 2020-01-28 NOTE — Progress Notes (Signed)
PFT done today. 

## 2020-02-15 ENCOUNTER — Telehealth: Payer: Self-pay

## 2020-02-15 NOTE — Telephone Encounter (Signed)
Pt scheduled for f/u with Dr. Tarri Glenn 02/26/20. Per path results from 12/16/19:  Please call the patient. His pathology results from his upper endoscopy show reflux and H. pylori gastritis. Given these results I recommend 14 days of treatment with bismuth subsalicylate 433 mg 4 times daily daily, metronidazole 500 mg 4 times daily, tetracycline 500 mg 4 times daily, and pantoprazole 40 mg twice daily. I recommend that the patient abstain from all alcohol while taking metronidazole. Eight weeks after completing the treatment, an H pylori stool antigen should be performed to monitor for response to treatment. Return to the office after completing treatment.  The polyps removed during the procedure include two precancerous polyps and a benign polyp. The largest polyp was a precancerous polyp with some advanced features. Because it was removed in pieces, he should have another colonoscopy in 6 months to be sure that the polyp was completely removed. If completely removed at that time, will be able to extend his surveillance interval to 3 years.   We will be able to review all of these results during his upcoming office visit. Please schedule. Thanks.   Uncertain about the date repeat stool needed. Called pt to follow up re: date he initiated treatment. States he never started treatment. States he was not even aware he needed to start treatment. Inquired if MyChart is his preferred means of communication. Pt never responded to my question but states he may have seen the message but just unable to recall. Further added, "I felt great afterwards", then asked "should I start the treatment now?" Advised I will forward this message to Dr. Tarri Glenn for further recommendations/instructions. Advised I will call after I receive her response. Verbalized acceptance and understanding.

## 2020-02-15 NOTE — Telephone Encounter (Signed)
Called pt and informed about Dr. Tarri Glenn response. Advised he call his pharmacy to ensure they still have the Rx in their data base for them to fill. If not, advised he call so new orders can be placed. Also advised he call once he has begun treatment so his repeat stool can be ordered within the appropriate time frame post treatment AND his f/u appt can be scheduled once we have received his stool results. Verbalized acceptance and understanding of all information provided.

## 2020-02-15 NOTE — Telephone Encounter (Signed)
He needs to have his H pylori treated as outlined below. Would be best to move his office visit until after he completes treatment. Thank you.

## 2020-02-26 ENCOUNTER — Ambulatory Visit: Payer: Medicare HMO | Admitting: Gastroenterology

## 2020-03-01 NOTE — Telephone Encounter (Signed)
Attempted to call pt to f/u as per our discussion. No answer.

## 2020-03-03 NOTE — Telephone Encounter (Signed)
SECOND ATTEMPT:  Called pt to inquire about Rx's and if he had started tx't per Dr. Tarri Glenn instructions below. Pt states he does intend to complete treatment until after the holidays. Further states he "feels good" and will call when he does decide to proceed with treatment regimen.

## 2020-04-17 IMAGING — CT CT ANGIO CHEST
2 of 7 series · 18 of 46 positions shown · IV contrast (ISOVUE 370)
Comparison: Chest radiograph November 14, 2017

CLINICAL DATA: Shortness of Breath

EXAM:
CT ANGIOGRAPHY CHEST WITH CONTRAST
TECHNIQUE: Multidetector CT imaging of the chest was performed using the
standard protocol during bolus administration of intravenous
contrast. Multiplanar CT image reconstructions and MIPs were
obtained to evaluate the vascular anatomy.
CONTRAST:  80 mL OKV5HA-6UL IOPAMIDOL (OKV5HA-6UL) INJECTION 76%

[Series 5: thins · axial · 0.69mm/px · z∈[-380,-62]mm · 15 of 350 slices shown]
[im 16/350  lung]
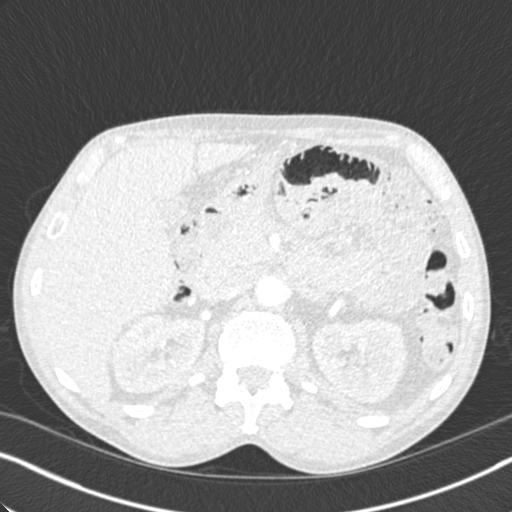
[im 46/350  soft-tissue]
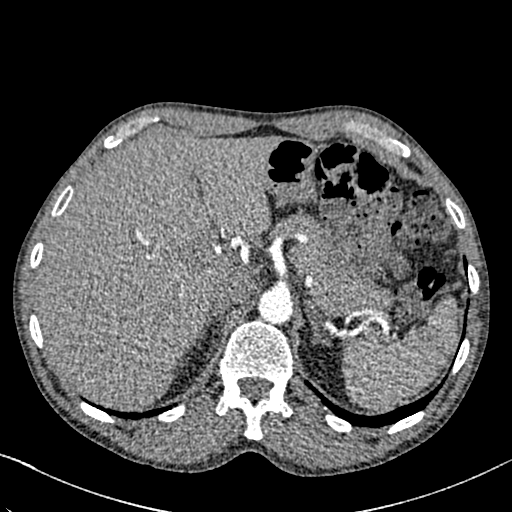
[im 61/350  lung]
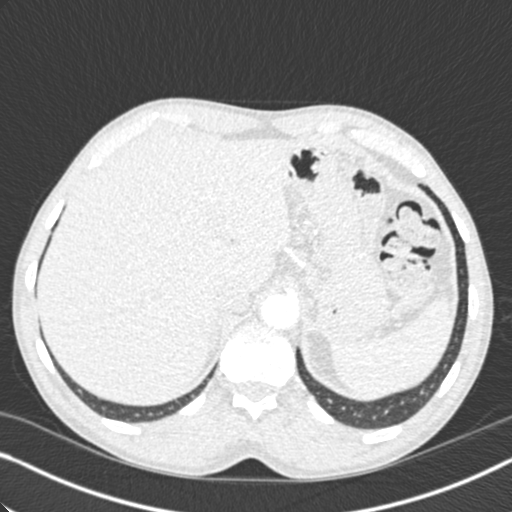
[im 92/350  soft-tissue]
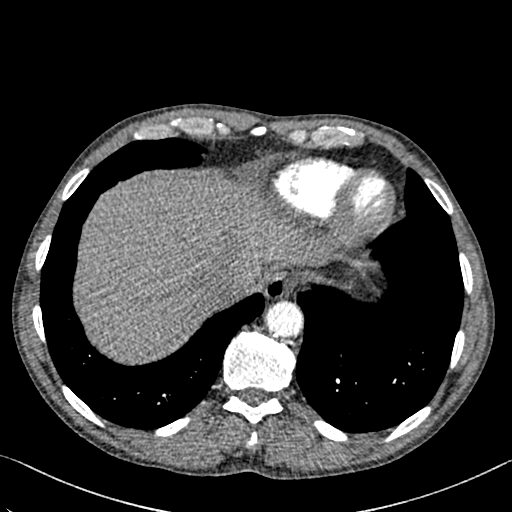
[im 107/350  lung]
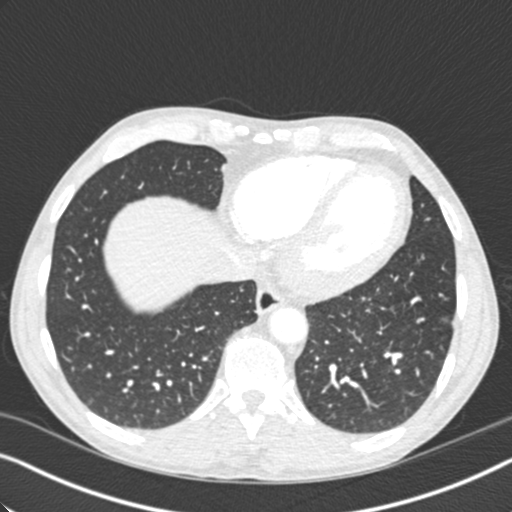
[im 137/350  soft-tissue]
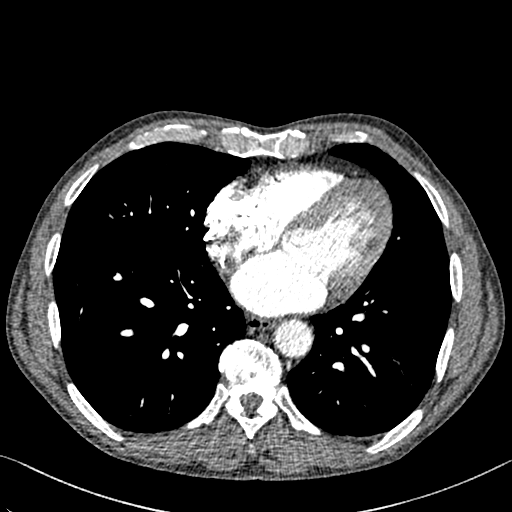
[im 152/350  lung]
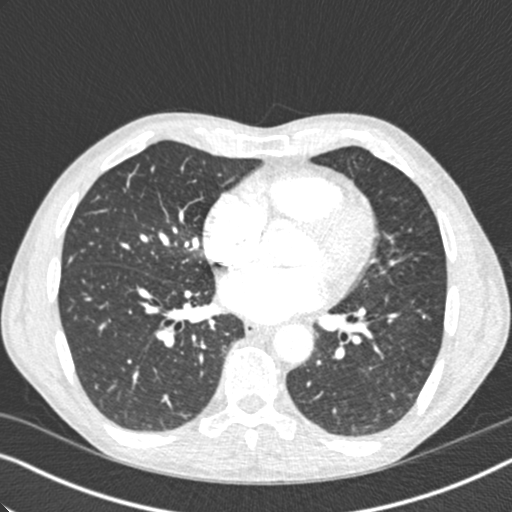
[im 183/350  soft-tissue]
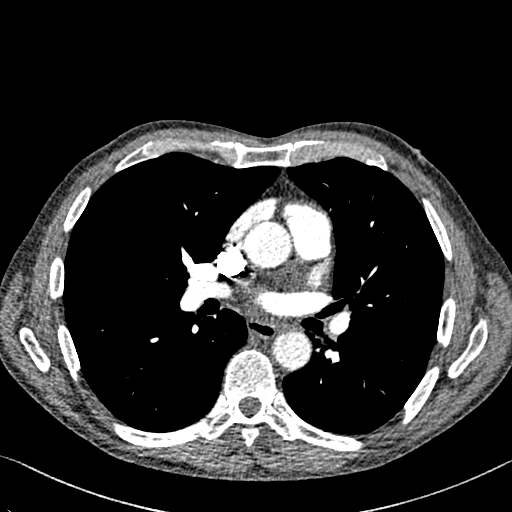
[im 198/350  lung]
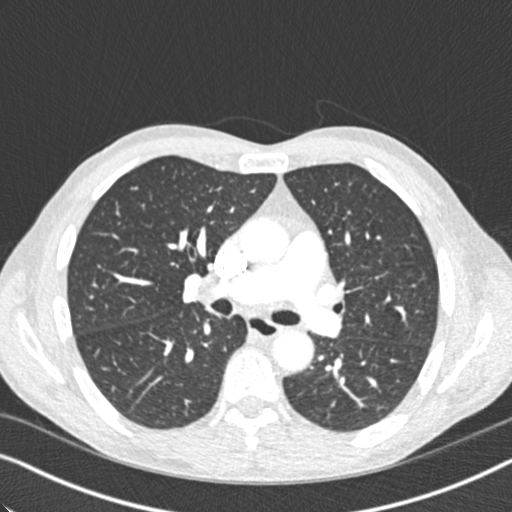
[im 213/350  soft-tissue]
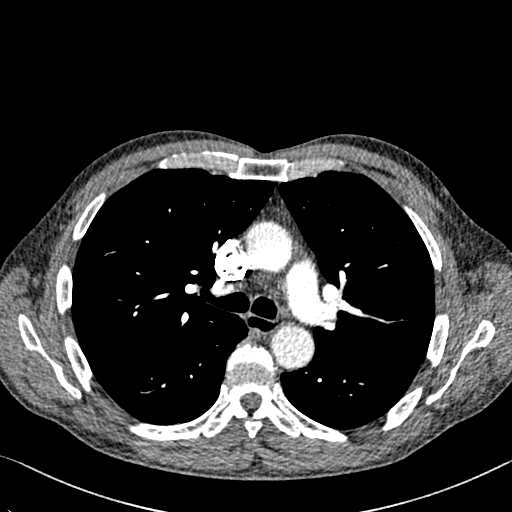
[im 243/350  lung]
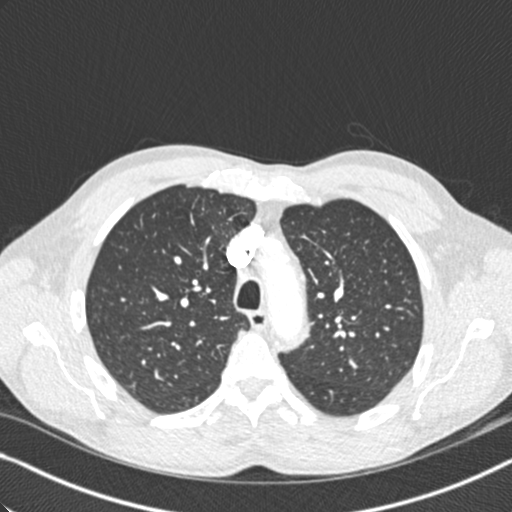
[im 258/350  soft-tissue]
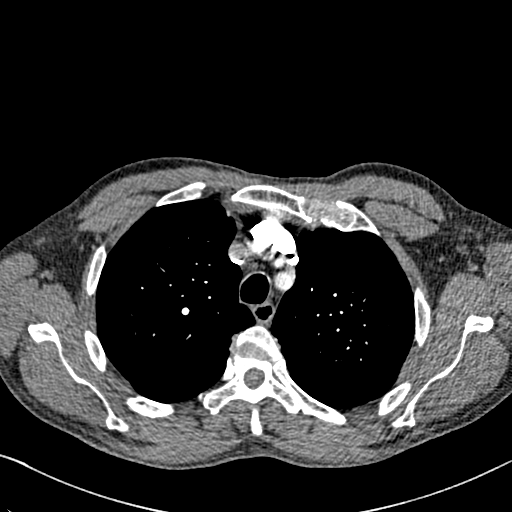
[im 289/350  lung]
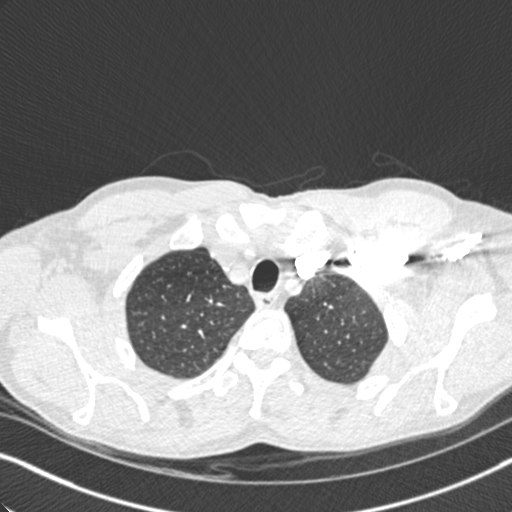
[im 304/350  soft-tissue]
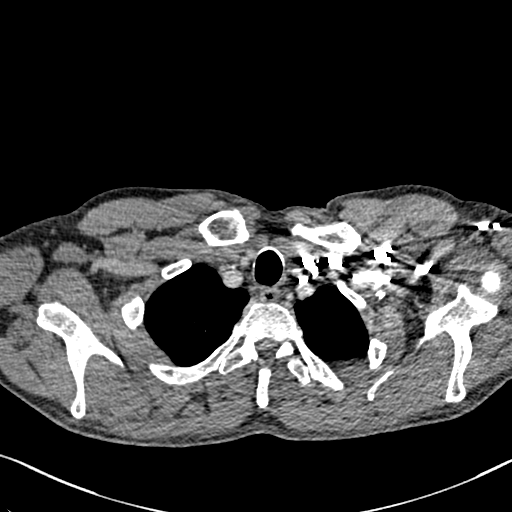
[im 334/350  lung]
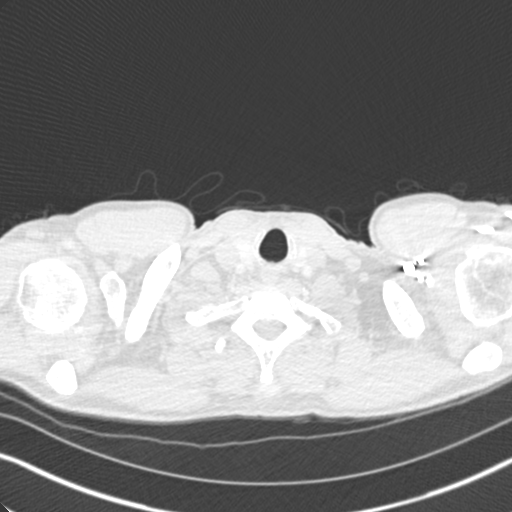

[Series 7: coronal mpr · coronal · 0.65mm/px · 3 of 129 slices shown]
[im 33/129  soft-tissue]
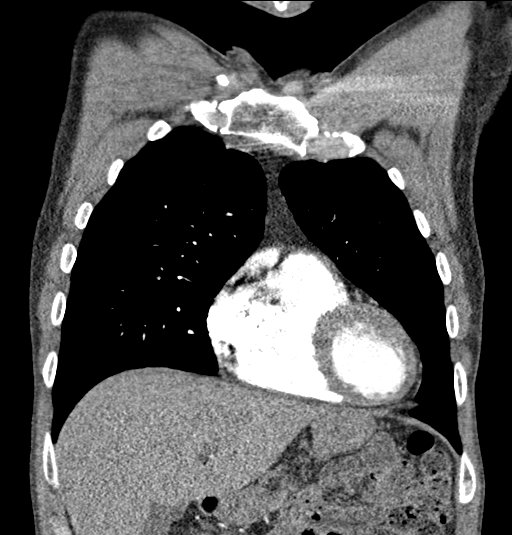
[im 65/129  soft-tissue]
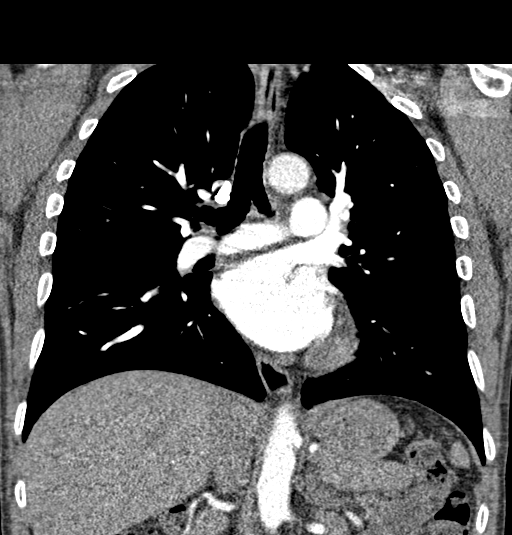
[im 97/129  soft-tissue]
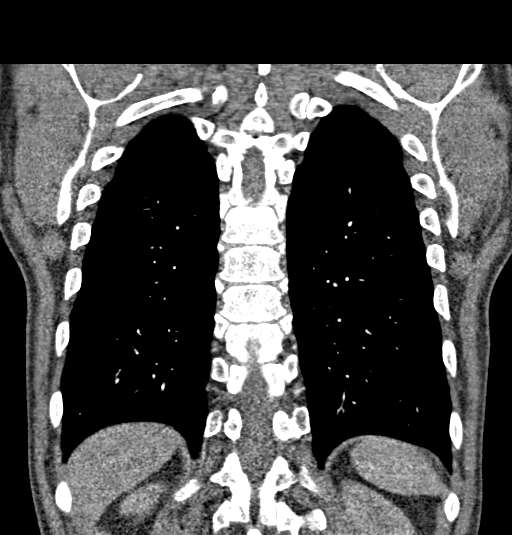

[18 of 46 positions shown; findings below may reference images not displayed]

FINDINGS: Cardiovascular: There is no demonstrable pulmonary embolus. There is
no thoracic aortic aneurysm or dissection. Visualized great vessels
appear normal except for minimal atherosclerotic calcification at
the origins of the great vessels. There is no pericardial effusion
or pericardial thickening evident. There is slight calcification in
the mitral annulus.

Mediastinum/Nodes: Thyroid appears normal. There is no appreciable
thoracic adenopathy. No esophageal lesions are appreciable.

Lungs/Pleura: There is no appreciable edema or consolidation. There
is an area of apparent scarring in the lingula. There is also
apparent scarring in the lateral segment of the left lower lobe. On
axial slice 56 series 6, there is a 2 mm nodular opacity in the
anterior segment of the right upper lobe. There are a few calcified
granulomas scattered throughout the left lung. No frank airspace
consolidation evident. No pleural effusion or pleural thickening
evident.

Upper Abdomen: Visualized upper abdominal structures appear
unremarkable.

Musculoskeletal: No blastic or lytic bone lesions are evident. There
is degenerative change in the thoracic spine. No chest wall lesions
are appreciable.

Review of the MIP images confirms the above findings.
IMPRESSION: 1. No demonstrable pulmonary embolus. No thoracic aortic aneurysm or
dissection.

2. Areas of scattered lung scarring on the left. No edema or
consolidation. 2 mm nodular opacity in the right upper lobe. No
follow-up needed if patient is low-risk. Non-contrast chest CT can
be considered in 12 months if patient is high-risk. This
recommendation follows the consensus statement: Guidelines for
Management of Incidental Pulmonary Nodules Detected on CT Images:
Occasional calcified granulomas.

3.  No demonstrable thoracic adenopathy.

## 2020-07-05 ENCOUNTER — Telehealth: Payer: Self-pay | Admitting: Pulmonary Disease

## 2020-07-07 NOTE — Telephone Encounter (Signed)
Patient scheduled with Dr. Ander Slade on 08/16/2020-pr

## 2020-07-18 ENCOUNTER — Encounter: Payer: Self-pay | Admitting: Gastroenterology

## 2020-08-16 ENCOUNTER — Ambulatory Visit: Payer: Medicare HMO | Admitting: Pulmonary Disease

## 2020-09-13 ENCOUNTER — Other Ambulatory Visit: Payer: Self-pay

## 2020-09-13 HISTORY — PX: SEPTOPLASTY WITH ETHMOIDECTOMY, AND MAXILLARY ANTROSTOMY: SHX6090

## 2020-10-06 ENCOUNTER — Other Ambulatory Visit: Payer: Self-pay | Admitting: Urology

## 2020-10-25 ENCOUNTER — Other Ambulatory Visit: Payer: Self-pay

## 2020-10-25 ENCOUNTER — Encounter (HOSPITAL_BASED_OUTPATIENT_CLINIC_OR_DEPARTMENT_OTHER): Payer: Self-pay | Admitting: Urology

## 2020-10-25 NOTE — Progress Notes (Signed)
Spoke w/ via phone for pre-op interview--- Pt Lab needs dos----  no             Lab results------ no COVID test -----patient states asymptomatic no test needed Arrive at ------- 0715 on 10-31-2020 NPO after MN NO Solid Food.  Clear liquids from MN until--- 0615 Med rec completed Medications to take morning of surgery ----- NONE Diabetic medication ----- n/a Patient instructed no nail polish to be worn day of surgery Patient instructed to bring photo id and insurance card day of surgery Patient aware to have Driver (ride ) / caregiver for 24 hours after surgery --step-daughter, Roxanne Pfenning Patient Special Instructions ----- n/a Pre-Op special Istructions ----- n/a Patient verbalized understanding of instructions that were given at this phone interview. Patient denies shortness of breath, chest pain, fever, cough at this phone interview.

## 2020-10-28 NOTE — Anesthesia Preprocedure Evaluation (Addendum)
Anesthesia Evaluation  Patient identified by MRN, date of birth, ID band Patient awake    Reviewed: Allergy & Precautions, NPO status , Patient's Chart, lab work & pertinent test results  History of Anesthesia Complications Negative for: history of anesthetic complications  Airway Mallampati: II  TM Distance: >3 FB     Dental no notable dental hx. (+) Dental Advisory Given   Pulmonary neg pulmonary ROS, former smoker,    Pulmonary exam normal        Cardiovascular negative cardio ROS Normal cardiovascular exam     Neuro/Psych negative neurological ROS  negative psych ROS   GI/Hepatic Neg liver ROS,   Endo/Other  negative endocrine ROS  Renal/GU negative Renal ROS  negative genitourinary   Musculoskeletal negative musculoskeletal ROS (+)   Abdominal   Peds negative pediatric ROS (+)  Hematology negative hematology ROS (+)   Anesthesia Other Findings   Reproductive/Obstetrics negative OB ROS                            Anesthesia Physical  Anesthesia Plan  ASA: 2  Anesthesia Plan: General   Post-op Pain Management:    Induction: Intravenous  PONV Risk Score and Plan: 2 and Dexamethasone, Ondansetron and Treatment may vary due to age or medical condition  Airway Management Planned: LMA  Additional Equipment:   Intra-op Plan:   Post-operative Plan: Extubation in OR  Informed Consent: I have reviewed the patients History and Physical, chart, labs and discussed the procedure including the risks, benefits and alternatives for the proposed anesthesia with the patient or authorized representative who has indicated his/her understanding and acceptance.     Dental advisory given  Plan Discussed with: Anesthesiologist  Anesthesia Plan Comments:        Anesthesia Quick Evaluation

## 2020-10-31 ENCOUNTER — Encounter (HOSPITAL_BASED_OUTPATIENT_CLINIC_OR_DEPARTMENT_OTHER): Payer: Self-pay | Admitting: Urology

## 2020-10-31 ENCOUNTER — Ambulatory Visit (HOSPITAL_BASED_OUTPATIENT_CLINIC_OR_DEPARTMENT_OTHER): Payer: Medicare HMO | Admitting: Anesthesiology

## 2020-10-31 ENCOUNTER — Other Ambulatory Visit: Payer: Self-pay

## 2020-10-31 ENCOUNTER — Encounter (HOSPITAL_BASED_OUTPATIENT_CLINIC_OR_DEPARTMENT_OTHER)
Admission: RE | Disposition: A | Discharge status: Home / Self Care | Payer: Self-pay | Source: Home / Self Care | Attending: Urology

## 2020-10-31 ENCOUNTER — Ambulatory Visit (HOSPITAL_BASED_OUTPATIENT_CLINIC_OR_DEPARTMENT_OTHER)
Admission: RE | Admit: 2020-10-31 | Discharge: 2020-10-31 | Disposition: A | Discharge status: Home / Self Care | Payer: Medicare HMO | Attending: Urology | Admitting: Urology

## 2020-10-31 DIAGNOSIS — C675 Malignant neoplasm of bladder neck: Secondary | ICD-10-CM | POA: Diagnosis not present

## 2020-10-31 DIAGNOSIS — Z87891 Personal history of nicotine dependence: Secondary | ICD-10-CM | POA: Diagnosis not present

## 2020-10-31 DIAGNOSIS — Z8551 Personal history of malignant neoplasm of bladder: Secondary | ICD-10-CM | POA: Diagnosis present

## 2020-10-31 DIAGNOSIS — Z8616 Personal history of COVID-19: Secondary | ICD-10-CM | POA: Diagnosis not present

## 2020-10-31 HISTORY — DX: Other seasonal allergic rhinitis: J30.2

## 2020-10-31 HISTORY — DX: Allergic rhinitis, unspecified: J30.9

## 2020-10-31 HISTORY — DX: Chronic sinusitis, unspecified: J32.9

## 2020-10-31 HISTORY — DX: Malignant neoplasm of bladder, unspecified: C67.9

## 2020-10-31 HISTORY — PX: CYSTOSCOPY WITH BIOPSY: SHX5122

## 2020-10-31 SURGERY — CYSTOSCOPY, WITH BIOPSY
Anesthesia: General | Site: Bladder

## 2020-10-31 MED ORDER — CEFAZOLIN SODIUM-DEXTROSE 2-4 GM/100ML-% IV SOLN
2.0000 g | INTRAVENOUS | Status: AC
  Administered 2020-10-31: 2 g via INTRAVENOUS

## 2020-10-31 MED ORDER — PHENAZOPYRIDINE HCL 200 MG PO TABS: 200.0000 mg | ORAL_TABLET | Freq: Three times a day (TID) | ORAL | 0 refills | Status: AC | PRN

## 2020-10-31 MED ORDER — MIDAZOLAM HCL 2 MG/2ML IJ SOLN
INTRAMUSCULAR | Status: AC
  Filled 2020-10-31: qty 2

## 2020-10-31 MED ORDER — FENTANYL CITRATE (PF) 100 MCG/2ML IJ SOLN
INTRAMUSCULAR | Status: AC
  Filled 2020-10-31: qty 2

## 2020-10-31 MED ORDER — MIDAZOLAM HCL 2 MG/2ML IJ SOLN
INTRAMUSCULAR | Status: DC | PRN
  Administered 2020-10-31: 1 mg via INTRAVENOUS

## 2020-10-31 MED ORDER — ONDANSETRON HCL 4 MG/2ML IJ SOLN
INTRAMUSCULAR | Status: AC
  Filled 2020-10-31: qty 8

## 2020-10-31 MED ORDER — LIDOCAINE HCL (CARDIAC) PF 100 MG/5ML IV SOSY
PREFILLED_SYRINGE | INTRAVENOUS | Status: DC | PRN
Start: 2020-10-31 — End: 2020-10-31
  Administered 2020-10-31: 100 mg via INTRAVENOUS

## 2020-10-31 MED ORDER — FENTANYL CITRATE (PF) 100 MCG/2ML IJ SOLN
INTRAMUSCULAR | Status: DC | PRN
  Administered 2020-10-31 (×2): 50 ug via INTRAVENOUS

## 2020-10-31 MED ORDER — SODIUM CHLORIDE 0.9 % IR SOLN
Status: DC | PRN
  Administered 2020-10-31: 3000 mL via INTRAVESICAL

## 2020-10-31 MED ORDER — EPHEDRINE SULFATE 50 MG/ML IJ SOLN
INTRAMUSCULAR | Status: DC | PRN
  Administered 2020-10-31: 10 mg via INTRAVENOUS

## 2020-10-31 MED ORDER — STERILE WATER FOR IRRIGATION IR SOLN
Status: DC | PRN
  Administered 2020-10-31: 500 mL

## 2020-10-31 MED ORDER — LACTATED RINGERS IV SOLN: INTRAVENOUS | Status: DC

## 2020-10-31 MED ORDER — DEXAMETHASONE SODIUM PHOSPHATE 4 MG/ML IJ SOLN
INTRAMUSCULAR | Status: DC | PRN
  Administered 2020-10-31: 8 mg via INTRAVENOUS

## 2020-10-31 MED ORDER — CEFAZOLIN SODIUM-DEXTROSE 2-4 GM/100ML-% IV SOLN
INTRAVENOUS | Status: AC
  Filled 2020-10-31: qty 100

## 2020-10-31 MED ORDER — CEPHALEXIN 500 MG PO CAPS: 500.0000 mg | ORAL_CAPSULE | Freq: Four times a day (QID) | ORAL | 0 refills | Status: DC

## 2020-10-31 MED ORDER — DEXAMETHASONE SODIUM PHOSPHATE 10 MG/ML IJ SOLN
INTRAMUSCULAR | Status: AC
  Filled 2020-10-31: qty 2

## 2020-10-31 MED ORDER — PROPOFOL 10 MG/ML IV BOLUS
INTRAVENOUS | Status: DC | PRN
  Administered 2020-10-31: 200 mg via INTRAVENOUS

## 2020-10-31 SURGICAL SUPPLY — 20 items
BAG DRAIN URO-CYSTO SKYTR STRL (DRAIN) ×2 IMPLANT
BAG DRN UROCATH (DRAIN) ×1
CLOTH BEACON ORANGE TIMEOUT ST (SAFETY) ×2 IMPLANT
ELECT REM PT RETURN 9FT ADLT (ELECTROSURGICAL)
ELECTRODE REM PT RTRN 9FT ADLT (ELECTROSURGICAL) IMPLANT
GLOVE SURG ENC MOIS LTX SZ8 (GLOVE) ×2 IMPLANT
GOWN STRL REUS W/TWL LRG LVL3 (GOWN DISPOSABLE) ×4 IMPLANT
IV NS IRRIG 3000ML ARTHROMATIC (IV SOLUTION) ×2 IMPLANT
KIT TURNOVER CYSTO (KITS) ×2 IMPLANT
LOOP CUT BIPOLAR 24F LRG (ELECTROSURGICAL) ×2 IMPLANT
MANIFOLD NEPTUNE II (INSTRUMENTS) ×2 IMPLANT
NDL SAFETY ECLIPSE 18X1.5 (NEEDLE) IMPLANT
NEEDLE HYPO 18GX1.5 SHARP (NEEDLE)
NEEDLE HYPO 22GX1.5 SAFETY (NEEDLE) IMPLANT
NS IRRIG 500ML POUR BTL (IV SOLUTION) IMPLANT
PACK CYSTO (CUSTOM PROCEDURE TRAY) ×2 IMPLANT
SYR 20ML LL LF (SYRINGE) IMPLANT
TUBE CONNECTING 12X1/4 (SUCTIONS) ×2 IMPLANT
WATER STERILE IRR 3000ML UROMA (IV SOLUTION) ×2 IMPLANT
WATER STERILE IRR 500ML POUR (IV SOLUTION) ×2 IMPLANT

## 2020-10-31 NOTE — H&P (Signed)
H&P  Chief Complaint: Bladder tumor  History of Present Illness: 65 year old male presents at this time for TUR of a papillary lesion in his prostatic urethral/bladder neck area.  He does have a history of TURBT in November 2020.  This small papillary recurrence was recently seen on routine cystoscopy.  Past Medical History:  Diagnosis Date   Adopted    per pt unknown family medical history   Allergic rhinitis    Bladder cancer [facility])    urologist-- dr Diona Fanti---  TURBT 11/ 2020   Chronic sinusitis    GERD (gastroesophageal reflux disease)    pt denies   History of 2019 novel coronavirus disease (COVID-19) 07/09/2019   positive result in epic, per pt no symptoms   History of bronchoscopy 11/24/2019   per pt this was done due to abnormal chest CT, sob, and hx bronchospasm 2020;  normal bronchoscopy with no lesion and no etiology of bronchospasms   History of Helicobacter pylori infection 11/2019   treated   Seasonal allergies     Past Surgical History:  Procedure Laterality Date   COLONOSCOPY WITH ESOPHAGOGASTRODUODENOSCOPY (EGD)  12/16/2019   SEPTOPLASTY WITH ETHMOIDECTOMY, AND MAXILLARY ANTROSTOMY  09/13/2020   TRANSURETHRAL RESECTION OF BLADDER TUMOR WITH MITOMYCIN-C N/A 01/26/2019   Procedure: TRANSURETHRAL RESECTION OF BLADDER TUMOR WITH MITOMYCIN-C;  Surgeon: Franchot Gallo, MD;  [facility];  Service: Urology;  Laterality: N/A;   VIDEO BRONCHOSCOPY N/A 11/24/2019   Procedure: VIDEO BRONCHOSCOPY WITHOUT FLUORO;  Surgeon: Laurin Coder, MD;  Location: [facility];  Service: Pulmonary;  Laterality: N/A;    Home Medications:    Allergies: No Known Allergies  Family History  Adopted: Yes  Problem Relation Age of Onset   Colon cancer Neg Hx    Esophageal cancer Neg Hx    Rectal cancer Neg Hx    Stomach cancer Neg Hx    Colon polyps Neg Hx     Social History:  reports that he quit smoking about 48 years ago. His smoking use included  cigarettes. He has never used smokeless tobacco. He reports current alcohol use of about 42.0 standard drinks of alcohol per week. He reports that he does not use drugs.  ROS: A complete review of systems was performed.  All systems are negative except for pertinent findings as noted.  Physical Exam:  Vital signs in last 24 hours: BP (!) 150/90   Pulse 67   Temp 98.7 F (37.1 C) (Oral)   Resp 16   Ht 6' (1.829 m)   Wt 83.2 kg   SpO2 98%   BMI 24.89 kg/m  Constitutional:  Alert and oriented, No acute distress Cardiovascular: Regular rate  Respiratory: Normal respiratory effort GI: Abdomen is soft, nontender, nondistended, no abdominal masses. No CVAT.  Genitourinary: Normal male phallus, testes are descended bilaterally and non-tender and without masses, scrotum is normal in appearance without lesions or masses, perineum is normal on inspection. Lymphatic: No lymphadenopathy Neurologic: Grossly intact, no focal deficits Psychiatric: Normal mood and affect  Laboratory Data:  No results for input(s): WBC, HGB, HCT, PLT in the last 72 hours.  No results for input(s): NA, K, CL, GLUCOSE, BUN, CALCIUM, CREATININE in the last 72 hours.  Invalid input(s): CO3   No results found for this or any previous visit (from the past 24 hour(s)). No results found for this or any previous visit (from the past 240 hour(s)).  Renal Function: No results for input(s): CREATININE in the last 168 hours. CrCl  cannot be calculated (Patient's most recent lab result is older than the maximum 21 days allowed.).  Radiologic Imaging: No results found.  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

## 2020-10-31 NOTE — Anesthesia Procedure Notes (Signed)
Procedure Name: LMA Insertion Date/Time: 10/31/2020 8:47 AM Performed by: Georgeanne Nim, CRNA Pre-anesthesia Checklist: Patient identified, Emergency Drugs available, Suction available, Patient being monitored and Timeout performed Patient Re-evaluated:Patient Re-evaluated prior to induction Oxygen Delivery Method: Circle system utilized Preoxygenation: Pre-oxygenation with 100% oxygen Induction Type: IV induction Ventilation: Mask ventilation without difficulty LMA: LMA inserted LMA Size: 5.0 Number of attempts: 1 Placement Confirmation: positive ETCO2, CO2 detector and breath sounds checked- equal and bilateral Tube secured with: Tape Dental Injury: Teeth and Oropharynx as per pre-operative assessment

## 2020-10-31 NOTE — Anesthesia Postprocedure Evaluation (Signed)
Anesthesia Post Note  Patient: Billy Hernandez  Procedure(s) Performed: CYSTOSCOPY WITH TRANSURETRAL RESECTION OF  OF BLADDER NECK LESION (Bladder)     Patient location during evaluation: PACU Anesthesia Type: General Level of consciousness: sedated Pain management: pain level controlled Vital Signs Assessment: post-procedure vital signs reviewed and stable Respiratory status: spontaneous breathing and respiratory function stable Cardiovascular status: stable Postop Assessment: no apparent nausea or vomiting Anesthetic complications: no   No notable events documented.  Last Vitals:  Vitals:   10/31/20 0945 10/31/20 1005  BP: 138/70 (!) 155/85  Pulse: (!) 54 (!) 51  Resp: 12 14  Temp:  (!) 36.2 C  SpO2: 99% 100%    Last Pain:  Vitals:   10/31/20 1005  TempSrc:   PainSc: 0-No pain                 [name]

## 2020-10-31 NOTE — Op Note (Signed)
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  Bladder neck lesion.  Postoperative diagnosis: Same  Principal procedure: Cystoscopy, transurethral resection of bladder neck lesion  Surgeon: Fronia Depass  Anesthesia: General with LMA  Complications: None  Estimated blood loss: Less than 10 cc  Specimen: 1.  Bladder neck lesion 2.  Base of lesion  Drains: None  Indications: 65 year old male status post TURBT in 2020.  He was found to have nonmuscle invasive but high-grade bladder cancer.  He completed BCG induction and maintenance therapy.  Recent cystoscopy revealed a papillary lesion at the 6 o'clock position at the bladder neck.  No other urothelial lesions were noted within the bladder.  He presents at this time for resection.  Findings: Urethra was normal.  No stricture.  Prostate nonobstructive.  At the bladder neck at the 6 o'clock position there was a papillary lesion approximately 12 mm long.  There was normal surrounding urothelium.  No other urothelial lesions were noted in the bladder.  Old resection sites were evident and scarred.  Ureteral orifices were normally placed and had normal configuration.  Description of procedure: The patient was properly identified in the holding area.  Is taken to the operating room where general anesthetic was administered with the LMA.  He was placed in the dorsolithotomy position, genitalia and perineum were prepped, draped, IV antibiotics administered.  48 French resectoscope sheath passed using the visual obturator.  The above-mentioned findings were noted.  After thorough inspection of the bladder, the resectoscope and cutting loop were placed.  I then resected the bladder neck lesion into the prostatic stroma.  Specimens were sent labeled "bladder neck lesion".  At this point, deeper resection was performed and the fragments were sent for pathology labeled  "base of bladder neck lesion".  Coagulation was then used to provide hemostasis.  No significant bleeding was noted at this point.  Mild irregularity/telangiectasia was present in the distal prostatic urethra.  I then cauterized this area.  Careful inspection was then made of the bladder and prostatic urethra.  No significant bleeding was noted.  At this point, the bladder was mostly empty.  Scope was removed and procedure terminated.  The patient was awakened and taken to PACU in stable condition, having tolerated procedure well.

## 2020-10-31 NOTE — Discharge Instructions (Addendum)
You may see some blood in the urine and may have some burning with urination for 48-72 hours. You also may notice that you have to urinate more frequently or urgently after your procedure which is normal.  You should call should you develop an inability urinate, fever > 101, persistent nausea and vomiting that prevents you from eating or drinking to stay hydrated.  If you have a catheter, you will be taught how to take care of the catheter by the nursing staff prior to discharge from the hospital.  You may periodically feel a strong urge to void with the catheter in place.  This is a bladder spasm and most often can occur when having a bowel movement or moving around. It is typically self-limited and usually will stop after a few minutes.  You may use some Vaseline or Neosporin around the tip of the catheter to reduce friction at the tip of the penis. You may also see some blood in the urine.  A very small amount of blood can make the urine look quite red.  As long as the catheter is draining well, there usually is not a problem.  However, if the catheter is not draining well and is bloody, you should call the office [phone]) to notify us.  CYSTOSCOPY HOME CARE INSTRUCTIONS  Activity: Rest for the remainder of the day.  Do not drive or operate equipment today.  You may resume normal activities in one to two days as instructed by your physician.   Meals: Drink plenty of liquids and eat light foods such as gelatin or soup this evening.  You may return to a normal meal plan tomorrow.  Return to Work: You may return to work in one to two days or as instructed by your physician.  Special Instructions / Symptoms: Call your physician if any of these symptoms occur:   -persistent or heavy bleeding  -bleeding which continues after first few urination  -large blood clots that are difficult to pass  -urine stream diminishes or stops completely  -fever equal to or higher than 101 degrees  Farenheit.  -cloudy urine with a strong, foul odor  -severe pain  Females should always wipe from front to back after elimination.  You may feel some burning pain when you urinate.  This should disappear with time.  Applying moist heat to the lower abdomen or a hot tub bath may help relieve the pain.    Post Anesthesia Home Care Instructions  Activity: Get plenty of rest for the remainder of the day. A responsible individual must stay with you for 24 hours following the procedure.  For the next 24 hours, DO NOT: -Drive a car -Paediatric nurse -Drink alcoholic beverages -Take any medication unless instructed by your physician -Make any legal decisions or sign important papers.  Meals: Start with liquid foods such as gelatin or soup. Progress to regular foods as tolerated. Avoid greasy, spicy, heavy foods. If nausea and/or vomiting occur, drink only clear liquids until the nausea and/or vomiting subsides. Call your physician if vomiting continues.  Special Instructions/Symptoms: Your throat may feel dry or sore from the anesthesia or the breathing tube placed in your throat during surgery. If this causes discomfort, gargle with warm salt water. The discomfort should disappear within 24 hours.

## 2020-10-31 NOTE — Transfer of Care (Signed)
Immediate Anesthesia Transfer of Care Note  Patient: Billy Hernandez  Procedure(s) Performed: CYSTOSCOPY WITH TRANSURETRAL RESECTION OF  OF BLADDER NECK LESION (Bladder)  Patient Location: PACU  Anesthesia Type:General  Level of Consciousness: awake and patient cooperative  Airway & Oxygen Therapy: Patient Spontanous Breathing and Patient connected to nasal cannula oxygen  Post-op Assessment: Report given to RN and Post -op Vital signs reviewed and stable  Post vital signs: Reviewed and stable  Last Vitals:  Vitals Value Taken Time  BP    Temp    Pulse 71 10/31/20 0915  Resp 9 10/31/20 0915  SpO2 99 % 10/31/20 0915  Vitals shown include unvalidated device data.  Last Pain:  Vitals:   10/31/20 0651  TempSrc: Oral  PainSc: 0-No pain      Patients Stated Pain Goal: 4 ([id number])  Complications: No notable events documented.

## 2020-11-01 ENCOUNTER — Encounter (HOSPITAL_BASED_OUTPATIENT_CLINIC_OR_DEPARTMENT_OTHER): Payer: Self-pay | Admitting: Urology

## 2020-11-01 LAB — SURGICAL PATHOLOGY

## 2021-10-09 IMAGING — CT CT CHEST W/O CM
2 of 4 series · 15 of 36 positions shown, 18 images · non-contrast
Comparison: 04/10/2018

CLINICAL DATA: Emphysema, bronchospasms in 9595, former smoker

EXAM:
CT CHEST WITHOUT CONTRAST
TECHNIQUE: Multidetector CT imaging of the chest was performed following the
standard protocol without IV contrast. Sagittal and coronal MPR
images reconstructed from axial data set.

[Series 2: thorax · axial · 0.76mm/px · z∈[-357,-53]mm · 12 of 180 slices shown, 15 images]
[im 14/180  mediastinal]
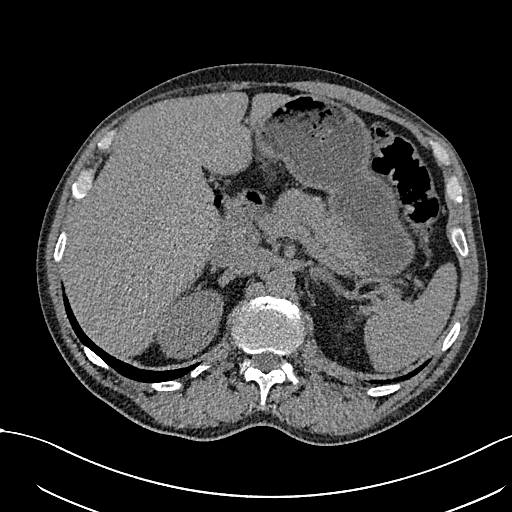
[im 14/180  lung]
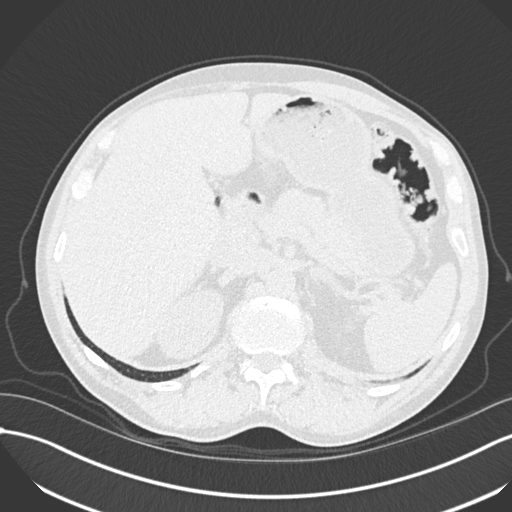
[im 28/180  lung]
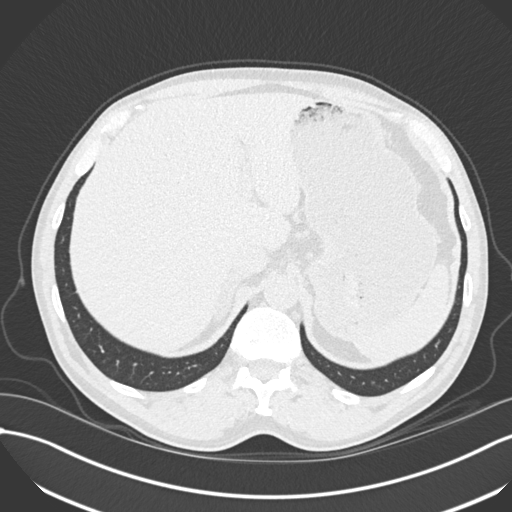
[im 42/180  lung]
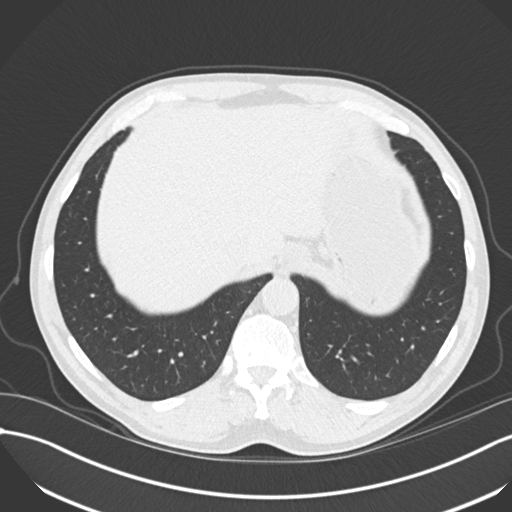
[im 56/180  lung]
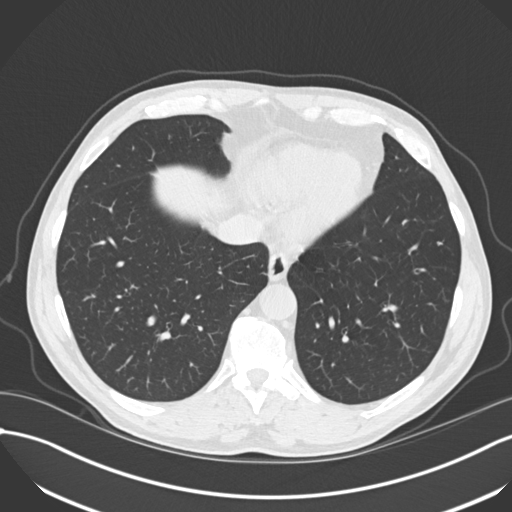
[im 69/180  mediastinal]
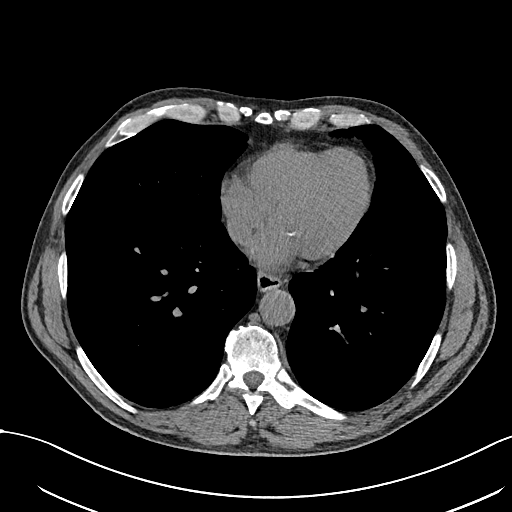
[im 69/180  lung]
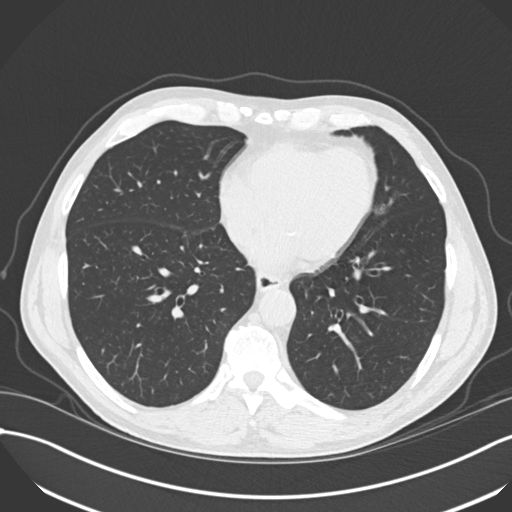
[im 83/180  lung]
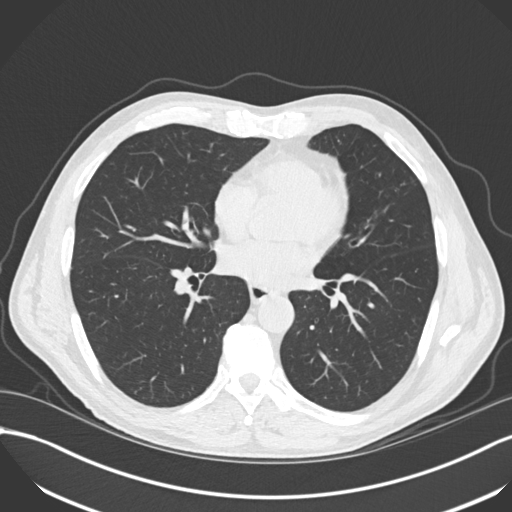
[im 97/180  lung]
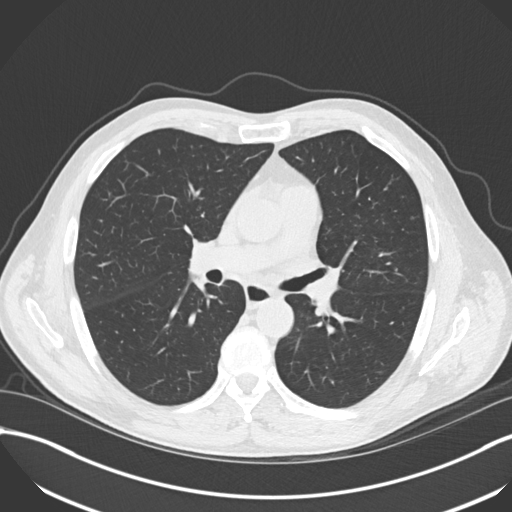
[im 111/180  lung]
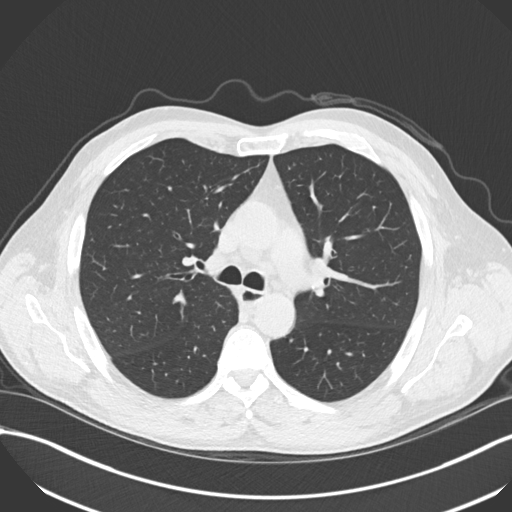
[im 124/180  mediastinal]
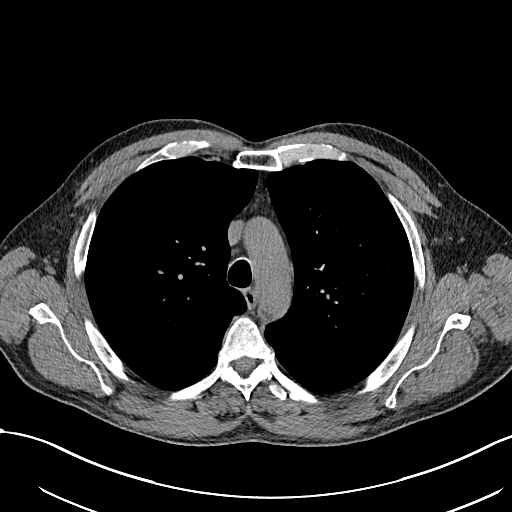
[im 124/180  lung]
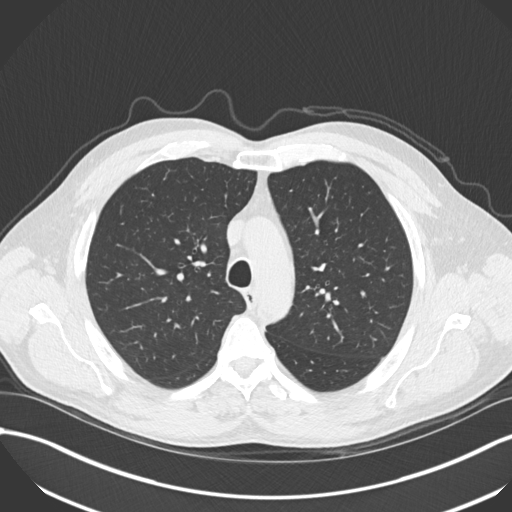
[im 138/180  lung]
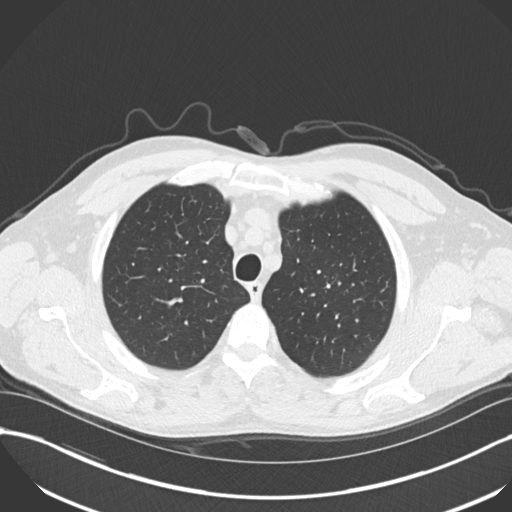
[im 152/180  lung]
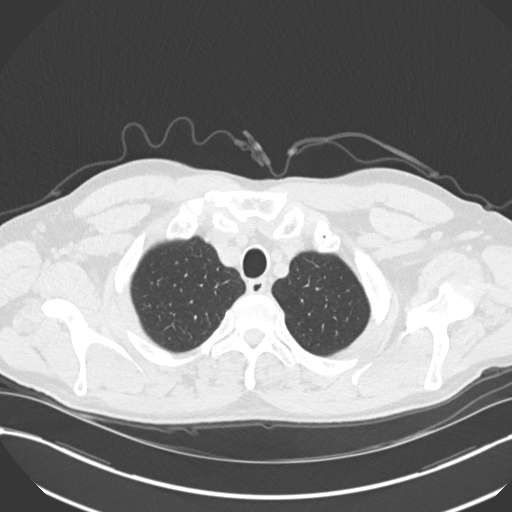
[im 166/180  lung]
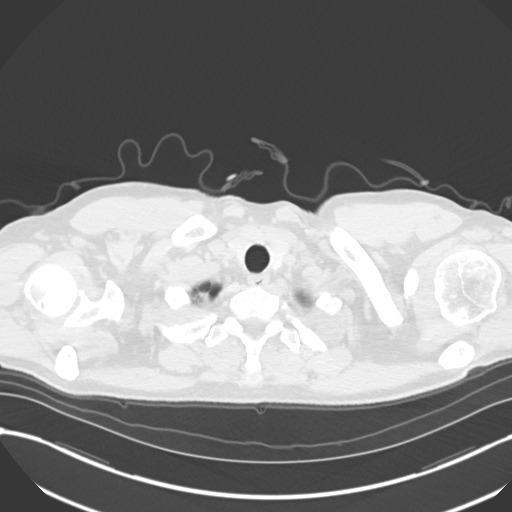

[Series 5: coronal · coronal · 0.70mm/px · 3 of 134 slices shown]
[im 27/134  lung]
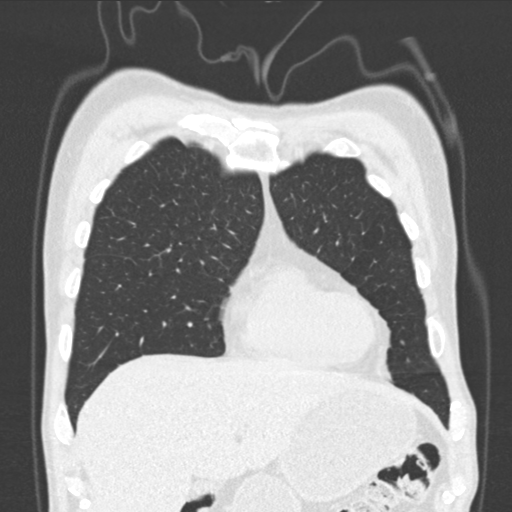
[im 54/134  lung]
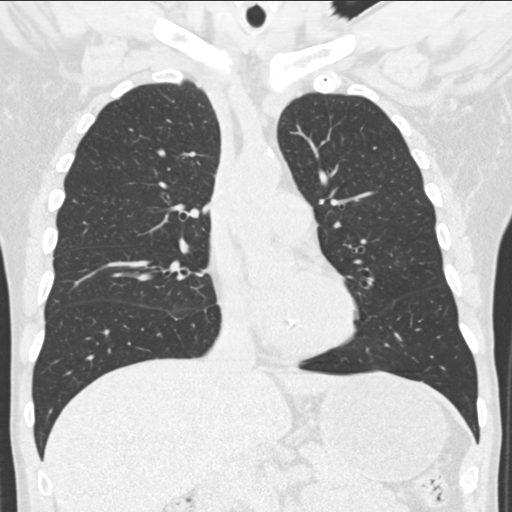
[im 80/134  lung]
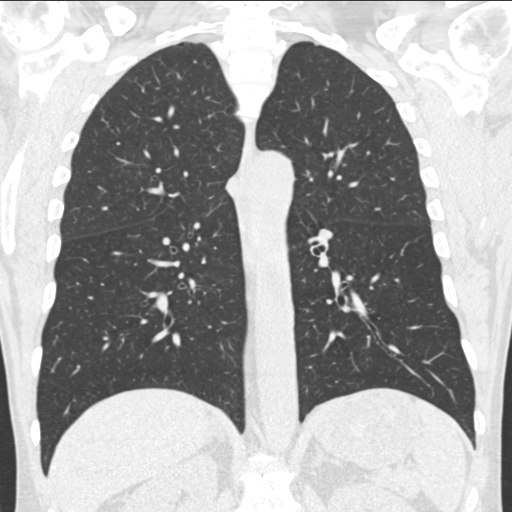

[15 of 36 positions shown; findings below may reference images not displayed]

FINDINGS: Cardiovascular: Atherosclerotic calcifications aorta and minimally
at proximal great vessels. Aorta normal caliber. Heart unremarkable.
Small amount of mitral annular calcification is noted.

Mediastinum/Nodes: Base of cervical region normal appearance.
Scattered normal sized mediastinal lymph nodes. No definite thoracic
adenopathy. Esophagus normal appearance.

Lungs/Pleura: 6 x 2 mm endobronchial nodular focus at the origin of
the RIGHT mainstem bronchus image 66, question mass versus mucous.
Remaining airways patent. Calcified granulomata in the lower lobes
bilaterally greater on LEFT and in lingula. Bronchiectasis in
lingula with minimal residual peribronchial opacity. No acute
infiltrate, pleural effusion, or pneumothorax. No developing
pulmonary mass/nodule.

Upper Abdomen: Slight thickening of adrenal glands without focal
mass. Visualized upper abdomen otherwise unremarkable.

Musculoskeletal: No acute osseous findings.
IMPRESSION: Chronic bronchitic changes with stable likely post inflammatory
changes in the lingula.

Potential new 6 x 2 mm endobronchial nodule at the origin of the
RIGHT mainstem bronchus versus mucous; recommend bronchoscopy
assessment to exclude developing tumor.

Aortic Atherosclerosis ([id number]-CHA.A).

Insert Javad Bunn report

## 2022-09-26 ENCOUNTER — Other Ambulatory Visit: Payer: Self-pay | Admitting: Urology

## 2022-10-02 NOTE — Patient Instructions (Signed)
SURGICAL WAITING ROOM VISITATION  Patients having surgery or a procedure may have no more than 2 support people in the waiting area - these visitors may rotate.    Children under the age of 66 must have an adult with them who is not the patient.  Due to an increase in RSV and influenza rates and associated hospitalizations, children ages 39 and under may not visit patients in [facility] hospitals.  If the patient needs to stay at the hospital during part of their recovery, the visitor guidelines for inpatient rooms apply. Pre-op nurse will coordinate an appropriate time for 1 support person to accompany patient in pre-op.  This support person may not rotate.    Please refer to the [facility] website for the visitor guidelines for Inpatients (after your surgery is over and you are in a regular room).       Your procedure is scheduled on: 10/12/2022    Report to [facility] Main Entrance    Report to admitting at  0845 AM   Call this number if you have problems the morning of surgery [phone]   Do not eat food  or drink liquids :After Midnight.              If you have questions, please contact your surgeon's office.        Oral Hygiene is also important to reduce your risk of infection.                                    Remember - BRUSH YOUR TEETH THE MORNING OF SURGERY WITH YOUR REGULAR TOOTHPASTE  DENTURES WILL BE REMOVED PRIOR TO SURGERY PLEASE DO NOT APPLY "Poly grip" OR ADHESIVES!!!   Do NOT smoke after Midnight   Take these medicines the morning of surgery with A SIP OF WATER:  buspar   DO NOT TAKE ANY ORAL DIABETIC MEDICATIONS DAY OF YOUR SURGERY  Bring CPAP mask and tubing day of surgery.                              You may not have any metal on your body including hair pins, jewelry, and body piercing             Do not wear make-up, lotions, powders, perfumes/cologne, or deodorant  Do not wear nail polish including gel and S&S,  artificial/acrylic nails, or any other type of covering on natural nails including finger and toenails. If you have artificial nails, gel coating, etc. that needs to be removed by a nail salon please have this removed prior to surgery or surgery may need to be canceled/ delayed if the surgeon/ anesthesia feels like they are unable to be safely monitored.   Do not shave  48 hours prior to surgery.               Men may shave face and neck.   Do not bring valuables to the hospital. [facility] IS NOT             RESPONSIBLE   FOR VALUABLES.   Contacts, glasses, dentures or bridgework may not be worn into surgery.   Bring small overnight bag day of surgery.   DO NOT BRING YOUR HOME MEDICATIONS TO THE HOSPITAL. PHARMACY WILL DISPENSE MEDICATIONS LISTED ON YOUR MEDICATION LIST TO YOU DURING YOUR ADMISSION IN THE  HOSPITAL!    Patients discharged on the day of surgery will not be allowed to drive home.  Someone NEEDS to stay with you for the first 24 hours after anesthesia.   Special Instructions: Bring a copy of your healthcare power of attorney and living will documents the day of surgery if you haven't scanned them before.              Please read over the following fact sheets you were given: IF YOU HAVE QUESTIONS ABOUT YOUR PRE-OP INSTRUCTIONS PLEASE CALL [phone]   If you received a COVID test during your pre-op visit  it is requested that you wear a mask when out in public, stay away from anyone that may not be feeling well and notify your surgeon if you develop symptoms. If you test positive for Covid or have been in contact with anyone that has tested positive in the last 10 days please notify you surgeon.    [place] - Preparing for Surgery Before surgery, you can play an important role.  Because skin is not sterile, your skin needs to be as free of germs as possible.  You can reduce the number of germs on your skin by washing with CHG (chlorahexidine gluconate) soap before  surgery.  CHG is an antiseptic cleaner which kills germs and bonds with the skin to continue killing germs even after washing. Please DO NOT use if you have an allergy to CHG or antibacterial soaps.  If your skin becomes reddened/irritated stop using the CHG and inform your nurse when you arrive at Short Stay. Do not shave (including legs and underarms) for at least 48 hours prior to the first CHG shower.  You may shave your face/neck. Please follow these instructions carefully:  1.  Shower with CHG Soap the night before surgery and the  morning of Surgery.  2.  If you choose to wash your hair, wash your hair first as usual with your  normal  shampoo.  3.  After you shampoo, rinse your hair and body thoroughly to remove the  shampoo.                           4.  Use CHG as you would any other liquid soap.  You can apply chg directly  to the skin and wash                       Gently with a scrungie or clean washcloth.  5.  Apply the CHG Soap to your body ONLY FROM THE NECK DOWN.   Do not use on face/ open                           Wound or open sores. Avoid contact with eyes, ears mouth and genitals (private parts).                       Wash face,  Genitals (private parts) with your normal soap.             6.  Wash thoroughly, paying special attention to the area where your surgery  will be performed.  7.  Thoroughly rinse your body with warm water from the neck down.  8.  DO NOT shower/wash with your normal soap after using and rinsing off  the CHG Soap.  9.  Pat yourself dry with a clean towel.            10.  Wear clean pajamas.            11.  Place clean sheets on your bed the night of your first shower and do not  sleep with pets. Day of Surgery : Do not apply any lotions/deodorants the morning of surgery.  Please wear clean clothes to the hospital/surgery center.  FAILURE TO FOLLOW THESE INSTRUCTIONS MAY RESULT IN THE CANCELLATION OF YOUR SURGERY PATIENT  SIGNATURE_________________________________  NURSE SIGNATURE__________________________________  ________________________________________________________________________

## 2022-10-02 NOTE — Progress Notes (Addendum)
Anesthesia Review:  [id number]:[id number] [place] ?  Cardiologist  none  Chest x-ray : EKG : 10/03/22 Echo : Stress test: Cardiac Cath :  Activity level: can do a flight of stairs without difficulty  Sleep Study/ CPAP : none  Fasting Blood Sugar :      / Checks Blood Sugar -- times a day:   Blood Thinner/ Instructions /Last Dose: ASA / Instructions/ Last Dose :    Recently placed on blood pressure med ( approx 3 days ago at time of preop appt).  Placed on lisinopril will call [facility] on [address] for dose .

## 2022-10-03 ENCOUNTER — Encounter (HOSPITAL_COMMUNITY): Payer: Self-pay

## 2022-10-03 ENCOUNTER — Other Ambulatory Visit: Payer: Self-pay

## 2022-10-03 ENCOUNTER — Encounter (HOSPITAL_COMMUNITY)
Admission: RE | Admit: 2022-10-03 | Discharge: 2022-10-03 | Disposition: A | Discharge status: Home / Self Care | Payer: Medicare HMO | Source: Ambulatory Visit | Attending: Urology | Admitting: Urology

## 2022-10-03 VITALS — BP 158/86 | HR 67 | Temp 98.7°F | Resp 16 | Ht 72.0 in | Wt 180.0 lb

## 2022-10-03 DIAGNOSIS — Z01818 Encounter for other preprocedural examination: Secondary | ICD-10-CM | POA: Insufficient documentation

## 2022-10-03 HISTORY — DX: Essential (primary) hypertension: I10

## 2022-10-03 HISTORY — DX: Anxiety disorder, unspecified: F41.9

## 2022-10-03 LAB — CBC
HCT: 38.1 % — ABNORMAL LOW (ref 39.0–52.0)
Hemoglobin: 12.9 g/dL — ABNORMAL LOW (ref 13.0–17.0)
MCH: 32.6 pg (ref 26.0–34.0)
MCHC: 33.9 g/dL (ref 30.0–36.0)
MCV: 96.2 fL (ref 80.0–100.0)
Platelets: 279 10*3/uL (ref 150–400)
RBC: 3.96 MIL/uL — ABNORMAL LOW (ref 4.22–5.81)
RDW: 12.2 % (ref 11.5–15.5)
WBC: 5.6 10*3/uL (ref 4.0–10.5)
nRBC: 0 % (ref 0.0–0.2)

## 2022-10-03 LAB — BASIC METABOLIC PANEL
Anion gap: 10 (ref 5–15)
BUN: 10 mg/dL (ref 8–23)
CO2: 24 mmol/L (ref 22–32)
Calcium: 9 mg/dL (ref 8.9–10.3)
Chloride: 97 mmol/L — ABNORMAL LOW (ref 98–111)
Creatinine, Ser: 1.03 mg/dL (ref 0.61–1.24)
GFR, Estimated: >60 mL/min (ref >60)
Glucose, Bld: 95 mg/dL (ref 70–99)
Potassium: 4.4 mmol/L (ref 3.5–5.1)
Sodium: 131 mmol/L — ABNORMAL LOW (ref 135–145)

## 2022-10-04 ENCOUNTER — Encounter (HOSPITAL_COMMUNITY): Payer: Self-pay

## 2022-10-04 ENCOUNTER — Encounter (HOSPITAL_COMMUNITY): Payer: Self-pay | Admitting: Medical

## 2022-10-15 ENCOUNTER — Other Ambulatory Visit: Payer: Self-pay | Admitting: Urology

## 2022-10-19 NOTE — Progress Notes (Signed)
Called pt on 10/19/22 in regards to new date and time of surgery. Pt reports new changes in medical hx and no updates in meds.  Pt still has copy of preop instructions at home.  Pt aware of new date of 10/24/22 with arrival time of 1045.  PT voices understanding.  PT aware to follow preop instructions and to arrive at 1045am.

## 2022-10-23 ENCOUNTER — Other Ambulatory Visit: Payer: Self-pay | Admitting: Urology

## 2022-10-24 ENCOUNTER — Encounter (HOSPITAL_COMMUNITY): Payer: Self-pay | Admitting: Urology

## 2022-10-24 ENCOUNTER — Ambulatory Visit (HOSPITAL_COMMUNITY): Payer: Medicare HMO

## 2022-10-24 ENCOUNTER — Ambulatory Visit (HOSPITAL_COMMUNITY)
Admission: RE | Admit: 2022-10-24 | Discharge: 2022-10-24 | Disposition: A | Discharge status: Home / Self Care | Payer: Medicare HMO | Source: Ambulatory Visit | Attending: Urology | Admitting: Urology

## 2022-10-24 ENCOUNTER — Encounter (HOSPITAL_COMMUNITY)
Admission: RE | Disposition: A | Discharge status: Home / Self Care | Payer: Self-pay | Source: Ambulatory Visit | Attending: Urology

## 2022-10-24 ENCOUNTER — Ambulatory Visit (HOSPITAL_BASED_OUTPATIENT_CLINIC_OR_DEPARTMENT_OTHER): Payer: Medicare HMO

## 2022-10-24 DIAGNOSIS — Z87891 Personal history of nicotine dependence: Secondary | ICD-10-CM | POA: Diagnosis not present

## 2022-10-24 DIAGNOSIS — R310 Gross hematuria: Secondary | ICD-10-CM | POA: Diagnosis not present

## 2022-10-24 DIAGNOSIS — C672 Malignant neoplasm of lateral wall of bladder: Secondary | ICD-10-CM | POA: Diagnosis not present

## 2022-10-24 DIAGNOSIS — N2889 Other specified disorders of kidney and ureter: Secondary | ICD-10-CM | POA: Diagnosis present

## 2022-10-24 DIAGNOSIS — D4959 Neoplasm of unspecified behavior of other genitourinary organ: Secondary | ICD-10-CM

## 2022-10-24 DIAGNOSIS — C676 Malignant neoplasm of ureteric orifice: Secondary | ICD-10-CM | POA: Insufficient documentation

## 2022-10-24 DIAGNOSIS — R351 Nocturia: Secondary | ICD-10-CM | POA: Insufficient documentation

## 2022-10-24 HISTORY — PX: TRANSURETHRAL RESECTION OF BLADDER TUMOR: SHX2575

## 2022-10-24 HISTORY — PX: CYSTOSCOPY/URETEROSCOPY/HOLMIUM LASER/STENT PLACEMENT: SHX6546

## 2022-10-24 SURGERY — CYSTOSCOPY/URETEROSCOPY/HOLMIUM LASER/STENT PLACEMENT
Anesthesia: General | Laterality: Right

## 2022-10-24 MED ORDER — ACETAMINOPHEN 10 MG/ML IV SOLN: 1000.0000 mg | Freq: Once | INTRAVENOUS | Status: DC | PRN

## 2022-10-24 MED ORDER — CEFAZOLIN SODIUM-DEXTROSE 2-4 GM/100ML-% IV SOLN
2.0000 g | INTRAVENOUS | Status: AC
  Administered 2022-10-24: 2 g via INTRAVENOUS

## 2022-10-24 MED ORDER — FENTANYL CITRATE PF 50 MCG/ML IJ SOSY: 25.0000 ug | PREFILLED_SYRINGE | INTRAMUSCULAR | Status: DC | PRN

## 2022-10-24 MED ORDER — DEXAMETHASONE SODIUM PHOSPHATE 4 MG/ML IJ SOLN
INTRAMUSCULAR | Status: DC | PRN
  Administered 2022-10-24: 10 mg via INTRAVENOUS

## 2022-10-24 MED ORDER — 0.9 % SODIUM CHLORIDE (POUR BTL) OPTIME
TOPICAL | Status: DC | PRN
  Administered 2022-10-24: 1000 mL

## 2022-10-24 MED ORDER — SODIUM CHLORIDE 0.9 % IR SOLN
Status: DC | PRN
  Administered 2022-10-24: 9000 mL

## 2022-10-24 MED ORDER — LACTATED RINGERS IV SOLN: INTRAVENOUS | Status: DC

## 2022-10-24 MED ORDER — LIDOCAINE HCL (CARDIAC) PF 100 MG/5ML IV SOSY
PREFILLED_SYRINGE | INTRAVENOUS | Status: DC | PRN
  Administered 2022-10-24: 60 mg via INTRAVENOUS

## 2022-10-24 MED ORDER — ONDANSETRON HCL 4 MG/2ML IJ SOLN
INTRAMUSCULAR | Status: AC
  Filled 2022-10-24: qty 2

## 2022-10-24 MED ORDER — PROPOFOL 10 MG/ML IV BOLUS
INTRAVENOUS | Status: DC | PRN
  Administered 2022-10-24: 200 mg via INTRAVENOUS

## 2022-10-24 MED ORDER — LIDOCAINE HCL (PF) 2 % IJ SOLN
INTRAMUSCULAR | Status: AC
  Filled 2022-10-24: qty 5

## 2022-10-24 MED ORDER — IOHEXOL 300 MG/ML  SOLN
INTRAMUSCULAR | Status: DC | PRN
  Administered 2022-10-24: 20 mL

## 2022-10-24 MED ORDER — PROPOFOL 10 MG/ML IV BOLUS
INTRAVENOUS | Status: AC
  Filled 2022-10-24: qty 20

## 2022-10-24 MED ORDER — KETOROLAC TROMETHAMINE 30 MG/ML IJ SOLN
INTRAMUSCULAR | Status: DC | PRN
  Administered 2022-10-24: 30 mg via INTRAVENOUS

## 2022-10-24 MED ORDER — MIDAZOLAM HCL 5 MG/5ML IJ SOLN
INTRAMUSCULAR | Status: DC | PRN
  Administered 2022-10-24: 2 mg via INTRAVENOUS

## 2022-10-24 MED ORDER — OXYCODONE-ACETAMINOPHEN 5-325 MG PO TABS: 1.0000 | ORAL_TABLET | ORAL | 0 refills | Status: DC | PRN

## 2022-10-24 MED ORDER — MIDAZOLAM HCL 2 MG/2ML IJ SOLN
INTRAMUSCULAR | Status: AC
  Filled 2022-10-24: qty 2

## 2022-10-24 MED ORDER — FENTANYL CITRATE (PF) 100 MCG/2ML IJ SOLN
INTRAMUSCULAR | Status: AC
  Filled 2022-10-24: qty 2

## 2022-10-24 MED ORDER — FENTANYL CITRATE (PF) 100 MCG/2ML IJ SOLN
INTRAMUSCULAR | Status: DC | PRN
  Administered 2022-10-24 (×2): 25 ug via INTRAVENOUS
  Administered 2022-10-24: 50 ug via INTRAVENOUS

## 2022-10-24 MED ORDER — ONDANSETRON HCL 4 MG/2ML IJ SOLN
INTRAMUSCULAR | Status: DC | PRN
  Administered 2022-10-24: 4 mg via INTRAVENOUS

## 2022-10-24 MED ORDER — KETOROLAC TROMETHAMINE 30 MG/ML IJ SOLN
INTRAMUSCULAR | Status: AC
  Filled 2022-10-24: qty 1

## 2022-10-24 MED ORDER — DEXAMETHASONE SODIUM PHOSPHATE 10 MG/ML IJ SOLN
INTRAMUSCULAR | Status: AC
  Filled 2022-10-24: qty 1

## 2022-10-24 SURGICAL SUPPLY — 36 items
APL SKNCLS STERI-STRIP NONHPOA (GAUZE/BANDAGES/DRESSINGS)
BAG DRN RND TRDRP ANRFLXCHMBR (UROLOGICAL SUPPLIES)
BAG URINE DRAIN 2000ML AR STRL (UROLOGICAL SUPPLIES) IMPLANT
BAG URO CATCHER STRL LF (MISCELLANEOUS) ×2 IMPLANT
BASKET ZERO TIP NITINOL 2.4FR (BASKET) IMPLANT
BENZOIN TINCTURE PRP APPL 2/3 (GAUZE/BANDAGES/DRESSINGS) IMPLANT
BSKT STON RTRVL ZERO TP 2.4FR (BASKET) ×2
CATH URETERAL DUAL LUMEN 10F (MISCELLANEOUS) IMPLANT
CATH URETL OPEN 5X70 (CATHETERS) ×2 IMPLANT
CLOTH BEACON ORANGE TIMEOUT ST (SAFETY) ×2 IMPLANT
DRAPE FOOT SWITCH (DRAPES) ×2 IMPLANT
DRSG TEGADERM 2-3/8X2-3/4 SM (GAUZE/BANDAGES/DRESSINGS) IMPLANT
ELECT REM PT RETURN 15FT ADLT (MISCELLANEOUS) ×2 IMPLANT
EVACUATOR MICROVAS BLADDER (UROLOGICAL SUPPLIES) IMPLANT
FIBER LASER MOSES 200 DFL (Laser) IMPLANT
GLOVE BIOGEL M 7.0 STRL (GLOVE) ×2 IMPLANT
GLOVE SURG LX STRL 7.5 STRW (GLOVE) ×2 IMPLANT
GOWN STRL REUS W/ TWL XL LVL3 (GOWN DISPOSABLE) ×2 IMPLANT
GOWN STRL REUS W/TWL XL LVL3 (GOWN DISPOSABLE) ×2
GUIDEWIRE STR DUAL SENSOR (WIRE) ×4 IMPLANT
GUIDEWIRE ZIPWRE .038 STRAIGHT (WIRE) IMPLANT
HOLDER FOLEY CATH W/STRAP (MISCELLANEOUS) IMPLANT
KIT TURNOVER KIT A (KITS) IMPLANT
LASER FIB FLEXIVA PULSE ID 365 (Laser) IMPLANT
LOOP CUT BIPOLAR 24F LRG (ELECTROSURGICAL) IMPLANT
MANIFOLD NEPTUNE II (INSTRUMENTS) ×2 IMPLANT
PACK CYSTO (CUSTOM PROCEDURE TRAY) ×2 IMPLANT
SHEATH DILATOR SET 8/10 (MISCELLANEOUS) IMPLANT
SHEATH NAVIGATOR HD 12/14X46 (SHEATH) IMPLANT
STENT URET 6FRX28 CONTOUR (STENTS) IMPLANT
SYR TOOMEY IRRIG 70ML (MISCELLANEOUS)
SYRINGE TOOMEY IRRIG 70ML (MISCELLANEOUS) IMPLANT
TRACTIP FLEXIVA PULS ID 200XHI (Laser) IMPLANT
TRACTIP FLEXIVA PULSE ID 200 (Laser)
TUBING CONNECTING 10 (TUBING) ×2 IMPLANT
TUBING UROLOGY SET (TUBING) ×2 IMPLANT

## 2022-10-24 NOTE — Op Note (Addendum)
Operative Note  Preoperative diagnosis:  1.  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  TURBT small 2. Right ureteroscopy with ureteral biopsy 3. Right retrograde pyelogram 4. Right ureteral stent placement 5. Fluoroscopy  Surgeon: Jettie Pagan, MD  Assistants:  None  Anesthesia:  General  Complications:  None  EBL:  Minimal  Specimens: 1.  ID Type Source Tests Collected by Time Destination  1 : Right Lateral Wall Bladder Lesion Tissue PATH GU tumor resection SURGICAL PATHOLOGY Jannifer Hick, MD 10/24/2022 1328   2 : Right Ureteral tumor Tissue PATH GU tumor resection SURGICAL PATHOLOGY Jannifer Hick, MD 10/24/2022 1331    Drains/Catheters: 1.  Right 6 French by 28 cm ureteral stent  Intraoperative findings:   Small area of raised erythema posterior lateral to the right ureteral orifice with questionable cancer recurrence measuring 1cm. This area was resected entirely.  Necrotic appearing papillary mass protruding from the right ureteral orifice was biopsied using cold cup separately ZeroTip basket with the aid of ureteroscope.  Adequate specimen. Right retrograde pyelogram with severe right-sided hydro and ureteral nephrosis to the level of the distal right ureter.  The renal pelvis.  Tortuous proximal right ureter.  Successful right ureteral stent placement.  Excellent hemostasis.  Indication:  ABBY STINES is a 67 y.o. 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  He recently underwent CT imaging demonstrated a distal right ureteral mass and is here for biopsy to obtain definitive pathology. All the risks, benefits were discussed with the patient to include but not limited to infection, pain, bleeding, damage to adjacent structures, need for further operations,  adverse reaction to anesthesia and death.  Patient understands these risks and agrees to proceed with the operation as planned.    Description of procedure: After informed consent was obtained from the patient, the patient was taken to the operating room. General anesthesia was administered. The patient was placed in dorsal lithotomy position and prepped and draped in usual sterile fashion. Sequential compression devices were applied to lower extremities at the beginning of the case for DVT prophylaxis. Antibiotics were infused prior to surgery start time. A surgical time-out was performed to properly identify the patient, the surgery to be performed, and the surgical site.     We then passed the 21-French rigid cystoscope down the urethra and into the bladder under direct vision without any difficulty. The anterior urethral was normal. The prostate was non-obstructing. The bladder was inspected with 30 and 70 degree lenses. Once in the bladder, systematic evaluation of bladder revealed an area of raised ureteral posterior lateral to the right ureteral orifice questionable for recurrent bladder tumor.  He had a large papillary necrotic mass protruding from the right ureteral orifice.  We then removed the cystoscope and then passed down the 26 French resectoscope sheath down the urethra into the bladder under direct vision with the visual obturator. The right lateral bladder wall tumor that measured about 1 cm was resected down to muscle. The TUR bladder tumor chips were retrieved from the bladder and each region of resection was passed off the field as a separate specimen.  Hemostasis was achieved using electrocautery.   Next, I used a cold cup biopsy to biopsy treating with renal mass.  This was sent  as a separate pathology.  Next, I was able to pass a sensor wire into the right renal pelvis and performed a right retrograde pyelogram demonstrating findings as above with severe hydroureteronephrosis with  tortuosity of the ureter.  I left a sensor wire in place and advanced a semirigid ureteroscope and obtained a biopsy of the distal regular mass using a 0 tip basket.  Excellent hemostasis was achieved.  Placed a 6 [place] by 26 cm right ureteral stent with a curl within the renal pelvis and bladder respectively.  There was a darkish colored urine draining through the stent likely due to the chronically obstructed right kidney.  Plan: He will follow-up with me to review his pathology and discuss next surgical steps.  Matt R.  MD [facility]  Pager: [phone]

## 2022-10-24 NOTE — Transfer of Care (Signed)
Immediate Anesthesia Transfer of Care Note  Patient: Billy Hernandez  Procedure(s) Performed: CYSTOSCOPY/RIGHT URETEROSCOPY WITH  BIOPSY/RIGHT RETROGRADE PYELOGRAM/RIGHT STENT PLACEMENT (Right) TRANSURETHRAL RESECTION OF BLADDER TUMOR (TURBT)  Patient Location: PACU  Anesthesia Type:General  Level of Consciousness: awake and alert   Airway & Oxygen Therapy: Patient Spontanous Breathing and Patient connected to face mask oxygen  Post-op Assessment: Report given to RN and Post -op Vital signs reviewed and stable  Post vital signs: Reviewed and stable  Last Vitals:  Vitals Value Taken Time  BP 156/78 10/24/22 1406  Temp    Pulse 55 10/24/22 1407  Resp 12 10/24/22 1407  SpO2 100 % 10/24/22 1407  Vitals shown include unfiled device data.  Last Pain:  Vitals:   10/24/22 1105  TempSrc:   PainSc: 0-No pain      Patients Stated Pain Goal: 3 (10/24/22 1105)  Complications: No notable events documented.

## 2022-10-24 NOTE — Anesthesia Preprocedure Evaluation (Signed)
Anesthesia Evaluation  Patient identified by MRN, date of birth, ID band Patient awake    Reviewed: Allergy & Precautions, NPO status , Patient's Chart, lab work & pertinent test results  Airway Mallampati: II  TM Distance: >3 FB Neck ROM: Full    Dental no notable dental hx.    Pulmonary neg pulmonary ROS, former smoker   Pulmonary exam normal        Cardiovascular hypertension, Pt. on medications  Rhythm:Regular Rate:Normal     Neuro/Psych   Anxiety        GI/Hepatic Neg liver ROS,GERD  ,,  Endo/Other  negative endocrine ROS    Renal/GU Right ureteral tumor   negative genitourinary   Musculoskeletal negative musculoskeletal ROS (+)    Abdominal Normal abdominal exam  (+)   Peds  Hematology Lab Results      Component                Value               Date                      WBC                      5.6                 10/03/2022                HGB                      12.9 (L)            10/03/2022                HCT                      38.1 (L)            10/03/2022                MCV                      96.2                10/03/2022                PLT                      279                 10/03/2022              Anesthesia Other Findings   Reproductive/Obstetrics                             Anesthesia Physical Anesthesia Plan  ASA: 2  Anesthesia Plan: General   Post-op Pain Management:    Induction:   PONV Risk Score and Plan: 2 and Ondansetron, Dexamethasone and Treatment may vary due to age or medical condition  Airway Management Planned: Mask and LMA  Additional Equipment: None  Intra-op Plan:   Post-operative Plan: Extubation in OR  Informed Consent: I have reviewed the patients History and Physical, chart, labs and discussed the procedure including the risks, benefits and alternatives for the proposed anesthesia with the patient or authorized representative  who has indicated his/her understanding and  acceptance.     Dental advisory given  Plan Discussed with: CRNA  Anesthesia Plan Comments:        Anesthesia Quick Evaluation

## 2022-10-24 NOTE — Discharge Instructions (Signed)
Activity:  You are encouraged to ambulate frequently (about every hour during waking hours) to help prevent blood clots from forming in your legs or lungs.   ? ?Diet: You should advance your diet as instructed by your physician.  It will be normal to have some bloating, nausea, and abdominal discomfort intermittently. ? ?Prescriptions:  You will be provided a prescription for pain medication to take as needed.  If your pain is not severe enough to require the prescription pain medication, you may take extra strength Tylenol instead which will have less side effects.  You should also take a prescribed stool softener to avoid straining with bowel movements as the prescription pain medication may constipate you. ? ?What to call us about: You should call the office ([phone]) if you develop fever > 101 or develop persistent vomiting. Activity:  You are encouraged to ambulate frequently (about every hour during waking hours) to help prevent blood clots from forming in your legs or lungs.  ?

## 2022-10-24 NOTE — H&P (Signed)
Office Visit Report     09/25/2022   --------------------------------------------------------------------------------   Densil Lare  MRN: [id number]  DOB: 09-05-1955, 67 year old Male  SSN:    PRIMARY CARE:  Ahunna N. Fredric Dine, MD  PRIMARY CARE FAX:  [phone]  REFERRING:  Jannifer Hick, MD  PROVIDER:  Jettie Pagan, M.D.  LOCATION:  [facility] 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 He was previously followed by Dr. Retta Diones.   1. High risk nonmuscle invasive bladder cancer:  Presented with gross hematuria. Only smoked as young child. No family history of urologic malignancy. CT hematuria protocol 12/2018 with 3.5 cm mass in the posterior right bladder.  -S/p TURBT 01/2019 with gemcitabine for 3.5cm right trigonal tumor. Path: HG T1 UCB (muscle present and uninvolved.  -Completed 6/6 BCG induction in 04/2019 and completed 3 maintenance BCG cycles.  -S/p TURBT 10/2020 of a small papillary lesion at the bladder neck/prostatic junction. Pathology: HG Ta UCB. Base of bladder resection revealed no involvement.  -Did not undergo BCG following TURBT in 10/2020  -Cystoscopy 09/25/2022 with tumor protruding from the right ureteral orifice with adjacent mass affect from the distal right ureter. No papillary lesions within the bladder itself.   2. Distal right ureteral mass:  -CT A/P 09/07/2022 with enhancing soft tissue in the distal right ureter extending to the UVJ suspicious for urothelial neoplasm. Also associated moderate to right severe hydroureteronephrosis with right renal cortical atrophy.  -Cystoscopy 09/25/2022 with tumor protruding from the right ureteral orifice with adjacent mass affect from the distal right ureter.   #2. Left renal cyst: CT mature protocol 12/2018 with 1.3 cm  subcapsular lesion in the left interpolar kidney most likely complicated cyst. CT A/P 08/2022 with lateral left renal cyst that is Bosniak category 1. No further follow-up is required.   #3. Prostate cancer screening: He is adopted and unknown if he has history of prostate cancer. PSA in 08/2022 was normal at 2.97. He denies bothersome lower urinary tract symptoms. He does have 1 time nocturia.     ALLERGIES: No Allergies    MEDICATIONS: Sildenafil Citrate 100 mg tablet 1 tablet PO Daily PRN     GU PSH: Bladder Instill AntiCA Agent - 2022, 2022, 2022, 2021, 2021, 2021, 2021, 2021, 2021, 2021, 2021, 2021, 2021, 2021, 2021, 2020 Cysto Fulgurate < 0.5 cm - 2021 Cystoscopy - 08/28/2021, 02/27/2021, 09/30/2020, 2022, 2021, 2021, 2021, 2020 Cystoscopy Insert Stent, Right - 2020 Cystoscopy TURBT <2 cm - 10/31/2020 Cystoscopy TURBT 2-5 cm - 2020 Locm 300-399Mg /Ml Iodine,1Ml - 09/07/2022, 2020     NON-GU PSH: Visit Complexity (formerly GPC1X) - 08/29/2022     GU PMH: Bladder Cancer overlapping sites - 09/07/2022, - 08/29/2022, History of high-grade nonmuscle invasive bladder cancer. Cystoscopy today negative. He has had initial hematuria once recently. Urinalysis today was clear, - 08/28/2021, No evidence of recurrence today, - 02/27/2021, Small recurrence resected earlier this month, he is doing well, - 11/21/2020, He has a persistent, slightly enlarging polypoid lesion in his bladder neck area that I think should be biopsied., - 09/30/2020, - 2022, - 2022, No evidence recurrence based on cystoscopy today., - 2022, cystoscopy not worrisome today., - 2021, - 2021, - 2021, He does have 2 small papillary recurrences today there were treated with cautery., - 2021, - 2021, History of high-grade non muscle invasive bladder cancer, status  post resection in November, 2020. No evidence recurrence cystoscopically today., - 2021 (Stable), He tolerated initial TURBT very well. Recommended that he is initiated on a 6 wk course of  BCG. We also discussed the potential for needing a repeat TURBT to screen for remaining disease and clean up if necessary. , - 2020 BPH w/o LUTS - 08/29/2022 Encounter for Prostate Cancer screening - 08/29/2022 Renal cyst - 08/29/2022 ED due to arterial insufficiency - 2022, He expresses interest in starting on sildenafil. , - 2020 Bladder tumor/neoplasm, Right, Bladder tumor present on right trigonal area -- likely low-grade and non-invasive but we will move forward with TURBT. - 2020 BPH w/LUTS, Prostate slightly enlarged though otherwise normal on exam. - 2020 Gross hematuria, Will evaluate w/ CT Bladder tumor present on cysto. - 2020 Nocturia - 2020    NON-GU PMH: No Non-GU PMH    FAMILY HISTORY: 2 sons - Son    Notes: adopted   SOCIAL HISTORY: Marital Status: Unknown Preferred Language: English; Ethnicity: Not Hispanic Or Latino; Race: White Current Smoking Status: Patient does not smoke anymore. Has not smoked since 12/25/1970. Smoked for 2 years. Smoked 1/2 pack per day.   Tobacco Use Assessment Completed: Used Tobacco in last 30 days? Does not use smokeless tobacco. Drinks 6 drinks per day. Types of alcohol consumed: Beer. Moderate Drinker.  Does not use drugs. Drinks 3 caffeinated drinks per day. Has not had a blood transfusion. 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    REVIEW OF SYSTEMS:    GU Review Male:   Patient denies frequent urination, hard to postpone urination, burning/ pain with urination, get up at night to urinate, leakage of urine, stream starts and stops, trouble starting your stream, have to strain to urinate , erection problems, and penile pain.  Gastrointestinal (Upper):   Patient denies nausea, vomiting, and indigestion/ heartburn.  Gastrointestinal (Lower):   Patient denies diarrhea and constipation.  Constitutional:   Patient denies fever, night sweats, weight loss, and fatigue.  Skin:   Patient denies skin rash/ lesion and itching.  Eyes:   Patient denies  blurred vision and double vision.  Ears/ Nose/ Throat:   Patient denies sore throat and sinus problems.  Hematologic/Lymphatic:   Patient denies swollen glands and easy bruising.  Cardiovascular:   Patient denies leg swelling and chest pains.  Respiratory:   Patient denies cough and shortness of breath.  Endocrine:   Patient denies excessive thirst.  Musculoskeletal:   Patient denies back pain and joint pain.  Neurological:   Patient denies headaches and dizziness.  Psychologic:   Patient denies depression and anxiety.   VITAL SIGNS: None   MULTI-SYSTEM PHYSICAL EXAMINATION:    Constitutional: Well-nourished. No physical deformities. Normally developed. Good grooming.  Respiratory: No labored breathing, no use of accessory muscles.   Cardiovascular: Normal temperature, normal extremity pulses, no swelling, no varicosities.  Gastrointestinal: No mass, no tenderness, no rigidity, non obese abdomen.     Complexity of Data:  Source Of History:  Patient, Medical Record Summary  Lab Test Review:   PSA  Records Review:   Previous Doctor Records, Previous Patient Records  Urine Test Review:   Urinalysis  X-Ray Review: C.T. Abdomen: Reviewed Films. Reviewed Report. Discussed With Patient.     08/29/22  PSA  Total PSA 2.97 ng/mL    PROCEDURES:         Flexible Cystoscopy - 52000  Risks, benefits, and some of the potential complications of the procedure were discussed at length with the  patient including infection, bleeding, voiding discomfort, urinary retention, fever, chills, sepsis, and others. All questions were answered. Informed consent was obtained. Antibiotic prophylaxis was given. Sterile technique and intraurethral analgesia were used.  Meatus:  Normal size. Normal location. Normal condition.  Urethra:  No strictures.  External Sphincter:  Normal.  Verumontanum:  Normal.  Prostate:  Non-obstructing. No hyperplasia.  Bladder Neck:  Non-obstructing.  Ureteral Orifices:  Normal  location. Normal size. Normal shape. Effluxed clear urine.  Bladder:  No trabeculation. Tumor protruding from the right ureteral orifice with mass effect from the distal right ureter. Otherwise, bladder mucosa without papillary lesions.      The lower urinary tract was carefully examined. The procedure was well-tolerated and without complications. Antibiotic instructions were given. Instructions were given to call the office immediately for bloody urine, difficulty urinating, urinary retention, painful or frequent urination, fever, chills, nausea, vomiting or other illness. The patient stated that he understood these instructions and would comply with them.         Urinalysis w/Scope Dipstick Dipstick Cont'd Micro  Color: Amber Bilirubin: Neg mg/dL WBC/hpf: 0 - 5/hpf  Appearance: Cloudy Ketones: Neg mg/dL RBC/hpf: >16/XWR  Specific Gravity: 1.010 Blood: 3+ ery/uL Bacteria: NS (Not Seen)  pH: 7.5 Protein: Trace mg/dL Cystals: NS (Not Seen)  Glucose: Neg mg/dL Urobilinogen: 0.2 mg/dL Casts: NS (Not Seen)    Nitrites: Neg Trichomonas: Not Present    Leukocyte Esterase: Neg leu/uL Mucous: Not Present      Epithelial Cells: NS (Not Seen)      Yeast: NS (Not Seen)      Sperm: Not Present    ASSESSMENT:      ICD-10 Details  1 GU:   Bladder Cancer overlapping sites - C67.8   2   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 High risk nonmuscle invasive bladder cancer:  -Cystoscopy today with no evidence of disease  -Will plan for repeat cystoscopy in approximately 6 months.   #2. 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 Surgery letter submitted for cystoscopy, right ureteroscopy with biopsy, right retrograde pyelogram, possible right stent placement. We  discussed we will follow pathology after surgery and if evidence of upper tract urothelial carcinoma, would plan for robotic assisted laparoscopic right nephro ureterectomy.   #3. Simple left renal cyst: We discussed this Bosniak category 3, no further follow-up is required.   #4. Prostate cancer screening: He is up-to-date with prostate cancer screening. Recommend PSA annually.   Urology Preoperative H&P   Chief Complaint: Right ureteral tumor  History of Present Illness: WIN RAYMUNDO is a 67 y.o. male with a right ureteral tumor here for cystoscopy, right ureteroscopy with biopsy, right retrograde pyelogram, possible right stent placement, possible TURBT. Denies fevers, chills or dysuria.  Past Medical History:  Diagnosis Date   Adopted    per pt unknown family medical history   Allergic rhinitis    Anxiety    Bladder cancer [facility])    urologist-- dr Retta Diones---  TURBT 11/ 2020   Chronic sinusitis    GERD (gastroesophageal reflux disease)    pt denies   History of  2019 novel coronavirus disease (COVID-19) 07/09/2019   positive result in epic, per pt no symptoms   History of bronchoscopy 11/24/2019   per pt this was done due to abnormal chest CT, sob, and hx bronchospasm 2020;  normal bronchoscopy with no lesion and no etiology of bronchospasms   History of Helicobacter pylori infection 11/2019   treated   Hypertension    Seasonal allergies     Past Surgical History:  Procedure Laterality Date   COLONOSCOPY WITH ESOPHAGOGASTRODUODENOSCOPY (EGD)  12/16/2019   CYSTOSCOPY WITH BIOPSY N/A 10/31/2020   Procedure: CYSTOSCOPY WITH TRANSURETRAL RESECTION OF  OF BLADDER NECK LESION;  Surgeon: Marcine Matar, MD;  Location[facility];  Service: Urology;  Laterality: N/A;   SEPTOPLASTY WITH ETHMOIDECTOMY, AND MAXILLARY ANTROSTOMY  09/13/2020   TRANSURETHRAL RESECTION OF BLADDER TUMOR WITH MITOMYCIN-C N/A 01/26/2019   Procedure: TRANSURETHRAL RESECTION OF BLADDER  TUMOR WITH MITOMYCIN-C;  Surgeon: Marcine Matar, MD;  Location: [facility];  Service: Urology;  Laterality: N/A;   VIDEO BRONCHOSCOPY N/A 11/24/2019   Procedure: VIDEO BRONCHOSCOPY WITHOUT FLUORO;  Surgeon: Tomma Lightning, MD;  Location: [facility];  Service: Pulmonary;  Laterality: N/A;    Allergies: No Known Allergies  Family History  Adopted: Yes  Problem Relation Age of Onset   Colon cancer Neg Hx    Esophageal cancer Neg Hx    Rectal cancer Neg Hx    Stomach cancer Neg Hx    Colon polyps Neg Hx     Social History:  reports that he quit smoking about 50 years ago. His smoking use included cigarettes. He started smoking about 52 years ago. He has never used smokeless tobacco. He reports current alcohol use of about 42.0 standard drinks of alcohol per week. He reports that he does not use drugs.  ROS: A complete review of systems was performed.  All systems are negative except for pertinent findings as noted.  Physical Exam:  Vital signs in last 24 hours:   Constitutional:  Alert and oriented, No acute distress Cardiovascular: Regular rate and rhythm Respiratory: Normal respiratory effort, Lungs clear bilaterally GI: Abdomen is soft, nontender, nondistended, no abdominal masses GU: No CVA tenderness Lymphatic: No lymphadenopathy Neurologic: Grossly intact, no focal deficits Psychiatric: Normal mood and affect  Laboratory Data:  No results for input(s): "WBC", "HGB", "HCT", "PLT" in the last 72 hours.  No results for input(s): "NA", "K", "CL", "GLUCOSE", "BUN", "CALCIUM", "CREATININE" in the last 72 hours.  Invalid input(s): "CO3"   No results found for this or any previous visit (from the past 24 hour(s)). No results found for this or any previous visit (from the past 240 hour(s)).  Renal Function: No results for input(s): "CREATININE" in the last 168 hours. CrCl cannot be calculated (Patient's most recent lab result is older than the  maximum 21 days allowed.).  Radiologic Imaging: No results found.  I independently reviewed the above imaging studies.  Assessment and Plan JEMIL STGERMAIN is a 67 y.o. male with a right ureteral tumor here for cystoscopy, right ureteroscopy with biopsy, right retrograde pyelogram, possible right stent placement, possible TURBT.    -The risks, benefits and alternatives were discussed with the patient.  Risks include, but are not limited to: bleeding, urinary tract infection, ureteral injury, ureteral stricture disease, chronic pain, urinary symptoms, bladder injury, stent migration, the need for nephrostomy tube placement, MI, CVA, DVT, PE and the inherent risks with general anesthesia.  The patient voices understanding and wishes to  proceed.   Matt R. Veto Macqueen MD 10/24/2022, 10:51 AM  [facility] Pager: [phone]): [phone]

## 2022-10-24 NOTE — Anesthesia Procedure Notes (Signed)
Procedure Name: LMA Insertion Date/Time: 10/24/2022 1:15 PM  Performed by: Florene Route, CRNAPatient Re-evaluated:Patient Re-evaluated prior to induction Oxygen Delivery Method: Circle system utilized Preoxygenation: Pre-oxygenation with 100% oxygen Induction Type: IV induction LMA Size: 5.0 Number of attempts: 1 Placement Confirmation: positive ETCO2 and breath sounds checked- equal and bilateral Tube secured with: Tape Dental Injury: Teeth and Oropharynx as per pre-operative assessment

## 2022-10-25 ENCOUNTER — Encounter (HOSPITAL_COMMUNITY): Payer: Self-pay | Admitting: Urology

## 2022-10-25 NOTE — Anesthesia Postprocedure Evaluation (Signed)
Anesthesia Post Note  Patient: Billy Hernandez  Procedure(s) Performed: CYSTOSCOPY/RIGHT URETEROSCOPY WITH  BIOPSY/RIGHT RETROGRADE PYELOGRAM/RIGHT STENT PLACEMENT (Right) TRANSURETHRAL RESECTION OF BLADDER TUMOR (TURBT)     Patient location during evaluation: PACU Anesthesia Type: General Level of consciousness: awake and alert Pain management: pain level controlled Vital Signs Assessment: post-procedure vital signs reviewed and stable Respiratory status: spontaneous breathing, nonlabored ventilation, respiratory function stable and patient connected to nasal cannula oxygen Cardiovascular status: blood pressure returned to baseline and stable Postop Assessment: no apparent nausea or vomiting Anesthetic complications: no   No notable events documented.  Last Vitals:  Vitals:   10/24/22 1445 10/24/22 1457  BP: (!) 169/77 (!) 185/94  Pulse: (!) 55 61  Resp: 16 14  Temp:  36.8 C  SpO2: 98% 93%    Last Pain:  Vitals:   10/24/22 1457  TempSrc: Oral  PainSc:                  Nelle Don Marquesha Robideau

## 2022-10-29 ENCOUNTER — Other Ambulatory Visit (HOSPITAL_COMMUNITY): Payer: Self-pay

## 2022-10-29 ENCOUNTER — Other Ambulatory Visit (HOSPITAL_COMMUNITY): Payer: Self-pay | Admitting: *Deleted

## 2022-10-29 NOTE — Progress Notes (Signed)
COVID Vaccine received:  []  No [x]  Yes Date of any COVID positive Test in last 90 days: no PCP - [facility] Cardiologist - no  Chest x-ray -  EKG -  10/03/22 Epic Stress Test -  ECHO -  Cardiac Cath -   Bowel Prep - [x]  No  []   Yes ______  Pacemaker / ICD device [x]  No []  Yes   Spinal Cord Stimulator:[x]  No []  Yes       History of Sleep Apnea? [x]  No []  Yes   CPAP used?- [x]  No []  Yes    Does the patient monitor blood sugar?          [x]  No []  Yes  []  N/A  Patient has: [x]  NO Hx DM   []  Pre-DM                 []  DM1  []   DM2 Does patient have a [employer] or Dexacom? []  No []  Yes   Fasting Blood Sugar Ranges-  Checks Blood Sugar _____ times a day  GLP1 agonist / usual dose - no GLP1 instructions:  SGLT-2 inhibitors / usual dose - no SGLT-2 instructions:   Blood Thinner / Instructions:no Aspirin Instructions:no  Comments:   Activity level: Patient is able  to climb a flight of stairs without difficulty; [x]  No CP  [x]  No SOB_   Patient can  perform ADLs without assistance.   Anesthesia review:   Patient denies shortness of breath, fever, cough and chest pain at PAT appointment.  Patient verbalized understanding and agreement to the Pre-Surgical Instructions that were given to them at this PAT appointment. Patient was also educated of the need to review these PAT instructions again prior to his/her surgery.I reviewed the appropriate phone numbers to call if they have any and questions or concerns.

## 2022-11-01 ENCOUNTER — Encounter (HOSPITAL_COMMUNITY): Payer: Self-pay | Admitting: Urology

## 2022-11-01 ENCOUNTER — Encounter (HOSPITAL_COMMUNITY)
Admission: RE | Admit: 2022-11-01 | Discharge: 2022-11-01 | Disposition: A | Discharge status: Home / Self Care | Payer: Medicare HMO | Source: Ambulatory Visit | Attending: Urology | Admitting: Urology

## 2022-11-01 ENCOUNTER — Other Ambulatory Visit (HOSPITAL_COMMUNITY): Payer: Self-pay | Admitting: *Deleted

## 2022-11-01 ENCOUNTER — Other Ambulatory Visit: Payer: Self-pay

## 2022-11-01 NOTE — Patient Instructions (Signed)
SURGICAL WAITING ROOM VISITATION  Patients having surgery or a procedure may have no more than 2 support people in the waiting area - these visitors may rotate.    Children under the age of 62 must have an adult with them who is not the patient.  Due to an increase in RSV and influenza rates and associated hospitalizations, children ages 77 and under may not visit patients in [facility] hospitals.  If the patient needs to stay at the hospital during part of their recovery, the visitor guidelines for inpatient rooms apply. Pre-op nurse will coordinate an appropriate time for 1 support person to accompany patient in pre-op.  This support person may not rotate.    Please refer to the [facility] website for the visitor guidelines for Inpatients (after your surgery is over and you are in a regular room).       Your procedure is scheduled on: 11/12/22   Report to [facility] Main Entrance    Report to admitting at 9:45 AM   Call this number if you have problems the morning of surgery [phone]   Do not eat food  or drink liquids :After Midnight.        Oral Hygiene is also important to reduce your risk of infection.                                    Remember - BRUSH YOUR TEETH THE MORNING OF SURGERY WITH YOUR REGULAR TOOTHPASTE    Stop all vitamins and herbal supplements 7 days before surgery.   Take these medicines the morning of surgery with A SIP OF WATER:  Buspar             You may not have any metal on your body including hair pins, jewelry, and body piercing             Do not wear make-up, lotions, powders, perfumes/cologne, or deodorant              Men may shave face and neck.   Do not bring valuables to the hospital. [place] IS NOT             RESPONSIBLE   FOR VALUABLES.   Contacts, glasses, dentures or bridgework may not be worn into surgery.   Bring small overnight bag day of surgery.   DO NOT BRING YOUR HOME MEDICATIONS TO THE  HOSPITAL. PHARMACY WILL DISPENSE MEDICATIONS LISTED ON YOUR MEDICATION LIST TO YOU DURING YOUR ADMISSION IN THE HOSPITAL!    Special Instructions: Bring a copy of your healthcare power of attorney and living will documents the day of surgery if you haven't scanned them before.              Please read over the following fact sheets you were given: IF YOU HAVE QUESTIONS ABOUT YOUR PRE-OP INSTRUCTIONS PLEASE CALL [phone] Rosey Bath   If you received a COVID test during your pre-op visit  it is requested that you wear a mask when out in public, stay away from anyone that may not be feeling well and notify your surgeon if you develop symptoms. If you test positive for Covid or have been in contact with anyone that has tested positive in the last 10 days please notify you surgeon.     - Preparing for Surgery Before surgery, you can play an important role.  Because skin is not  sterile, your skin needs to be as free of germs as possible.  You can reduce the number of germs on your skin by washing with CHG (chlorahexidine gluconate) soap before surgery.  CHG is an antiseptic cleaner which kills germs and bonds with the skin to continue killing germs even after washing. Please DO NOT use if you have an allergy to CHG or antibacterial soaps.  If your skin becomes reddened/irritated stop using the CHG and inform your nurse when you arrive at Short Stay. Do not shave (including legs and underarms) for at least 48 hours prior to the first CHG shower.  You may shave your face/neck.  Please follow these instructions carefully:  1.  Shower with CHG Soap the night before surgery and the  morning of surgery.  2.  If you choose to wash your hair, wash your hair first as usual with your normal  shampoo.  3.  After you shampoo, rinse your hair and body thoroughly to remove the shampoo.                             4.  Use CHG as you would any other liquid soap.  You can apply chg directly to the skin and  wash.  Gently with a scrungie or clean washcloth.  5.  Apply the CHG Soap to your body ONLY FROM THE NECK DOWN.   Do   not use on face/ open                           Wound or open sores. Avoid contact with eyes, ears mouth and   genitals (private parts).                       Wash face,  Genitals (private parts) with your normal soap.             6.  Wash thoroughly, paying special attention to the area where your    surgery  will be performed.  7.  Thoroughly rinse your body with warm water from the neck down.  8.  DO NOT shower/wash with your normal soap after using and rinsing off the CHG Soap.                9.  Pat yourself dry with a clean towel.            10.  Wear clean pajamas.            11.  Place clean sheets on your bed the night of your first shower and do not  sleep with pets. Day of Surgery : Do not apply any lotions/deodorants the morning of surgery.  Please wear clean clothes to the hospital/surgery center.  FAILURE TO FOLLOW THESE INSTRUCTIONS MAY RESULT IN THE CANCELLATION OF YOUR SURGERY  PATIENT SIGNATURE_________________________________  NURSE SIGNATURE__________________________________  ________________________________________________________________________

## 2022-11-12 ENCOUNTER — Inpatient Hospital Stay (HOSPITAL_COMMUNITY)
Admission: RE | Admit: 2022-11-12 | Discharge: 2022-11-13 | DRG: 658 | Disposition: A | Discharge status: Home / Self Care | Payer: Medicare HMO | Attending: Urology | Admitting: Urology

## 2022-11-12 ENCOUNTER — Other Ambulatory Visit: Payer: Self-pay

## 2022-11-12 ENCOUNTER — Inpatient Hospital Stay (HOSPITAL_COMMUNITY): Payer: Medicare HMO

## 2022-11-12 ENCOUNTER — Encounter (HOSPITAL_COMMUNITY): Payer: Self-pay | Admitting: Urology

## 2022-11-12 ENCOUNTER — Encounter (HOSPITAL_COMMUNITY)
Admission: RE | Disposition: A | Discharge status: Home / Self Care | Payer: Self-pay | Source: Home / Self Care | Attending: Urology

## 2022-11-12 DIAGNOSIS — C678 Malignant neoplasm of overlapping sites of bladder: Secondary | ICD-10-CM | POA: Diagnosis present

## 2022-11-12 DIAGNOSIS — K219 Gastro-esophageal reflux disease without esophagitis: Secondary | ICD-10-CM | POA: Diagnosis present

## 2022-11-12 DIAGNOSIS — D4959 Neoplasm of unspecified behavior of other genitourinary organ: Secondary | ICD-10-CM

## 2022-11-12 DIAGNOSIS — I10 Essential (primary) hypertension: Secondary | ICD-10-CM | POA: Diagnosis present

## 2022-11-12 DIAGNOSIS — Z87891 Personal history of nicotine dependence: Secondary | ICD-10-CM

## 2022-11-12 DIAGNOSIS — Z01818 Encounter for other preprocedural examination: Secondary | ICD-10-CM

## 2022-11-12 DIAGNOSIS — Z8616 Personal history of COVID-19: Secondary | ICD-10-CM

## 2022-11-12 DIAGNOSIS — C689 Malignant neoplasm of urinary organ, unspecified: Principal | ICD-10-CM | POA: Diagnosis present

## 2022-11-12 DIAGNOSIS — C676 Malignant neoplasm of ureteric orifice: Secondary | ICD-10-CM | POA: Diagnosis present

## 2022-11-12 DIAGNOSIS — D0919 Carcinoma in situ of other urinary organs: Secondary | ICD-10-CM | POA: Diagnosis present

## 2022-11-12 DIAGNOSIS — N261 Atrophy of kidney (terminal): Secondary | ICD-10-CM | POA: Diagnosis present

## 2022-11-12 DIAGNOSIS — N281 Cyst of kidney, acquired: Secondary | ICD-10-CM | POA: Diagnosis present

## 2022-11-12 DIAGNOSIS — Z08 Encounter for follow-up examination after completed treatment for malignant neoplasm: Secondary | ICD-10-CM | POA: Diagnosis present

## 2022-11-12 HISTORY — PX: ROBOT ASSITED LAPAROSCOPIC NEPHROURETERECTOMY: SHX6077

## 2022-11-12 LAB — CBC WITH DIFFERENTIAL/PLATELET
Abs Immature Granulocytes: 0.04 10*3/uL (ref 0.00–0.07)
Basophils Absolute: 0 10*3/uL (ref 0.0–0.1)
Basophils Relative: 0 %
Eosinophils Absolute: 0 10*3/uL (ref 0.0–0.5)
Eosinophils Relative: 0 %
HCT: 38.5 % — ABNORMAL LOW (ref 39.0–52.0)
Hemoglobin: 13.2 g/dL (ref 13.0–17.0)
Immature Granulocytes: 0 %
Lymphocytes Relative: 7 %
Lymphs Abs: 0.7 10*3/uL (ref 0.7–4.0)
MCH: 33.3 pg (ref 26.0–34.0)
MCHC: 34.3 g/dL (ref 30.0–36.0)
MCV: 97.2 fL (ref 80.0–100.0)
Monocytes Absolute: 0.2 10*3/uL (ref 0.1–1.0)
Monocytes Relative: 2 %
Neutro Abs: 9.6 10*3/uL — ABNORMAL HIGH (ref 1.7–7.7)
Neutrophils Relative %: 91 %
Platelets: 281 10*3/uL (ref 150–400)
RBC: 3.96 MIL/uL — ABNORMAL LOW (ref 4.22–5.81)
RDW: 12.1 % (ref 11.5–15.5)
WBC: 10.6 10*3/uL — ABNORMAL HIGH (ref 4.0–10.5)
nRBC: 0 % (ref 0.0–0.2)

## 2022-11-12 LAB — CBC
HCT: 34.6 % — ABNORMAL LOW (ref 39.0–52.0)
Hemoglobin: 12.2 g/dL — ABNORMAL LOW (ref 13.0–17.0)
MCH: 32.8 pg (ref 26.0–34.0)
MCHC: 35.3 g/dL (ref 30.0–36.0)
MCV: 93 fL (ref 80.0–100.0)
Platelets: 275 10*3/uL (ref 150–400)
RBC: 3.72 MIL/uL — ABNORMAL LOW (ref 4.22–5.81)
RDW: 11.9 % (ref 11.5–15.5)
WBC: 3.3 10*3/uL — ABNORMAL LOW (ref 4.0–10.5)
nRBC: 0 % (ref 0.0–0.2)

## 2022-11-12 LAB — TYPE AND SCREEN
ABO/RH(D): O POS
Antibody Screen: NEGATIVE

## 2022-11-12 LAB — ABO/RH: ABO/RH(D): O POS

## 2022-11-12 LAB — HIV ANTIBODY (ROUTINE TESTING W REFLEX): HIV Screen 4th Generation wRfx: NONREACTIVE

## 2022-11-12 LAB — BASIC METABOLIC PANEL
Anion gap: 15 (ref 5–15)
BUN: 11 mg/dL (ref 8–23)
CO2: 20 mmol/L — ABNORMAL LOW (ref 22–32)
Calcium: 9 mg/dL (ref 8.9–10.3)
Chloride: 93 mmol/L — ABNORMAL LOW (ref 98–111)
Creatinine, Ser: 1.1 mg/dL (ref 0.61–1.24)
GFR, Estimated: >60 mL/min (ref >60)
Glucose, Bld: 179 mg/dL — ABNORMAL HIGH (ref 70–99)
Potassium: 4.5 mmol/L (ref 3.5–5.1)
Sodium: 128 mmol/L — ABNORMAL LOW (ref 135–145)

## 2022-11-12 SURGERY — NEPHROURETERECTOMY, ROBOT-ASSISTED, LAPAROSCOPIC
Anesthesia: General | Laterality: Right

## 2022-11-12 MED ORDER — ORAL CARE MOUTH RINSE: 15.0000 mL | Freq: Once | OROMUCOSAL | Status: AC

## 2022-11-12 MED ORDER — GLYCOPYRROLATE 0.2 MG/ML IJ SOLN
INTRAMUSCULAR | Status: DC | PRN
  Administered 2022-11-12: .1 mg via INTRAVENOUS

## 2022-11-12 MED ORDER — ORAL CARE MOUTH RINSE: 15.0000 mL | Freq: Once | OROMUCOSAL | Status: DC

## 2022-11-12 MED ORDER — OXYCODONE-ACETAMINOPHEN 5-325 MG PO TABS
1.0000 | ORAL_TABLET | ORAL | Status: DC | PRN
  Administered 2022-11-13 (×3): 2 via ORAL
  Filled 2022-11-12 (×2): qty 2
  Filled 2022-11-12: qty 1
  Filled 2022-11-12: qty 2

## 2022-11-12 MED ORDER — BUSPIRONE HCL 10 MG PO TABS
10.0000 mg | ORAL_TABLET | Freq: Two times a day (BID) | ORAL | Status: DC
  Administered 2022-11-12 – 2022-11-13 (×2): 10 mg via ORAL
  Filled 2022-11-12 (×2): qty 1

## 2022-11-12 MED ORDER — HYDROMORPHONE HCL 2 MG/ML IJ SOLN
INTRAMUSCULAR | Status: AC
  Filled 2022-11-12: qty 1

## 2022-11-12 MED ORDER — FENTANYL CITRATE (PF) 100 MCG/2ML IJ SOLN
INTRAMUSCULAR | Status: DC | PRN
  Administered 2022-11-12 (×2): 50 ug via INTRAVENOUS
  Administered 2022-11-12: 100 ug via INTRAVENOUS
  Administered 2022-11-12: 50 ug via INTRAVENOUS

## 2022-11-12 MED ORDER — MIDAZOLAM HCL 5 MG/5ML IJ SOLN
INTRAMUSCULAR | Status: DC | PRN
  Administered 2022-11-12: 2 mg via INTRAVENOUS

## 2022-11-12 MED ORDER — LACTATED RINGERS IV SOLN: INTRAVENOUS | Status: DC

## 2022-11-12 MED ORDER — MIDAZOLAM HCL 2 MG/2ML IJ SOLN
INTRAMUSCULAR | Status: AC
  Filled 2022-11-12: qty 2

## 2022-11-12 MED ORDER — HYDROMORPHONE HCL 1 MG/ML IJ SOLN
0.2500 mg | INTRAMUSCULAR | Status: DC | PRN
  Administered 2022-11-12: 0.5 mg via INTRAVENOUS

## 2022-11-12 MED ORDER — PROPOFOL 10 MG/ML IV BOLUS
INTRAVENOUS | Status: DC | PRN
  Administered 2022-11-12: 160 mg via INTRAVENOUS

## 2022-11-12 MED ORDER — LISINOPRIL 10 MG PO TABS
10.0000 mg | ORAL_TABLET | Freq: Every day | ORAL | Status: DC
  Administered 2022-11-12 – 2022-11-13 (×2): 10 mg via ORAL
  Filled 2022-11-12 (×2): qty 1

## 2022-11-12 MED ORDER — POLYETHYLENE GLYCOL 3350 17 G PO PACK: 17.0000 g | PACK | Freq: Every day | ORAL | Status: DC | PRN

## 2022-11-12 MED ORDER — DOCUSATE SODIUM 100 MG PO CAPS: 100.0000 mg | ORAL_CAPSULE | Freq: Every day | ORAL | 0 refills | Status: DC | PRN

## 2022-11-12 MED ORDER — SODIUM CHLORIDE (PF) 0.9 % IJ SOLN
INTRAMUSCULAR | Status: AC
  Filled 2022-11-12: qty 20

## 2022-11-12 MED ORDER — LACTATED RINGERS IR SOLN
Status: DC | PRN
  Administered 2022-11-12: 1000 mL

## 2022-11-12 MED ORDER — BUPIVACAINE LIPOSOME 1.3 % IJ SUSP
INTRAMUSCULAR | Status: DC | PRN
  Administered 2022-11-12: 20 mL

## 2022-11-12 MED ORDER — ALBUMIN HUMAN 5 % IV SOLN
INTRAVENOUS | Status: AC
  Filled 2022-11-12: qty 250

## 2022-11-12 MED ORDER — KCL IN DEXTROSE-NACL 20-5-0.45 MEQ/L-%-% IV SOLN
INTRAVENOUS | Status: DC
  Filled 2022-11-12 (×2): qty 1000

## 2022-11-12 MED ORDER — PROPOFOL 10 MG/ML IV BOLUS
INTRAVENOUS | Status: AC
  Filled 2022-11-12: qty 20

## 2022-11-12 MED ORDER — CEFAZOLIN SODIUM-DEXTROSE 2-4 GM/100ML-% IV SOLN
2.0000 g | INTRAVENOUS | Status: AC
  Administered 2022-11-12 (×2): 2 g via INTRAVENOUS
  Filled 2022-11-12: qty 100

## 2022-11-12 MED ORDER — ACETAMINOPHEN 10 MG/ML IV SOLN
1000.0000 mg | Freq: Once | INTRAVENOUS | Status: DC | PRN
  Administered 2022-11-12: 1000 mg via INTRAVENOUS

## 2022-11-12 MED ORDER — OXYCODONE-ACETAMINOPHEN 5-325 MG PO TABS
1.0000 | ORAL_TABLET | ORAL | 0 refills | Status: DC | PRN
Start: 2022-11-12 — End: 2022-12-13

## 2022-11-12 MED ORDER — DROPERIDOL 2.5 MG/ML IJ SOLN: 0.6250 mg | Freq: Once | INTRAMUSCULAR | Status: DC | PRN

## 2022-11-12 MED ORDER — CEFAZOLIN SODIUM-DEXTROSE 2-4 GM/100ML-% IV SOLN
INTRAVENOUS | Status: AC
  Filled 2022-11-12: qty 100

## 2022-11-12 MED ORDER — STERILE WATER FOR IRRIGATION IR SOLN
Status: DC | PRN
  Administered 2022-11-12: 1000 mL

## 2022-11-12 MED ORDER — HYDROMORPHONE HCL 1 MG/ML IJ SOLN
INTRAMUSCULAR | Status: DC | PRN
  Administered 2022-11-12: .5 mg via INTRAVENOUS
  Administered 2022-11-12: 1 mg via INTRAVENOUS
  Administered 2022-11-12: .5 mg via INTRAVENOUS

## 2022-11-12 MED ORDER — ACETAMINOPHEN 160 MG/5ML PO SOLN: 325.0000 mg | Freq: Once | ORAL | Status: DC | PRN

## 2022-11-12 MED ORDER — ZOLPIDEM TARTRATE 5 MG PO TABS: 5.0000 mg | ORAL_TABLET | Freq: Every evening | ORAL | Status: DC | PRN

## 2022-11-12 MED ORDER — ONDANSETRON HCL 4 MG/2ML IJ SOLN
INTRAMUSCULAR | Status: AC
  Filled 2022-11-12: qty 2

## 2022-11-12 MED ORDER — LIDOCAINE HCL (CARDIAC) PF 100 MG/5ML IV SOSY
PREFILLED_SYRINGE | INTRAVENOUS | Status: DC | PRN
  Administered 2022-11-12: 40 mg via INTRAVENOUS

## 2022-11-12 MED ORDER — HYDROMORPHONE HCL 1 MG/ML IJ SOLN
0.5000 mg | INTRAMUSCULAR | Status: DC | PRN
  Administered 2022-11-12 – 2022-11-13 (×3): 1 mg via INTRAVENOUS
  Filled 2022-11-12 (×3): qty 1

## 2022-11-12 MED ORDER — SODIUM CHLORIDE (PF) 0.9 % IJ SOLN
INTRAMUSCULAR | Status: DC | PRN
  Administered 2022-11-12: 20 mL

## 2022-11-12 MED ORDER — DIPHENHYDRAMINE HCL 12.5 MG/5ML PO ELIX: 12.5000 mg | ORAL_SOLUTION | Freq: Four times a day (QID) | ORAL | Status: DC | PRN

## 2022-11-12 MED ORDER — ACETAMINOPHEN 325 MG PO TABS: 325.0000 mg | ORAL_TABLET | Freq: Once | ORAL | Status: DC | PRN

## 2022-11-12 MED ORDER — OXYBUTYNIN CHLORIDE 5 MG PO TABS
5.0000 mg | ORAL_TABLET | Freq: Three times a day (TID) | ORAL | Status: DC | PRN
  Administered 2022-11-13 (×2): 5 mg via ORAL
  Filled 2022-11-12 (×2): qty 1

## 2022-11-12 MED ORDER — ONDANSETRON HCL 4 MG/2ML IJ SOLN: 4.0000 mg | INTRAMUSCULAR | Status: DC | PRN

## 2022-11-12 MED ORDER — ROCURONIUM BROMIDE 10 MG/ML (PF) SYRINGE
PREFILLED_SYRINGE | INTRAVENOUS | Status: AC
  Filled 2022-11-12: qty 10

## 2022-11-12 MED ORDER — DIPHENHYDRAMINE HCL 50 MG/ML IJ SOLN: 12.5000 mg | Freq: Four times a day (QID) | INTRAMUSCULAR | Status: DC | PRN

## 2022-11-12 MED ORDER — SODIUM CHLORIDE 0.9 % IV SOLN: INTRAVENOUS | Status: DC | PRN

## 2022-11-12 MED ORDER — DIPHENHYDRAMINE HCL 50 MG/ML IJ SOLN
INTRAMUSCULAR | Status: DC | PRN
  Administered 2022-11-12: 25 mg via INTRAVENOUS

## 2022-11-12 MED ORDER — ACETAMINOPHEN 325 MG PO TABS: 650.0000 mg | ORAL_TABLET | ORAL | Status: DC | PRN

## 2022-11-12 MED ORDER — LIDOCAINE HCL (PF) 2 % IJ SOLN
INTRAMUSCULAR | Status: AC
  Filled 2022-11-12: qty 5

## 2022-11-12 MED ORDER — BUPIVACAINE LIPOSOME 1.3 % IJ SUSP
INTRAMUSCULAR | Status: AC
  Filled 2022-11-12: qty 20

## 2022-11-12 MED ORDER — GLYCOPYRROLATE 0.2 MG/ML IJ SOLN
INTRAMUSCULAR | Status: AC
  Filled 2022-11-12: qty 1

## 2022-11-12 MED ORDER — FENTANYL CITRATE (PF) 250 MCG/5ML IJ SOLN
INTRAMUSCULAR | Status: AC
  Filled 2022-11-12: qty 5

## 2022-11-12 MED ORDER — DEXAMETHASONE SODIUM PHOSPHATE 10 MG/ML IJ SOLN
INTRAMUSCULAR | Status: AC
  Filled 2022-11-12: qty 1

## 2022-11-12 MED ORDER — MEPERIDINE HCL 50 MG/ML IJ SOLN: 6.2500 mg | INTRAMUSCULAR | Status: DC | PRN

## 2022-11-12 MED ORDER — CHLORHEXIDINE GLUCONATE 0.12 % MT SOLN: 15.0000 mL | Freq: Once | OROMUCOSAL | Status: DC

## 2022-11-12 MED ORDER — EPHEDRINE SULFATE-NACL 50-0.9 MG/10ML-% IV SOSY
PREFILLED_SYRINGE | INTRAVENOUS | Status: DC | PRN
Start: 2022-11-12 — End: 2022-11-12
  Administered 2022-11-12: 10 mg via INTRAVENOUS

## 2022-11-12 MED ORDER — ONDANSETRON HCL 4 MG/2ML IJ SOLN
INTRAMUSCULAR | Status: DC | PRN
  Administered 2022-11-12 (×2): 4 mg via INTRAVENOUS

## 2022-11-12 MED ORDER — ACETAMINOPHEN 10 MG/ML IV SOLN
INTRAVENOUS | Status: AC
  Filled 2022-11-12: qty 100

## 2022-11-12 MED ORDER — ROCURONIUM BROMIDE 100 MG/10ML IV SOLN
INTRAVENOUS | Status: DC | PRN
  Administered 2022-11-12: 70 mg via INTRAVENOUS
  Administered 2022-11-12 (×2): 20 mg via INTRAVENOUS
  Administered 2022-11-12: 10 mg via INTRAVENOUS
  Administered 2022-11-12: 25 mg via INTRAVENOUS
  Administered 2022-11-12: 20 mg via INTRAVENOUS
  Administered 2022-11-12: 10 mg via INTRAVENOUS
  Administered 2022-11-12: 15 mg via INTRAVENOUS

## 2022-11-12 MED ORDER — HYDROMORPHONE HCL 1 MG/ML IJ SOLN
INTRAMUSCULAR | Status: AC
  Filled 2022-11-12: qty 1

## 2022-11-12 MED ORDER — EPHEDRINE 5 MG/ML INJ
INTRAVENOUS | Status: AC
  Filled 2022-11-12: qty 5

## 2022-11-12 MED ORDER — CHLORHEXIDINE GLUCONATE 0.12 % MT SOLN
15.0000 mL | Freq: Once | OROMUCOSAL | Status: AC
  Administered 2022-11-12: 15 mL via OROMUCOSAL

## 2022-11-12 MED ORDER — DIPHENHYDRAMINE HCL 50 MG/ML IJ SOLN
INTRAMUSCULAR | Status: AC
  Filled 2022-11-12: qty 1

## 2022-11-12 MED ORDER — ALBUMIN HUMAN 5 % IV SOLN: INTRAVENOUS | Status: DC | PRN

## 2022-11-12 MED ORDER — PROMETHAZINE HCL 25 MG/ML IJ SOLN: 6.2500 mg | INTRAMUSCULAR | Status: DC | PRN

## 2022-11-12 MED ORDER — SUGAMMADEX SODIUM 200 MG/2ML IV SOLN
INTRAVENOUS | Status: DC | PRN
  Administered 2022-11-12 (×2): 100 mg via INTRAVENOUS

## 2022-11-12 MED ORDER — TRAZODONE HCL 50 MG PO TABS
50.0000 mg | ORAL_TABLET | Freq: Every day | ORAL | Status: DC
  Administered 2022-11-12: 50 mg via ORAL
  Filled 2022-11-12: qty 1

## 2022-11-12 MED ORDER — DEXAMETHASONE SODIUM PHOSPHATE 10 MG/ML IJ SOLN
INTRAMUSCULAR | Status: DC | PRN
  Administered 2022-11-12: 10 mg via INTRAVENOUS

## 2022-11-12 SURGICAL SUPPLY — 69 items
ADH SKN CLS APL DERMABOND .7 (GAUZE/BANDAGES/DRESSINGS) ×1
APL PRP STRL LF DISP 70% ISPRP (MISCELLANEOUS) ×1
BAG COUNTER SPONGE SURGICOUNT (BAG) IMPLANT
BAG LAPAROSCOPIC 12 15 PORT 16 (BASKET) ×1 IMPLANT
BAG RETRIEVAL 12/15 (BASKET) ×1
BAG SPNG CNTER NS LX DISP (BAG)
CHLORAPREP W/TINT 26 (MISCELLANEOUS) ×1 IMPLANT
CLIP LIGATING HEM O LOK PURPLE (MISCELLANEOUS) ×1 IMPLANT
CLIP LIGATING HEMO LOK XL GOLD (MISCELLANEOUS) IMPLANT
CLIP LIGATING HEMO O LOK GREEN (MISCELLANEOUS) ×1 IMPLANT
COVER SURGICAL LIGHT HANDLE (MISCELLANEOUS) ×1 IMPLANT
COVER TIP SHEARS 8 DVNC (MISCELLANEOUS) ×1 IMPLANT
CUTTER ECHEON FLEX ENDO 45 340 (ENDOMECHANICALS) IMPLANT
DERMABOND ADVANCED .7 DNX12 (GAUZE/BANDAGES/DRESSINGS) ×2 IMPLANT
DRAIN CHANNEL 15F RND FF 3/16 (WOUND CARE) IMPLANT
DRAIN RELI 100 BL SUC LF ST (DRAIN) ×1
DRAPE ARM DVNC X/XI (DISPOSABLE) ×4 IMPLANT
DRAPE COLUMN DVNC XI (DISPOSABLE) ×1 IMPLANT
DRAPE INCISE IOBAN 66X45 STRL (DRAPES) ×1 IMPLANT
DRAPE SHEET LG 3/4 BI-LAMINATE (DRAPES) ×1 IMPLANT
DRIVER NDL LRG 8 DVNC XI (INSTRUMENTS) ×2 IMPLANT
DRIVER NDLE LRG 8 DVNC XI (INSTRUMENTS) ×2
DRSG TEGADERM 4X4.75 (GAUZE/BANDAGES/DRESSINGS) IMPLANT
ELECT REM PT RETURN 15FT ADLT (MISCELLANEOUS) ×1 IMPLANT
EVACUATOR SILICONE 100CC (DRAIN) IMPLANT
FORCEPS BPLR FENES DVNC XI (FORCEP) ×1 IMPLANT
FORCEPS PROGRASP DVNC XI (FORCEP) ×1 IMPLANT
GAUZE SPONGE 4X4 12PLY STRL (GAUZE/BANDAGES/DRESSINGS) IMPLANT
GLOVE BIO SURGEON STRL SZ 6.5 (GLOVE) ×1 IMPLANT
GLOVE SURG LX STRL 7.5 STRW (GLOVE) ×2 IMPLANT
GOWN STRL REUS W/ TWL XL LVL3 (GOWN DISPOSABLE) ×1 IMPLANT
GOWN STRL REUS W/TWL XL LVL3 (GOWN DISPOSABLE) ×3
IRRIG SUCT STRYKERFLOW 2 WTIP (MISCELLANEOUS) ×1
IRRIGATION SUCT STRKRFLW 2 WTP (MISCELLANEOUS) ×1 IMPLANT
KIT BASIN OR (CUSTOM PROCEDURE TRAY) ×1 IMPLANT
KIT TURNOVER KIT A (KITS) IMPLANT
MARKER SKIN DUAL TIP RULER LAB (MISCELLANEOUS) ×1 IMPLANT
NDL INSUFFLATION 14GA 120MM (NEEDLE) ×1 IMPLANT
NEEDLE INSUFFLATION 14GA 120MM (NEEDLE) ×1
NS IRRIG 1000ML POUR BTL (IV SOLUTION) ×1 IMPLANT
PENCIL SMOKE EVACUATOR (MISCELLANEOUS) IMPLANT
PLUG CATH AND CAP STRL 200 (CATHETERS) IMPLANT
PORT ACCESS TROCAR AIRSEAL 12 (TROCAR) ×1 IMPLANT
PROTECTOR NERVE ULNAR (MISCELLANEOUS) ×2 IMPLANT
RELOAD STAPLE 45 2.6 WHT THIN (STAPLE) IMPLANT
SCISSORS MNPLR CVD DVNC XI (INSTRUMENTS) ×1 IMPLANT
SEAL UNIV 5-12 XI (MISCELLANEOUS) ×3 IMPLANT
SET CYSTO W/LG BORE CLAMP LF (SET/KITS/TRAYS/PACK) IMPLANT
SLEEVE ADV FIXATION 12X100MM (TROCAR) ×1 IMPLANT
SOL ELECTROSURG ANTI STICK (MISCELLANEOUS) ×1
SOLUTION ELECTROSURG ANTI STCK (MISCELLANEOUS) ×1 IMPLANT
SPIKE FLUID TRANSFER (MISCELLANEOUS) ×1 IMPLANT
STAPLE RELOAD 45 WHT (STAPLE) ×2
STAPLER POWER ECHELON 45 WIDE (STAPLE) IMPLANT
SUT ETHILON 3 0 PS 1 (SUTURE) ×1 IMPLANT
SUT MNCRL AB 4-0 PS2 18 (SUTURE) ×2 IMPLANT
SUT PDS AB 1 CT1 27 (SUTURE) ×3 IMPLANT
SUT VIC AB 2-0 SH 27 (SUTURE) ×1
SUT VIC AB 2-0 SH 27X BRD (SUTURE) ×1 IMPLANT
SUT VLOC BARB 180 ABS3/0GR12 (SUTURE) ×2
SUTURE VLOC BRB 180 ABS3/0GR12 (SUTURE) ×1 IMPLANT
SYR BULB IRRIG 60ML STRL (SYRINGE) IMPLANT
TOWEL OR 17X26 10 PK STRL BLUE (TOWEL DISPOSABLE) ×1 IMPLANT
TOWEL OR NON WOVEN STRL DISP B (DISPOSABLE) ×1 IMPLANT
TRAY FOLEY MTR SLVR 16FR STAT (SET/KITS/TRAYS/PACK) ×1 IMPLANT
TRAY LAPAROSCOPIC (CUSTOM PROCEDURE TRAY) ×1 IMPLANT
TROCAR ADV FIXATION 12X100MM (TROCAR) ×1 IMPLANT
TROCAR Z-THREAD OPTICAL 5X100M (TROCAR) IMPLANT
WATER STERILE IRR 1000ML POUR (IV SOLUTION) ×1 IMPLANT

## 2022-11-12 NOTE — H&P (Signed)
Office Visit Report     10/31/2022   --------------------------------------------------------------------------------   Ingrid Yanik  MRN: [id number]  DOB: 01/09/1956, 67 year old Male  SSN:    PRIMARY CARE:  Ahunna N. Fredric Dine, MD  PRIMARY CARE FAX:  [phone]  REFERRING:  Jannifer Hick, MD  PROVIDER:  Jettie Pagan, M.D.  LOCATION:  [facility] 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 He was previously followed by Dr. Retta Diones.   1. High risk nonmuscle invasive bladder cancer:  Presented with gross hematuria. Only smoked as young child. No family history of urologic malignancy. CT hematuria protocol 12/2018 with 3.5 cm mass in the posterior right bladder.  -S/p TURBT 01/2019 with gemcitabine for 3.5cm right trigonal tumor. Path: HG T1 UCB (muscle present and uninvolved.  -Completed 6/6 BCG induction in 04/2019 and completed 3 maintenance BCG cycles.  -S/p TURBT 10/2020 of a small papillary lesion at the bladder neck/prostatic junction. Pathology: HG Ta UCB. Base of bladder resection revealed no involvement.  -Did not undergo BCG following TURBT in 10/2020  -Cystoscopy 09/25/2022 with tumor protruding from the right ureteral orifice with adjacent mass affect from the distal right ureter. No papillary lesions within the bladder itself.  -S/p TURBT 10/24/2022 with HG Ta UCB, subcentimeter adjacent to right ureter.   2. Right upper tract urothelial carcinoma:  -CT A/P 09/07/2022 with enhancing soft tissue in the distal right ureter extending to the UVJ suspicious for urothelial neoplasm. Also associated moderate to right severe hydroureteronephrosis with right renal cortical atrophy.  -Cystoscopy 09/25/2022 with tumor protruding from the right  ureteral orifice with adjacent mass affect from the distal right ureter.  -S/p right ureteral tumor biopsy with noninvasive high-grade urothelial carcinoma.   He has recovered well from biopsy. No longer having hematuria. NO fevers, chills, dysuria.   #2. Left renal cyst: CT mature protocol 12/2018 with 1.3 cm subcapsular lesion in the left interpolar kidney most likely complicated cyst. CT A/P 08/2022 with lateral left renal cyst that is Bosniak category 1. No further follow-up is required.   #3. Prostate cancer screening: He is adopted and unknown if he has history of prostate cancer. PSA in 08/2022 was normal at 2.97. He denies bothersome lower urinary tract symptoms. He does have 1 time nocturia.     ALLERGIES: No Allergies    MEDICATIONS: Sildenafil Citrate 100 mg tablet 1 tablet PO Daily PRN     GU PSH: Bladder Instill AntiCA Agent - 2022, 2022, 2022, 2021, 2021, 2021, 2021, 2021, 2021, 2021, 2021, 2021, 2021, 2021, 2021, 2020 Cysto Fulgurate < 0.5 cm - 2021 Cystoscopy - 09/25/2022, 08/28/2021, 02/27/2021, 2022, 2022, 2021, 2021, 2021, 2020 Cystoscopy Insert Stent, Right - 2020 Cystoscopy TURBT <2 cm - 2022 Cystoscopy TURBT 2-5 cm - 2020 Locm 300-399Mg /Ml Iodine,1Ml - 09/07/2022, 2020     NON-GU PSH: Visit Complexity (formerly GPC1X) - 08/29/2022     GU PMH: Bladder Cancer overlapping sites - 09/25/2022, - 09/07/2022, - 08/29/2022, History of high-grade nonmuscle invasive bladder cancer. Cystoscopy today negative. He has had initial hematuria once recently. Urinalysis today was clear, - 08/28/2021, No evidence of recurrence today, - 02/27/2021, Small recurrence resected earlier this month, he is doing well, - 11/21/2020, He has a persistent, slightly enlarging polypoid lesion in his bladder neck area that I think should be biopsied., -  2022, - 2022, - 2022, No evidence recurrence based on cystoscopy today., - 2022, cystoscopy not worrisome today., - 2021, - 2021, - 2021, He does have 2 small  papillary recurrences today there were treated with cautery., - 2021, - 2021, History of high-grade non muscle invasive bladder cancer, status post resection in November, 2020. No evidence recurrence cystoscopically today., - 2021 (Stable), He tolerated initial TURBT very well. Recommended that he is initiated on a 6 wk course of BCG. We also discussed the potential for needing a repeat TURBT to screen for remaining disease and clean up if necessary. 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 , - 2020 Bladder tumor/neoplasm, Right, Bladder tumor present on right trigonal area -- likely low-grade and non-invasive but we will move forward with TURBT. - 2020 BPH w/LUTS, Prostate slightly enlarged though otherwise normal on exam. - 2020 Gross hematuria, Will evaluate w/ CT Bladder tumor present on cysto. - 2020 Nocturia - 2020    NON-GU PMH: No Non-GU PMH    FAMILY HISTORY: 2 sons - Son    Notes: adopted   SOCIAL HISTORY: Marital Status: Unknown Preferred Language: English; Ethnicity: Not Hispanic Or Latino; Race: White Current Smoking Status: Patient does not smoke anymore. Has not smoked since 12/25/1970. Smoked for 2 years. Smoked 1/2 pack per day.   Tobacco Use Assessment Completed: Used Tobacco in last 30 days? Does not use smokeless tobacco. Drinks 6 drinks per day. Types of alcohol consumed: Beer. Moderate Drinker.  Does not use drugs. Drinks 3 caffeinated drinks per day. Has not had a blood transfusion. 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    REVIEW OF SYSTEMS:    GU Review Male:   Patient denies frequent urination, hard to postpone urination, burning/ pain with urination, get up at night to urinate, leakage of urine, stream starts and stops, trouble starting your stream, have to  strain to urinate , erection problems, and penile pain.  Gastrointestinal (Upper):   Patient denies nausea, vomiting, and indigestion/ heartburn.  Gastrointestinal (Lower):   Patient denies diarrhea and constipation.  Constitutional:   Patient denies fever, night sweats, weight loss, and fatigue.  Skin:   Patient denies skin rash/ lesion and itching.  Eyes:   Patient denies blurred vision and double vision.  Ears/ Nose/ Throat:   Patient denies sore throat and sinus problems.  Hematologic/Lymphatic:   Patient denies swollen glands and easy bruising.  Cardiovascular:   Patient denies leg swelling and chest pains.  Respiratory:   Patient denies cough and shortness of breath.  Endocrine:   Patient denies excessive thirst.  Musculoskeletal:   Patient denies back pain and joint pain.  Neurological:   Patient denies headaches and dizziness.  Psychologic:   Patient denies depression and anxiety.   VITAL SIGNS: None   MULTI-SYSTEM PHYSICAL EXAMINATION:    Constitutional: Well-nourished. No physical deformities. Normally developed. Good grooming.  Respiratory: No labored breathing, no use of accessory muscles.   Cardiovascular: Normal temperature, normal extremity pulses, no swelling, no varicosities.  Gastrointestinal: No mass, no tenderness, no rigidity, non obese abdomen.     Complexity of Data:  Source Of History:  Patient, Medical Record Summary  Records Review:   Previous Doctor Records, Previous Patient Records  Urine Test Review:   Urinalysis   08/29/22  PSA  Total  PSA 2.97 ng/mL    PROCEDURES:          Visit Complexity - G2211          Urinalysis w/Scope Dipstick Dipstick Cont'd Micro  Color: Yellow Bilirubin: Neg mg/dL WBC/hpf: 0 - 5/hpf  Appearance: Clear Ketones: Neg mg/dL RBC/hpf: 10 - 16/XWR  Specific Gravity: 1.010 Blood: 3+ ery/uL Bacteria: Rare (0-9/hpf)  pH: 7.5 Protein: Trace mg/dL Cystals: NS (Not Seen)  Glucose: Neg mg/dL Urobilinogen: 0.2 mg/dL Casts: NS (Not  Seen)    Nitrites: Neg Trichomonas: Not Present    Leukocyte Esterase: Trace leu/uL Mucous: Not Present      Epithelial Cells: 0 - 5/hpf      Yeast: NS (Not Seen)      Sperm: Not Present    ASSESSMENT:      ICD-10 Details  1 GU:   Bladder Cancer overlapping sites - C67.8   2   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 High risk nonmuscle invasive bladder cancer:  -I reviewed recent TURBT with evidence of high-grade Ta UCB.  -Given concomitant upper tract right urothelial carcinoma, we will proceed with nephroureterectomy as above in follow-up, we will proceed with cystoscopy and potential reresection with repeat BCG induction.   #2. Right upper tract urothelial carcinoma:  -I reviewed pathology with evidence of noninvasive high-grade urothelial carcinoma. He is scheduled for right nephroureterectomy with PLND, istillation of gemcitabine on 11/12/2022.   #3. Simple left renal cyst: We discussed this Bosniak category 3, no further follow-up is required.   #4. Prostate cancer screening: He is up-to-date with prostate cancer screening. Recommend PSA annually.        Next Appointment:      Next Appointment: 11/12/2022 12:00 PM    Appointment Type: Surgery     Location: [facility] [phone]    Provider: Jettie Pagan, M.D.    Reason for Visit: [facility]/SA (R) RA LAP RAD NEPHROURETERECTOMY WITH DR. Laverle Patter    Urology Preoperative H&P   Chief Complaint: Right ureteral tumor/upper tract urothelial carcinoma  History of Present Illness: NATALE BALCOM is a 67 y.o. male with a right ureteral tumor/upper tract urothelial carcinoma here for right nephroureterectomy. Denies fevers, chills, dysuria.    Past Medical History:  Diagnosis Date   Adopted    per pt unknown family medical history   Allergic rhinitis    Anxiety    Bladder cancer [facility])    urologist-- dr Retta Diones---   TURBT 11/ 2020   Chronic sinusitis    GERD (gastroesophageal reflux disease)    pt denies   History of 2019 novel coronavirus disease (COVID-19) 07/09/2019   positive result in epic, per pt no symptoms   History of bronchoscopy 11/24/2019   per pt this was done due to abnormal chest CT, sob, and hx bronchospasm 2020;  normal bronchoscopy with no lesion and no etiology of bronchospasms   History of Helicobacter pylori infection 11/2019   treated   Hypertension    Seasonal allergies     Past Surgical History:  Procedure Laterality Date   COLONOSCOPY WITH ESOPHAGOGASTRODUODENOSCOPY (EGD)  12/16/2019   CYSTOSCOPY WITH BIOPSY N/A 10/31/2020   Procedure: CYSTOSCOPY WITH TRANSURETRAL RESECTION OF  OF BLADDER NECK LESION;  Surgeon: Marcine Matar, MD;  Location: [facility] [place];  Service: Urology;  Laterality: N/A;   CYSTOSCOPY/URETEROSCOPY/HOLMIUM LASER/STENT PLACEMENT Right 10/24/2022   Procedure: CYSTOSCOPY/RIGHT URETEROSCOPY WITH  BIOPSY/RIGHT RETROGRADE PYELOGRAM/RIGHT STENT PLACEMENT;  Surgeon: Jannifer Hick, MD;  Location: [facility];  Service: Urology;  Laterality: Right;   SEPTOPLASTY WITH ETHMOIDECTOMY, AND MAXILLARY ANTROSTOMY  09/13/2020   TRANSURETHRAL RESECTION OF BLADDER TUMOR N/A 10/24/2022   Procedure: TRANSURETHRAL RESECTION OF BLADDER TUMOR (TURBT);  Surgeon: Jannifer Hick, MD;  Location: [facility];  Service: Urology;  Laterality: N/A;   TRANSURETHRAL RESECTION OF BLADDER TUMOR WITH MITOMYCIN-C N/A 01/26/2019   Procedure: TRANSURETHRAL RESECTION OF BLADDER TUMOR WITH MITOMYCIN-C;  Surgeon: Marcine Matar, MD;  Location: [facility];  Service: Urology;  Laterality: N/A;   VIDEO BRONCHOSCOPY N/A 11/24/2019   Procedure: VIDEO BRONCHOSCOPY WITHOUT FLUORO;  Surgeon: Tomma Lightning, MD;  Location: [facility];  Service: Pulmonary;  Laterality: N/A;    Allergies: No Known Allergies  Family History  Adopted: Yes  Problem Relation Age of Onset    Colon cancer Neg Hx    Esophageal cancer Neg Hx    Rectal cancer Neg Hx    Stomach cancer Neg Hx    Colon polyps Neg Hx     Social History:  reports that he quit smoking about 50 years ago. His smoking use included cigarettes. He started smoking about 52 years ago. He has never used smokeless tobacco. He reports current alcohol use of about 42.0 standard drinks of alcohol per week. He reports that he does not use drugs.  ROS: A complete review of systems was performed.  All systems are negative except for pertinent findings as noted.  Physical Exam:  Vital signs in last 24 hours:   Constitutional:  Alert and oriented, No acute distress Cardiovascular: Regular rate and rhythm Respiratory: Normal respiratory effort, Lungs clear bilaterally GI: Abdomen is soft, nontender, nondistended, no abdominal masses GU: No CVA tenderness Lymphatic: No lymphadenopathy Neurologic: Grossly intact, no focal deficits Psychiatric: Normal mood and affect  Laboratory Data:  No results for input(s): "WBC", "HGB", "HCT", "PLT" in the last 72 hours.  No results for input(s): "NA", "K", "CL", "GLUCOSE", "BUN", "CALCIUM", "CREATININE" in the last 72 hours.  Invalid input(s): "CO3"   No results found for this or any previous visit (from the past 24 hour(s)). No results found for this or any previous visit (from the past 240 hour(s)).  Renal Function: No results for input(s): "CREATININE" in the last 168 hours. CrCl cannot be calculated (Patient's most recent lab result is older than the maximum 21 days allowed.).  Radiologic Imaging: No results found.  I independently reviewed the above imaging studies.  Assessment and Plan MARKIE SOPP is a 67 y.o. male with a right ureteral tumor/upper tract urothelial carcinoma here for right nephroureterectomy, intravesical instillation of chemotherapy and pelvic lymph node dissection.    We have discussed the risks of treatment in detail including but not  limited to bleeding, infection, heart attack, stroke, death, venothromoboembolism, cancer recurrence, injury/damage to surrounding organs and structures, urine leak, the possibility of open surgical conversion for patients undergoing minimally invasive surgery, the risk of developing chronic kidney disease and its associated implications, and the potential risk of end stage renal disease possibly necessitating dialysis.   Matt R. Haaris Metallo MD 11/12/2022, 9:55 AM  [facility] Pager: [phone]

## 2022-11-12 NOTE — Anesthesia Preprocedure Evaluation (Addendum)
Anesthesia Evaluation  Patient identified by MRN, date of birth, ID band Patient awake    Reviewed: Allergy & Precautions, NPO status , Patient's Chart, lab work & pertinent test results  Airway Mallampati: II  TM Distance: >3 FB Neck ROM: Full    Dental  (+) Dental Advisory Given,    Pulmonary former smoker   breath sounds clear to auscultation       Cardiovascular hypertension, Pt. on medications  Rhythm:Regular Rate:Normal     Neuro/Psych   Anxiety     negative neurological ROS     GI/Hepatic Neg liver ROS,GERD  ,,  Endo/Other  negative endocrine ROS    Renal/GU negative Renal ROS     Musculoskeletal negative musculoskeletal ROS (+)    Abdominal   Peds  Hematology negative hematology ROS (+)   Anesthesia Other Findings   Reproductive/Obstetrics                             Anesthesia Physical Anesthesia Plan  ASA: 2  Anesthesia Plan: General   Post-op Pain Management: Tylenol PO (pre-op)*   Induction: Intravenous  PONV Risk Score and Plan: 3 and Ondansetron, Dexamethasone and Midazolam  Airway Management Planned: Oral ETT  Additional Equipment: None  Intra-op Plan:   Post-operative Plan: Extubation in OR  Informed Consent: I have reviewed the patients History and Physical, chart, labs and discussed the procedure including the risks, benefits and alternatives for the proposed anesthesia with the patient or authorized representative who has indicated his/her understanding and acceptance.     Dental advisory given  Plan Discussed with: CRNA  Anesthesia Plan Comments:        Anesthesia Quick Evaluation

## 2022-11-12 NOTE — Anesthesia Procedure Notes (Signed)
Procedure Name: Intubation Date/Time: 11/12/2022 11:39 AM  Performed by: Maurene Capes, CRNAPre-anesthesia Checklist: Patient identified, Emergency Drugs available, Suction available and Patient being monitored Patient Re-evaluated:Patient Re-evaluated prior to induction Oxygen Delivery Method: Circle System Utilized Preoxygenation: Pre-oxygenation with 100% oxygen Induction Type: IV induction Ventilation: Mask ventilation without difficulty Laryngoscope Size: Mac and 4 Grade View: Grade II Tube type: Oral Number of attempts: 1 Airway Equipment and Method: Rigid stylet Placement Confirmation: ETT inserted through vocal cords under direct vision, positive ETCO2 and breath sounds checked- equal and bilateral Secured at: 23 cm Tube secured with: Tape Dental Injury: Teeth and Oropharynx as per pre-operative assessment

## 2022-11-12 NOTE — Discharge Instructions (Signed)

## 2022-11-12 NOTE — Op Note (Signed)
Operative Note  Preoperative diagnosis:  1.  Right upper urinary tract carcinoma  Postoperative diagnosis: 1.  Right upper urinary tract carcinoma  Procedure(s): 1.  Robotic assisted right laparoscopic nephroureterectomy  Surgeon: Jettie Pagan, MD  Assistants:  Crecencio Mc, MD  Anesthesia:  General  Complications:  None  EBL:  50ml  Specimens: 1.  ID Type Source Tests Collected by Time Destination  1 : RIght kidney and ureter Tissue PATH GU biopsy SURGICAL PATHOLOGY Jannifer Hick, MD 11/12/2022 1435    Drains/Catheters: 1.  16 French Foley catheter 2.  15 French Blake drain  Intraoperative findings:   Atrophic right kidney, 2 arteries and 2 veins. Large distal right ureteral tumor with wide bladder neck negative margin. Excellent hemostasis. Excellent watertight closure of bladder.  Indication:  Billy Hernandez is a 67 y.o. male with a right distal ureteral tumor that was high grade noninvasive right upper tract urothelial carcinoma presenting for right robotic assisted laparoscopic nephroureterectomy.  Description of procedure: The indications, alternatives, benefits and risk were discussed with the patient and informed consent was obtained.  Patient was brought onto the operating room table, placed supine and secured with a safety strap.  All pressure points were carefully padded and pneumatic compression devices were placed on the lower extremities.  After the administration of intravenous antibiotics and general endotracheal anesthesia, a 16 French urethral catheter was inserted to drain the bladder.  The patient was repositioned in the left lateral decubitus position with the right side elevated at a 45 degree angle with the left lower leg placed at 90 degrees in the right upper leg extended.  An axillary roll was positioned to protect the brachial plexus and a gel pad was placed to support the back.  Multiple pillows were used to pad beneath the lower extremities to ensure  adequate cushioning.  The right upper extremity was placed into an armrest and gently padded.  The patient was secured in place across the hips, chest and legs with foam padding and cloth tape and the table was flexed.  The patient was prepped and draped in standard sterile manner.  The radiographic images were in the room.  A timeout was completed, verifying the correct patient, surgical procedure, site and positioning prior to beginning the procedure.  Pneumoperitoneum was introduced by placing a Veress needle into the abdomen and insufflated with CO2 to a pressure of 15 mmHg.  The camera trocar was placed along the right lateral edge of the rectus sheath slightly cephalad to the umbilicus and the 0 degree lens was inserted under direct visualization.  The abdominal cavity was examined for any sign of injury, adhesions, and identification of anatomic landmarks.  The remainder of the robotic trocars were placed along the lateral edge of the rectus sheath which included an 8 mm port below the costal margin, and two 8 mm ports caudal to the camera port.  A 12 mm assistant port was placed in the midline above the umbilicus.  The robot was then docked.  The parietal peritoneum was incised along the white line of Toldt and the colon was reflected medially, exposing Gerota's fascia.  The hepatic flexure was mobilized by dividing the triangular and coronary ligaments, allowing adequate retraction of the liver.  The duodenum was kocherized and the inferior vena cava was visualized.  Retroperitoneal fascia overlying the renal vessels was carefully separated, exposing the underlying renal vein.  The renal vein was carefully dissected and mobilized.  Main renal artery was identified deep  to the vein, carefully dissected, mobilized and divided using several weck clips. A separate accessory artery was also taken using several weck clips. Having intraoperative the renal vascular flow, the renal vein was heavily stapled and  divided.  A separate accessory vein was taken using weck clips. Gerota's fascia was mobilized circumferentially, maintaining meticulous hemostasis.  Special attention was given to the cranial connections to the adrenal gland, which were carefully divided, completely freeing the adrenal gland off the superior pole of the kidney.   For the distal ureterectomy and bladder cuff excision, the robotic instruments were removed and the robot was read docked in the appropriate orientation. A separate port was placed midline inferior to the umbilicus. The distal ureter was further dissected to the level of the bladder.  Gentle traction on the ureter confirmed the site of the intramural ureter and ureterovesical junction.  A 3-0 V-Loc stay suture was placed adjacent to this site and a circumferential incision was made through the mucosa, thereby referring the full-thickness bladder cuff. We ensured a wide margin given that he had an impressive distal tumor burden. The kidney and ureter were placed in the specimen retrieval bag and set aside.  A cystotomy was closed with the previously placed stay sutures and a second layer was oversewn in imbricating fashion.  The bladder was irrigated with normal saline confirming a watertight closure.  The operative field was inspected for bleeding or injury.  The insufflation pressure was reduced, again confirming the absence of bleeding.  Pneumoperitoneum was reestablished and a surgical drain was placed through the laparoscopic port into the pelvis and secured to the skin with a suture.  The robot was undocked and removed from the operative field.  The trocars were withdrawn under direct visualization.  The periumbilical incision was extended and the specimen she will bag was removed.  The entire specimen was sent to pathology for evaluation.  Anterior fascia at the umbilical site was carefully closed with a 1-0 PDS suture and Exparel was injected subcutaneously at the port sites.   Skin incisions were closed with 4 Monocryl sutures.  Patient was repositioned supine.  At the end of the procedure, all counts were correct.  The patient tolerated procedure well was taken to recovery room in satisfactory condition.  Matt R. Shaya Reddick MD [facility]  Pager: [phone]

## 2022-11-12 NOTE — Transfer of Care (Signed)
Immediate Anesthesia Transfer of Care Note  Patient: Billy Hernandez  Procedure(s) Performed: XI RIGHT ROBOT ASSISTED LAPAROSCOPIC RADICAL NEPHROURETERECTOMY (Right)  Patient Location: PACU  Anesthesia Type:General  Level of Consciousness: drowsy and patient cooperative  Airway & Oxygen Therapy: Patient Spontanous Breathing and Patient connected to nasal cannula oxygen  Post-op Assessment: Report given to RN and Post -op Vital signs reviewed and stable  Post vital signs: Reviewed and stable  Last Vitals:  Vitals Value Taken Time  BP 167/81 11/12/22 1617  Temp    Pulse 65 11/12/22 1621  Resp 9 11/12/22 1621  SpO2 98 % 11/12/22 1621  Vitals shown include unfiled device data.  Last Pain:  Vitals:   11/12/22 1030  TempSrc:   PainSc: 0-No pain         Complications: No notable events documented.

## 2022-11-13 ENCOUNTER — Encounter (HOSPITAL_COMMUNITY): Payer: Self-pay | Admitting: Urology

## 2022-11-13 ENCOUNTER — Other Ambulatory Visit: Payer: Self-pay

## 2022-11-13 LAB — BASIC METABOLIC PANEL
Anion gap: 6 (ref 5–15)
BUN: 10 mg/dL (ref 8–23)
CO2: 25 mmol/L (ref 22–32)
Calcium: 8.2 mg/dL — ABNORMAL LOW (ref 8.9–10.3)
Chloride: 92 mmol/L — ABNORMAL LOW (ref 98–111)
Creatinine, Ser: 0.97 mg/dL (ref 0.61–1.24)
GFR, Estimated: >60 mL/min (ref >60)
Glucose, Bld: 152 mg/dL — ABNORMAL HIGH (ref 70–99)
Potassium: 4.7 mmol/L (ref 3.5–5.1)
Sodium: 123 mmol/L — ABNORMAL LOW (ref 135–145)

## 2022-11-13 LAB — CBC
HCT: 33.9 % — ABNORMAL LOW (ref 39.0–52.0)
Hemoglobin: 11.7 g/dL — ABNORMAL LOW (ref 13.0–17.0)
MCH: 33 pg (ref 26.0–34.0)
MCHC: 34.5 g/dL (ref 30.0–36.0)
MCV: 95.5 fL (ref 80.0–100.0)
Platelets: 266 10*3/uL (ref 150–400)
RBC: 3.55 MIL/uL — ABNORMAL LOW (ref 4.22–5.81)
RDW: 12.2 % (ref 11.5–15.5)
WBC: 8 10*3/uL (ref 4.0–10.5)
nRBC: 0 % (ref 0.0–0.2)

## 2022-11-13 LAB — CREATININE, FLUID (PLEURAL, PERITONEAL, JP DRAINAGE): Creat, Fluid: 0.8 mg/dL

## 2022-11-13 MED ORDER — CHLORHEXIDINE GLUCONATE CLOTH 2 % EX PADS
6.0000 | MEDICATED_PAD | Freq: Every day | CUTANEOUS | Status: DC
  Administered 2022-11-13: 6 via TOPICAL

## 2022-11-13 NOTE — Plan of Care (Signed)
  Problem: Education: Goal: Knowledge of General Education information will improve Description: Including pain rating scale, medication(s)/side effects and non-pharmacologic comfort measures Outcome: Progressing   Problem: Elimination: Goal: Will not experience complications related to bowel motility Outcome: Progressing   Problem: Pain Managment: Goal: General experience of comfort will improve Outcome: Progressing   

## 2022-11-13 NOTE — TOC CM/SW Note (Signed)
Transition of Care [facility]) - Inpatient Brief Assessment   Patient Details  Name: Billy Hernandez MRN: [id number] Date of Birth: 27-Jun-1955  Transition [facility]) CM/SW Contact:    Howell Rucks, RN Phone Number: 11/13/2022, 11:30 AM   Clinical Narrative: Met with pt at bedside to introduce role of TOC/NCM and review for dc planning. Pt confirmed he has a PCP and pharmacy in place, no home  DME or home care  services, feels safe returning home with family support, confirmed has transportation available at discharge. TOC Brief Assessment completed. No TOC needs identified.     Transition of Care Asessment: Insurance and Status: Insurance coverage has been reviewed Patient has primary care physician: Yes Home environment has been reviewed: return home with family support Prior level of function:: Independent Prior/Current Home Services: No current home services Social Determinants of Health Reivew: SDOH reviewed no interventions necessary Readmission risk has been reviewed: Yes Transition of care needs: no transition of care needs at this time

## 2022-11-13 NOTE — Plan of Care (Signed)

## 2022-11-13 NOTE — Discharge Summary (Signed)
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 Briefly, Billy Hernandez is a 67 y.o. year old patient with a large right distal ureteral tumor confirmed to be right upper tract urothelial carcinoma who underwent right nephroureterectomy.   Hospital Course: The patient recovered in the usual expected fashion.  He had his diet advanced slowly.  Initially managed with IV pain control, then transitioned to PO meds when he was tolerating oral intake.  His labs were stable throughout the hospital course.  He was discharged to home on POD#1.  At the time of discharge the patient was tolerating a regular diet, passing flatus, ambulating, had adequate pain control and was agreeable to discharge.  Follow up as scheduled.    Laboratory values:  Recent Labs    11/12/22 1035 11/12/22 1650 11/13/22 0355  HGB 12.2* 13.2 11.7*  HCT 34.6* 38.5* 33.9*   Recent Labs    11/12/22 1650 11/13/22 0355  CREATININE 1.10 0.97    Disposition: Home  Discharge instruction: The patient was instructed to be ambulatory but told to refrain from heavy lifting, strenuous activity, or driving.   Discharge medications:  Allergies as of 11/13/2022   No Known Allergies      Medication List     TAKE these medications    busPIRone 10 MG tablet Commonly known as: BUSPAR Take 10 mg by mouth 2 (two) times daily.   docusate sodium 100 MG capsule Commonly known as: Colace Take 1 capsule (100 mg total) by mouth daily as needed for up to 30 doses.   lisinopril 10 MG tablet Commonly known as: ZESTRIL Take 10 mg by mouth daily. Started on 09/30/2022   oxyCODONE-acetaminophen 5-325 MG tablet Commonly known as: Percocet Take 1 tablet by mouth every 4 (four) hours as needed for up to 30 doses for  severe pain.   sildenafil 100 MG tablet Commonly known as: VIAGRA Take 50 mg by mouth daily as needed for erectile dysfunction.   traZODone 50 MG tablet Commonly known as: DESYREL Take 50 mg by mouth at bedtime.        Followup:   Follow-up Information     Jannifer Hick, MD Follow up on 11/20/2022.   Specialty: Urology Why: 12:30PM Contact information: [address] [place] [address] [phone]                 Dineen Kid. Filicia Scogin MD [facility]  Pager: [phone]

## 2022-11-13 NOTE — Progress Notes (Signed)
AVS reviewed w/ patient who verbalized an understanding. Pt states he will go home with the large foley collection bag.Pt is going home w/ a leg bag & a graduate to measure output. Pt states he has had foley's in the past and has no questions at this time. Pt states Billy Hernandez is coming to get him and she lives 15 minutes from [place]. Pt getting dressed. Primary RN updated.

## 2022-11-14 LAB — SURGICAL PATHOLOGY

## 2022-11-14 NOTE — Anesthesia Postprocedure Evaluation (Signed)
Anesthesia Post Note  Patient: Billy Hernandez  Procedure(s) Performed: XI RIGHT ROBOT ASSISTED LAPAROSCOPIC RADICAL NEPHROURETERECTOMY (Right)     Patient location during evaluation: PACU Anesthesia Type: General Level of consciousness: awake and alert Pain management: pain level controlled Vital Signs Assessment: post-procedure vital signs reviewed and stable Respiratory status: spontaneous breathing, nonlabored ventilation, respiratory function stable and patient connected to nasal cannula oxygen Cardiovascular status: blood pressure returned to baseline and stable Postop Assessment: no apparent nausea or vomiting Anesthetic complications: no   No notable events documented.              Shelton Silvas

## 2022-11-15 ENCOUNTER — Emergency Department (HOSPITAL_COMMUNITY)
Admission: EM | Admit: 2022-11-15 | Discharge: 2022-11-15 | Disposition: A | Discharge status: Home / Self Care | Payer: Medicare HMO | Attending: Emergency Medicine | Admitting: Emergency Medicine

## 2022-11-15 ENCOUNTER — Encounter (HOSPITAL_COMMUNITY): Payer: Self-pay | Admitting: Emergency Medicine

## 2022-11-15 ENCOUNTER — Other Ambulatory Visit: Payer: Self-pay

## 2022-11-15 DIAGNOSIS — N3289 Other specified disorders of bladder: Secondary | ICD-10-CM | POA: Diagnosis not present

## 2022-11-15 DIAGNOSIS — T83098A Other mechanical complication of other indwelling urethral catheter, initial encounter: Secondary | ICD-10-CM | POA: Diagnosis present

## 2022-11-15 DIAGNOSIS — T83091A Other mechanical complication of indwelling urethral catheter, initial encounter: Secondary | ICD-10-CM

## 2022-11-15 NOTE — ED Provider Notes (Signed)
[place] EMERGENCY DEPARTMENT AT [facility] Provider Note   CSN: [id number] Arrival date & time: 11/15/22  0115     History  No chief complaint on file.   Billy Hernandez is a 67 y.o. male.  The history is provided by the patient and medical records.   Billy Hernandez is a 67 y.o. male who presents to the Emergency Department complaining of catheter not working.  He presents to the emergency department for evaluation of nonfunctioning Foley catheter for 4 hours.  He is status post laparoscopic radical nephroureterectomy on 8/19 and was discharged home with a catheter in place.  He noticed this evening that his catheter was no longer draining and he felt fullness and distention to his bladder, which prompted emergency department visit.  No associated fever, chest pain, nausea, vomiting.  No blood in the catheter tubing.      Home Medications Prior to Admission medications   Medication Sig Start Date End Date Taking? Authorizing Provider  busPIRone (BUSPAR) 10 MG tablet Take 10 mg by mouth 2 (two) times daily. 09/10/22   [provider]  docusate sodium (COLACE) 100 MG capsule Take 1 capsule (100 mg total) by mouth daily as needed for up to 30 doses. 11/12/22   Jannifer Hick, MD  lisinopril (ZESTRIL) 10 MG tablet Take 10 mg by mouth daily. Started on 09/30/2022    [provider]  oxyCODONE-acetaminophen (PERCOCET) 5-325 MG tablet Take 1 tablet by mouth every 4 (four) hours as needed for up to 30 doses for severe pain. 11/12/22   Jannifer Hick, MD  sildenafil (VIAGRA) 100 MG tablet Take 50 mg by mouth daily as needed for erectile dysfunction.    [provider]  traZODone (DESYREL) 50 MG tablet Take 50 mg by mouth at bedtime. 08/18/19   [provider]      Allergies    Patient has no known allergies.    Review of Systems   Review of Systems  All other systems reviewed and are negative.   Physical Exam Updated Vital Signs BP (!)  160/95 (BP Location: Left Arm)   Pulse 74   Temp 98.5 F (36.9 C) (Oral)   Resp 18   SpO2 98%  Physical Exam Vitals and nursing note reviewed.  Constitutional:      Appearance: He is well-developed.  HENT:     Head: Normocephalic and atraumatic.  Cardiovascular:     Rate and Rhythm: Normal rate and regular rhythm.  Pulmonary:     Effort: Pulmonary effort is normal. No respiratory distress.  Abdominal:     Palpations: Abdomen is soft.     Tenderness: There is no guarding or rebound.     Comments: Appropriate postsurgical tenderness.  Surgical sites are clean, dry, intact without any surrounding erythema.  Genitourinary:    Comments: Foley catheter in place.  There is yellow clear urine in the bag. Musculoskeletal:        General: No tenderness.  Skin:    General: Skin is warm and dry.  Neurological:     Mental Status: He is alert and oriented to person, place, and time.  Psychiatric:        Behavior: Behavior normal.     ED Results / Procedures / Treatments   Labs (all labs ordered are listed, but only abnormal results are displayed) Labs Reviewed - No data to display  EKG None  Radiology No results found.  Procedures Procedures    Medications  Ordered in ED Medications - No data to display  ED Course/ Medical Decision Making/ A&P                                 Medical Decision Making  Patient here for evaluation of Foley catheter dysfunction.  Nursing was able to irrigate the catheter with 100 cc of fluid with return of 1400 cc.  Patient's symptoms resolved after this intervention.  No systemic symptoms or evidence of infection at this time.  Plan to discharge with Foley catheter care with return precautions, outpatient urology follow-up.        Final Clinical Impression(s) / ED Diagnoses Final diagnoses:  Obstruction of Foley catheter, initial encounter [facility])    Rx / DC Orders ED Discharge Orders     None         Tilden Fossa,  MD 11/15/22 [phone]

## 2022-11-15 NOTE — ED Triage Notes (Signed)
Pt reports nephroureterectomy today & has urinary cathter placed. Cath is now clogged and pt reports his bladder is not draining.

## 2022-11-15 NOTE — ED Notes (Signed)
Pt presented with bladder pain due to urinary retention. Pt stated in the past 4hrs foley drained about . Nurse irrigated foley with about and foley resumed draining. Now has a total of in foley bag. Md made aware.

## 2022-12-10 NOTE — Assessment & Plan Note (Addendum)
He is now post right nephroureterectomy. 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  We discussed management of upper tract urothelial carcinoma post definitive surgery.  We discussed adjuvant therapy for [facility] after resection.  Phase III POUT trial has demonstrated benefit of adjuvant therapy in patients with pT2-4 or pN1-3, M0 UTUC after nephroureterectomy.  Adjuvant therapy included four 21-day cycles of  gemcitabine and either cisplatin or, carboplatin once on day 1. Adjuvant therapy significantly improved DFS. Five-year disease-free estimates were 62% for those who received adjuvant chemotherapy and 45% for surveillance.  Final OS at 5 years were 66% in chemotherapy groups vs 57% in observation groups. However, more patients in a chemotherapy group had grade 3 or higher treatment-emergent adverse events compared to 4% with surveillance. NEJM 2020 and JCO 2024. We discussed the above.  Some of the potential side-effects included, though not limited to, including decreased blood count, decreased appetite, loss of taste, hearing loss, neuropathy, kidney damage, electrolyte abnormalities, edema, weight loss, life threatening infections, risk of allergic reactions, rash, increased liver enzyme, need for transfusions of blood products, nausea, vomiting, diarrhea, constipation, change in bowel habits, loss of hair, admission to hospital for various reasons, and risks of death.   Long term side-effects are also discussed including risks of infertility, permanent damage to nerve function, hearing loss, chronic fatigue, kidney damage with possibility needing hemodialysis, and rare secondary malignancy including bone marrow disorders.  Rare side effects such as pneumonitis, HUS, encephalopathy, visual loss or other blood abnormalities had been reported.  Given his age, solitary kidney, concerning for kidney toxicity, will elect to pursue carboplatin.  With Ed, his pathology showed pT2. He had  multiple recurrence of localized HG carcinoma. It is reasonable to consider adjuvant chemotherapy. HR for OS on pT2 favored chemotherapy in final analysis. 2024 JCO. It is reasonable to consider.   The alternative is nivolumab immunotherapy per CheckMate 274 trial. NEJM June 2021.  The trial included only 21% of patient with UTUC.  (96 renal pelvis and 53 ureter). The number of patients are not large enough to draw a clear conclusion and HR was above 1.  Side effects of immunotherapy are mostly related to immune related reaction.  They depend on which organ may be affected.  Patient can have hyper or hypothyroidism, skin rash, fever, pneumonitis, hepatitis, carditis, colitis, nephritis or other endorgan damage from immune related reaction.  Severe fatal reaction such as carditis or encephalitis has been reported. Rarely severe side effects can result in death.   Given the above, surveillance will be an option.  Per NCCN guidelines, cystoscopy and cytology every 3 months for 1 year and then a longer interval is reasonable.  Also recommended baseline CT chest, abdomen pelvis for evaluation.  After discussion Billy Hernandez understands and would like to proceed.  His wife Billy Hernandez was on the phone throughout the visit and also agrees with plan.  Arrange for teaching Request MMR testing Baseline CT CAP CBC, CMP Continue cystoscopy and cytology with Dr. Jettie Pagan. Patient will need to be evaluated before each treatment.

## 2022-12-10 NOTE — Progress Notes (Unsigned)
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   Urothelial carcinoma (HCC) He is now post right nephroureterectomy. 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  We discussed management of upper tract urothelial carcinoma post definitive surgery.  We discussed adjuvant therapy for [facility] after resection.  Phase III POUT trial has demonstrated benefit of adjuvant therapy in patients with pT2-4 or pN1-3, M0 UTUC after nephroureterectomy.  Adjuvant therapy included four 21-day cycles of  gemcitabine and either cisplatin or, carboplatin once on day 1. Adjuvant therapy significantly improved DFS. Five-year disease-free estimates were 62% for those who received adjuvant chemotherapy and 45% for surveillance.  Final OS at 5 years were 66% in chemotherapy groups vs 57% in observation groups. However, more patients in a chemotherapy group had grade 3 or higher treatment-emergent adverse events compared to 4% with surveillance. NEJM 2020 and JCO 2024. We discussed the above.  Some of the potential side-effects included, though not limited to, including decreased blood count, decreased appetite, loss of taste, hearing loss, neuropathy, kidney damage, electrolyte abnormalities, edema, weight loss, life threatening infections, risk of allergic reactions, rash, increased liver enzyme, need for transfusions of blood products, nausea, vomiting, diarrhea, constipation, change in bowel habits, loss of hair, admission to hospital for various reasons, and risks of death.   Long term side-effects are also discussed including risks of infertility, permanent damage to nerve function, hearing loss, chronic fatigue, kidney damage with possibility needing hemodialysis, and rare  secondary malignancy including bone marrow disorders.  Rare side effects such as pneumonitis, HUS, encephalopathy, visual loss or other blood abnormalities had been reported.  Given his age, solitary kidney, concerning for kidney toxicity, will elect to pursue carboplatin.  With Ed, his pathology showed pT2. He had multiple recurrence of localized HG carcinoma. It is reasonable to consider adjuvant chemotherapy. HR for OS on pT2 favored chemotherapy in final analysis. 2024 JCO. It is reasonable to consider.   The alternative is nivolumab immunotherapy per CheckMate 274 trial. NEJM June 2021.  The trial included only 21% of patient with UTUC.  (96 renal pelvis and 53 ureter). The number of patients are not large enough to draw a clear conclusion and HR was above 1.  Side effects of immunotherapy are mostly related to immune related reaction.  They depend on which organ may be affected.  Patient can have hyper or hypothyroidism, skin rash, fever, pneumonitis, hepatitis, carditis, colitis, nephritis or other endorgan damage from immune related reaction.  Severe fatal reaction such as carditis or encephalitis has been reported. Rarely severe side effects can result in death.   Given the above, surveillance will be an option.  Per NCCN guidelines, cystoscopy and cytology every 3 months for 1 year and then a longer interval is reasonable.  Also recommended baseline CT chest, abdomen pelvis for evaluation.  After discussion Billy Hernandez understands and would like to proceed.  Arrange for teaching Request MMR testing Baseline CT CAP CBC, CMP Continue cystoscopy and cytology with Dr. Jettie Pagan. Patient will need to be evaluated before each treatment.    Orders Placed This Encounter  Procedures   CT CHEST ABDOMEN PELVIS W CONTRAST    Standing Status:   Future    Standing Expiration Date:   12/13/2023  Order Specific Question:   If indicated for the ordered procedure, I authorize the administration of  contrast media per Radiology protocol    Answer:   Yes    Order Specific Question:   Does the patient have a contrast media/X-ray dye allergy?    Answer:   No    Order Specific Question:   Preferred imaging location?    Answer:   [facility]    Order Specific Question:   If indicated for the ordered procedure, I authorize the administration of oral contrast media per Radiology protocol    Answer:   Yes   CBC with Differential (Cancer Center Only)    Standing Status:   Future    Number of Occurrences:   1    Standing Expiration Date:   12/13/2023   CMP (Cancer Center only)    Standing Status:   Future    Number of Occurrences:   1    Standing Expiration Date:   12/13/2023   CBC with Differential (Cancer Center Only)    Standing Status:   Future    Standing Expiration Date:   01/08/2024   CMP (Cancer Center only)    Standing Status:   Future    Standing Expiration Date:   01/08/2024   CBC with Differential (Cancer Center Only)    Standing Status:   Future    Standing Expiration Date:   01/15/2024   CMP (Cancer Center only)    Standing Status:   Future    Standing Expiration Date:   01/15/2024   CBC with Differential (Cancer Center Only)    Standing Status:   Future    Standing Expiration Date:   01/29/2024   CMP (Cancer Center only)    Standing Status:   Future    Standing Expiration Date:   01/29/2024   CBC with Differential (Cancer Center Only)    Standing Status:   Future    Standing Expiration Date:   02/05/2024   CMP (Cancer Center only)    Standing Status:   Future    Standing Expiration Date:   02/05/2024   All questions were answered. The patient knows to call the clinic with any problems, questions or concerns. No barriers to learning was detected.  Billy Sartorius, MD 9/19/20241:13 PM  CHIEF COMPLAINTS/PURPOSE OF CONSULTATION:  Bladder cancer  HISTORY OF PRESENTING ILLNESS:  Billy Hernandez 67 y.o. male is here because of history of bladder cancer I  have reviewed his chart and materials related to his cancer extensively and collaborated history with the patient. Summary of oncologic history is as follows:    Oncology History  Urothelial carcinoma (HCC)  12/2018 Miscellaneous   Initially presented with hematuria. 12/2018 CT with 3.5 cm mass in the posterior right bladder.    01/2019 Surgery   TURBT  with gemcitabine for 3.5cm right trigonal tumor. Path: HG papillary urothelial carcinoma.    04/2019 Procedure   Completed 6/6 BCG induction in  and completed 3 maintenance BCG cycles.    10/2020 Surgery   TURBT a small papillary lesion at the bladder neck/prostatic junction. Pathology: Noninvasive high grade papillary urothelial carcinoma. Did not undergo BCG following TURBT in 10/2020   09/25/2022 Procedure   Cystoscopy with tumor protruding from the right ureteral orifice with adjacent mass affect from the distal right ureter. No papillary lesions within the bladder itself.   10/24/2022 Surgery   TURBT  A. BLADDER LESION, RIGHT LATERAL WALL, BIOPSY:  Noninvasive high-grade urothelial  carcinoma with early papillary formation  Muscularis propria (detrusor muscle) is not present   B. URETERAL TUMOR, RIGHT, BIOPSY:  Noninvasive high-grade urothelial carcinoma    11/12/2022 Initial Diagnosis   Urothelial carcinoma (HCC)   11/12/2022 Surgery   Robotic assisted right laparoscopic nephroureterectomy    A. 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, size 1.8 and 2.7 cm  The carcinoma invades the muscularis (pT2)  Lymphovascular invasion is identified  Urothelial carcinoma in situ is present at renal pelvis  All margins of resection are negative for carcinoma   RENAL PELVIS AND URETER:   Procedure: Nephroureterectomy  Specimen Laterality: Right  Tumor Site: Distal ureter  Histologic Type: Urothelial carcinoma  Histologic Grade: High grade  Tumor Extension: Muscularis   Lymphovascular Invasion: Identified  Margin status for invasive carcinoma: Negative  Margin status for carcinoma in situ/ Noninvasive papillary urothelial  carcinoma: Negative  Regional Lymph Nodes:          No Lymph Nodes Submitted or found: X          Number of Lymph Nodes Involved: NA          Size of Largest Nodal Metastatic Deposit/ specify site: NA          Number of Lymph Nodes Examined: NA  Distant Metastasis: NA  Pathologic Stage Classification (pTNM, AJCC 8th Edition): pT2, pN  Pathologic Findings in Ipsilateral Nonneoplastic Renal Tissue: chronic  obstructive changes with prominent pyelonephritis    12/10/2022 Cancer Staging   Staging form: Renal Pelvis and Ureter, AJCC 8th Edition - Clinical: Stage Unknown (cT2, cNX, cM0) - Signed by Billy Sartorius, MD on 12/10/2022 Stage prefix: Initial diagnosis WHO/ISUP grade (low/high): High Grade Histologic grading system: 2 grade system   01/08/2023 -  Chemotherapy   Patient is on Treatment Plan : BLADDER Carboplatin D1 + Gemcitabine D1,8 q21d      Overall he is feeling well.  He recovered from his surgery and is doing very well.  He denies any physical limitations.  No difficulty with urination.  MEDICAL HISTORY:  Past Medical History:  Diagnosis Date   Adopted    per pt unknown family medical history   Allergic rhinitis    Anxiety    Bladder cancer [facility])    urologist-- dr Retta Diones---  TURBT 11/ 2020   Chronic sinusitis    GERD (gastroesophageal reflux disease)    pt denies   History of 2019 novel coronavirus disease (COVID-19) 07/09/2019   positive result in epic, per pt no symptoms   History of bronchoscopy 11/24/2019   per pt this was done due to abnormal chest CT, sob, and hx bronchospasm 2020;  normal bronchoscopy with no lesion and no etiology of bronchospasms   History of Helicobacter pylori infection 11/2019   treated   Hypertension    Seasonal allergies     SURGICAL HISTORY: Past Surgical History:   Procedure Laterality Date   COLONOSCOPY WITH ESOPHAGOGASTRODUODENOSCOPY (EGD)  12/16/2019   CYSTOSCOPY WITH BIOPSY N/A 10/31/2020   Procedure: CYSTOSCOPY WITH TRANSURETRAL RESECTION OF  OF BLADDER NECK LESION;  Surgeon: Marcine Matar, MD;  Location: [facility];  Service: Urology;  Laterality: N/A;   CYSTOSCOPY/URETEROSCOPY/HOLMIUM LASER/STENT PLACEMENT Right 10/24/2022   Procedure: CYSTOSCOPY/RIGHT URETEROSCOPY WITH  BIOPSY/RIGHT RETROGRADE PYELOGRAM/RIGHT STENT PLACEMENT;  Surgeon: Jannifer Hick, MD;  Location: [facility];  Service: Urology;  Laterality: Right;   ROBOT ASSITED LAPAROSCOPIC NEPHROURETERECTOMY Right 11/12/2022   Procedure: XI RIGHT ROBOT ASSISTED  LAPAROSCOPIC RADICAL NEPHROURETERECTOMY;  Surgeon: Jannifer Hick, MD;  Location: [facility];  Service: Urology;  Laterality: Right;   SEPTOPLASTY WITH ETHMOIDECTOMY, AND MAXILLARY ANTROSTOMY  09/13/2020   TRANSURETHRAL RESECTION OF BLADDER TUMOR N/A 10/24/2022   Procedure: TRANSURETHRAL RESECTION OF BLADDER TUMOR (TURBT);  Surgeon: Jannifer Hick, MD;  Location: [facility];  Service: Urology;  Laterality: N/A;   TRANSURETHRAL RESECTION OF BLADDER TUMOR WITH MITOMYCIN-C N/A 01/26/2019   Procedure: TRANSURETHRAL RESECTION OF BLADDER TUMOR WITH MITOMYCIN-C;  Surgeon: Marcine Matar, MD;  Location: [facility];  Service: Urology;  Laterality: N/A;   VIDEO BRONCHOSCOPY N/A 11/24/2019   Procedure: VIDEO BRONCHOSCOPY WITHOUT FLUORO;  Surgeon: Tomma Lightning, MD;  Location: [facility];  Service: Pulmonary;  Laterality: N/A;    SOCIAL HISTORY: Social History   Socioeconomic History   Marital status: Married    Spouse name: Not on file   Number of children: Not on file   Years of education: Not on file   Highest education level: Not on file  Occupational History   Not on file  Tobacco Use   Smoking status: Former    Current packs/day: 0.00    Types: Cigarettes    Start date: 01/22/1970    Quit date:  01/23/1972    Years since quitting: 50.9   Smokeless tobacco: Never  Vaping Use   Vaping status: Never Used  Substance and Sexual Activity   Alcohol use: Yes    Alcohol/week: 42.0 standard drinks of alcohol    Types: 42 Cans of beer per week    Comment: 3-4 per day   Drug use: Never   Sexual activity: Not on file    Comment: vasectomy  Other Topics Concern   Not on file  Social History Narrative   Not on file   Social Determinants of Health   Financial Resource Strain: Not on file  Food Insecurity: No Food Insecurity (11/13/2022)   Hunger Vital Sign    Worried About Running Out of Food in the Last Year: Never true    Ran Out of Food in the Last Year: Never true  Transportation Needs: No Transportation Needs (11/13/2022)   PRAPARE - Administrator, Civil Service (Medical): No    Lack of Transportation (Non-Medical): No  Physical Activity: Not on file  Stress: Not on file  Social Connections: Not on file  Intimate Partner Violence: Not At Risk (11/13/2022)   Humiliation, Afraid, Rape, and Kick questionnaire    Fear of Current or Ex-Partner: No    Emotionally Abused: No    Physically Abused: No    Sexually Abused: No    FAMILY HISTORY: Family History  Adopted: Yes  Problem Relation Age of Onset   Colon cancer Neg Hx    Esophageal cancer Neg Hx    Rectal cancer Neg Hx    Stomach cancer Neg Hx    Colon polyps Neg Hx     ALLERGIES:  has No Known Allergies.  MEDICATIONS:  Current Outpatient Medications  Medication Sig Dispense Refill   busPIRone (BUSPAR) 10 MG tablet Take 10 mg by mouth 2 (two) times daily.     lisinopril (ZESTRIL) 10 MG tablet Take 10 mg by mouth daily. Started on 09/30/2022     sildenafil (VIAGRA) 100 MG tablet Take 50 mg by mouth daily as needed for erectile dysfunction.     tamsulosin (FLOMAX) 0.4 MG CAPS capsule Take 0.4 mg by mouth daily.     traZODone (DESYREL) 50  MG tablet Take 50 mg by mouth at bedtime.     [START ON 01/07/2023]  dexamethasone (DECADRON) 4 MG tablet Take 1 tablets (4mg ) by mouth daily starting the day after carboplatin for 3 days. Take with food 30 tablet 1   [START ON 01/07/2023] lidocaine-prilocaine (EMLA) cream Apply to affected area once 30 g 3   [START ON 01/07/2023] ondansetron (ZOFRAN) 8 MG tablet Take 1 tablet (8 mg total) by mouth every 8 (eight) hours as needed for nausea or vomiting. Start on the third day after carboplatin. 30 tablet 1   [START ON 01/07/2023] prochlorperazine (COMPAZINE) 10 MG tablet Take 1 tablet (10 mg total) by mouth every 6 (six) hours as needed for nausea or vomiting. 30 tablet 1   No current facility-administered medications for this visit.    REVIEW OF SYSTEMS:   All relevant systems were reviewed with the patient and are negative.  PHYSICAL EXAMINATION: ECOG PERFORMANCE STATUS: 0 - Asymptomatic  Vitals:   12/13/22 0939  BP: (!) 153/89  Pulse: 76  Resp: 20  Temp: 97.7 F (36.5 C)  SpO2: 100%   Filed Weights   12/13/22 0939  Weight: 179 lb 3.2 oz (81.3 kg)    GENERAL: alert, no distress and comfortable SKIN: skin color is normal, no jaundice, rashes or significant lesions EYES: sclera clear OROPHARYNX: no exudate, no erythema NECK: supple LYMPH:  no palpable lymphadenopathy in the cervical regions LUNGS: Effort normal, no respiratory distress.  Clear to auscultation bilaterally HEART: regular rate & rhythm and no lower extremity edema ABDOMEN: soft, non-tender and nondistended Musculoskeletal: no point tenderness NEURO: no focal motor/sensory deficits  LABORATORY DATA:  I have reviewed the data as listed Lab Results  Component Value Date   WBC 4.4 12/13/2022   HGB 12.8 (L) 12/13/2022   HCT 37.0 (L) 12/13/2022   MCV 94.4 12/13/2022   PLT 260 12/13/2022   Recent Labs    11/12/22 1650 11/13/22 0355 12/13/22 1050  NA 128* 123* 131*  K 4.5 4.7 4.2  CL 93* 92* 98  CO2 20* 25 29  GLUCOSE 179* 152* 99  BUN 11 10 9   CREATININE 1.10 0.97  0.97  CALCIUM 9.0 8.2* 9.3  GFRNONAA >60 >60 >60  PROT  --   --  7.2  ALBUMIN  --   --  4.2  AST  --   --  14*  ALT  --   --  11  ALKPHOS  --   --  50  BILITOT  --   --  0.7    RADIOGRAPHIC STUDIES: I have personally reviewed the radiological images as listed and agreed with the findings in the report. No results found.

## 2022-12-13 ENCOUNTER — Inpatient Hospital Stay: Payer: Medicare HMO | Attending: Hematology and Oncology

## 2022-12-13 ENCOUNTER — Other Ambulatory Visit: Payer: Medicare HMO

## 2022-12-13 ENCOUNTER — Inpatient Hospital Stay: Payer: Medicare HMO

## 2022-12-13 VITALS — BP 153/89 | HR 76 | Temp 97.7°F | Resp 20 | Wt 179.2 lb

## 2022-12-13 DIAGNOSIS — I10 Essential (primary) hypertension: Secondary | ICD-10-CM | POA: Insufficient documentation

## 2022-12-13 DIAGNOSIS — C679 Malignant neoplasm of bladder, unspecified: Secondary | ICD-10-CM | POA: Insufficient documentation

## 2022-12-13 DIAGNOSIS — Z87891 Personal history of nicotine dependence: Secondary | ICD-10-CM | POA: Diagnosis not present

## 2022-12-13 DIAGNOSIS — C689 Malignant neoplasm of urinary organ, unspecified: Secondary | ICD-10-CM

## 2022-12-13 DIAGNOSIS — K219 Gastro-esophageal reflux disease without esophagitis: Secondary | ICD-10-CM | POA: Diagnosis not present

## 2022-12-13 DIAGNOSIS — F419 Anxiety disorder, unspecified: Secondary | ICD-10-CM | POA: Insufficient documentation

## 2022-12-13 DIAGNOSIS — Z79899 Other long term (current) drug therapy: Secondary | ICD-10-CM | POA: Insufficient documentation

## 2022-12-13 LAB — CMP (CANCER CENTER ONLY)
ALT: 11 U/L (ref 0–44)
AST: 14 U/L — ABNORMAL LOW (ref 15–41)
Albumin: 4.2 g/dL (ref 3.5–5.0)
Alkaline Phosphatase: 50 U/L (ref 38–126)
Anion gap: 4 — ABNORMAL LOW (ref 5–15)
BUN: 9 mg/dL (ref 8–23)
CO2: 29 mmol/L (ref 22–32)
Calcium: 9.3 mg/dL (ref 8.9–10.3)
Chloride: 98 mmol/L (ref 98–111)
Creatinine: 0.97 mg/dL (ref 0.61–1.24)
GFR, Estimated: >60 mL/min (ref >60)
Glucose, Bld: 99 mg/dL (ref 70–99)
Potassium: 4.2 mmol/L (ref 3.5–5.1)
Sodium: 131 mmol/L — ABNORMAL LOW (ref 135–145)
Total Bilirubin: 0.7 mg/dL (ref 0.3–1.2)
Total Protein: 7.2 g/dL (ref 6.5–8.1)

## 2022-12-13 LAB — CBC WITH DIFFERENTIAL (CANCER CENTER ONLY)
Abs Immature Granulocytes: 0 10*3/uL (ref 0.00–0.07)
Basophils Absolute: 0 10*3/uL (ref 0.0–0.1)
Basophils Relative: 1 %
Eosinophils Absolute: 0.6 10*3/uL — ABNORMAL HIGH (ref 0.0–0.5)
Eosinophils Relative: 13 %
HCT: 37 % — ABNORMAL LOW (ref 39.0–52.0)
Hemoglobin: 12.8 g/dL — ABNORMAL LOW (ref 13.0–17.0)
Immature Granulocytes: 0 %
Lymphocytes Relative: 23 %
Lymphs Abs: 1 10*3/uL (ref 0.7–4.0)
MCH: 32.7 pg (ref 26.0–34.0)
MCHC: 34.6 g/dL (ref 30.0–36.0)
MCV: 94.4 fL (ref 80.0–100.0)
Monocytes Absolute: 0.9 10*3/uL (ref 0.1–1.0)
Monocytes Relative: 19 %
Neutro Abs: 2 10*3/uL (ref 1.7–7.7)
Neutrophils Relative %: 44 %
Platelet Count: 260 10*3/uL (ref 150–400)
RBC: 3.92 MIL/uL — ABNORMAL LOW (ref 4.22–5.81)
RDW: 12 % (ref 11.5–15.5)
WBC Count: 4.4 10*3/uL (ref 4.0–10.5)
nRBC: 0 % (ref 0.0–0.2)

## 2022-12-13 MED ORDER — DEXAMETHASONE 4 MG PO TABS
ORAL_TABLET | ORAL | 1 refills | Status: DC
Start: 2023-01-07 — End: 2023-12-25

## 2022-12-13 MED ORDER — PROCHLORPERAZINE MALEATE 10 MG PO TABS: 10.0000 mg | ORAL_TABLET | Freq: Four times a day (QID) | ORAL | 1 refills | Status: DC | PRN

## 2022-12-13 MED ORDER — ONDANSETRON HCL 8 MG PO TABS: 8.0000 mg | ORAL_TABLET | Freq: Three times a day (TID) | ORAL | 1 refills | Status: DC | PRN

## 2022-12-13 MED ORDER — LIDOCAINE-PRILOCAINE 2.5-2.5 % EX CREA
TOPICAL_CREAM | CUTANEOUS | 3 refills | Status: DC
Start: 2023-01-07 — End: 2023-01-02

## 2022-12-13 NOTE — Progress Notes (Signed)
START OFF PATHWAY REGIMEN - Bladder   [id number]:Carboplatin AUC=4.5 IV D1 + Gemcitabine 1,000 mg/m2 IV D1,8 q21 Days:   A cycle is every 21 days:     Gemcitabine      Carboplatin   **Always confirm dose/schedule in your pharmacy ordering system**  Patient Characteristics: Post-Cystectomy without Neoadjuvant Therapy, M0 (Pathologic Staging), pT0-2, pN0, M0 Therapeutic Status: Post-Cystectomy without Neoadjuvant Therapy, M0 (Pathologic Staging) AJCC M Category: cM0 AJCC 8 Stage Grouping: II AJCC T Category: pT2 AJCC N Category: pN0 Intent of Therapy: Curative Intent, Discussed with Patient

## 2022-12-13 NOTE — Patient Instructions (Addendum)
We discussed chemotherapy based on Phase III POUT trial has demonstrated benefit in patients with pT2-4 or pN1-3, M0 UTUC after nephroureterectomy.  Adjuvant therapy included four 21-day cycles of  gemcitabine and either cisplatin or carboplatin. Five-year disease-free estimates were 62% for those who received adjuvant chemotherapy and 45% for surveillance.  Final overall survival at 5 years were 66% in chemotherapy groups vs 57% in observation groups. However, more patients in a chemotherapy group had grade 3 or higher treatment-emergent adverse events compared to 4% with surveillance. NEJM 2020 and JCO 2024.  Some of the potential side-effects included, though not limited to, including decreased blood count, decreased appetite, loss of taste, hearing loss, neuropathy, kidney damage, electrolyte abnormalities, edema, weight loss, life threatening infections, risk of allergic reactions, rash, increased liver enzyme, need for transfusions of blood products, nausea, vomiting, diarrhea, constipation, change in bowel habits, loss of hair, admission to hospital for various reasons, and risks of death.   Long term side-effects are also discussed including risks of infertility, permanent damage to nerve function, hearing loss, chronic fatigue, kidney damage with possibility needing hemodialysis, and rare secondary malignancy including bone marrow disorders.  Rare side effects such as pneumonitis, HUS, encephalopathy, visual loss or other blood abnormalities had been reported. Given your age and solitary kidney, we elect carboplatin as it does not result in kidney toxicity as above.  With Ed, pathology showed pT2. He had multiple recurrence of localized HG carcinoma. It is reasonable to consider adjuvant chemotherapy. Survival data on pT2 favored chemotherapy in final analysis. 2024 JCO. It is reasonable to consider.   The alternative is nivolumab immunotherapy per CheckMate 274 trial. NEJM June 2021.  The trial  included only 21% of patient with UTUC.  (96 renal pelvis and 53 ureter). The number of patients are not large enough to draw a clear conclusion.  Side effects of immunotherapy are mostly related to immune related reaction.  They depend on which organ may be affected.  Patient can have hyper or hypothyroidism, skin rash, fever, pneumonitis, hepatitis, carditis, colitis, nephritis or other endorgan damage from immune related reaction.  Severe fatal reaction such as carditis or encephalitis has been reported. Rarely severe side effects can result in death.

## 2022-12-14 ENCOUNTER — Telehealth: Payer: Self-pay

## 2022-12-14 NOTE — Telephone Encounter (Signed)
Pt called requesting a telephone or MyChart video visit with Dr. Cherly Hensen next week to discuss pt's tx plan.  Pt and spouse want to both talk to Dr. Cherly Hensen regarding chemotherapy and the pt's tx plan.  Stated this nurse will send a Scheduling message to Dr. Nelta Numbers scheduler to schedule the appt.  Stated the Scheduler would contact him to get him scheduled.  Also, stated that this nurse will make Dr. Cherly Hensen aware of the pt's & wife's request.  Pt verbalized understanding and had no further questions or concerns.

## 2022-12-15 ENCOUNTER — Other Ambulatory Visit: Payer: Self-pay

## 2022-12-19 NOTE — Assessment & Plan Note (Signed)
67 y.o. 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

## 2022-12-19 NOTE — Progress Notes (Unsigned)
Patient Care Team: System, Provider Not In as PCP - General Melven Sartorius, MD as Consulting Physician (Hematology and Oncology)  Clinic Day:  12/20/2022  Referring physician: Jannifer Hick, MD  ASSESSMENT & PLAN:   Assessment & Plan: Urothelial carcinoma [facility]) 67 y.o. 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   Today we discussed his pathology results, indicating high-grade upper tract disease in the ureter.  His CT scan did not show distant metastases.  He asked about constipation doing today after the holidays.  This will be over 3 months from his surgery.  The study did showed early separation of curve on disease-free survival and metastasis free survival.  I let him know if we delay treatment, repeat CT to restage him before treatment should be done to ensure there is no signs of metastasis.  After discussion, he feels comfortable proceeding with treatment as scheduled  Patient is scheduled on 10/11 potentially Plan to start cycle 1 on 10/15 after that and office visit Patient will take Compazine 10 mg twice daily on day 1 and history of each cycle  The patient understands the plans discussed today and is in agreement with them.  He knows to contact our office if he develops concerns prior to his next appointment.  I provided 33 minutes of face-to-face time during this encounter and > 50% was spent counseling as documented under my assessment and plan.    Melven Sartorius, MD  [facility] AT [facility] [address] [place] [address] Dept: [phone] Dept Fax: [phone]   No orders of the defined types were placed in this encounter.     CHIEF COMPLAINT:  CC: History of UC  Current Treatment: To be started  INTERVAL HISTORY:   Patient and his wife question about if we can start later after the New Year's.  We went over the pathology  today.  His CT scan did not show any concerning findings.  I have reviewed the past medical history, past surgical history, social history and family history with the patient and they are unchanged from previous note.  ALLERGIES:  has No Known Allergies.  MEDICATIONS:  Current Outpatient Medications  Medication Sig Dispense Refill   busPIRone (BUSPAR) 10 MG tablet Take 10 mg by mouth 2 (two) times daily.     [START ON 01/07/2023] dexamethasone (DECADRON) 4 MG tablet Take 1 tablets (4mg ) by mouth daily starting the day after carboplatin for 3 days. Take with food 30 tablet 1   [START ON 01/07/2023] lidocaine-prilocaine (EMLA) cream Apply to affected area once 30 g 3   lisinopril (ZESTRIL) 10 MG tablet Take 10 mg by mouth daily. Started on 09/30/2022     [START ON 01/07/2023] ondansetron (ZOFRAN) 8 MG tablet Take 1 tablet (8 mg total) by mouth every 8 (eight) hours as needed for nausea or vomiting. Start on the third day after carboplatin. 30 tablet 1   [START ON 01/07/2023] prochlorperazine (COMPAZINE) 10 MG tablet Take 1 tablet (10 mg total) by mouth every 6 (six) hours as needed for nausea or vomiting. 30 tablet 1   sildenafil (VIAGRA) 100 MG tablet Take 50 mg by mouth daily as needed for erectile dysfunction.     tamsulosin (FLOMAX) 0.4 MG CAPS capsule Take 0.4 mg by mouth daily.     traZODone (DESYREL) 50 MG tablet Take 50 mg by mouth at bedtime.  No current facility-administered medications for this visit.    HISTORY OF PRESENT ILLNESS:   Oncology History  Urothelial carcinoma (HCC)  12/2018 Miscellaneous   Initially presented with hematuria. 12/2018 CT with 3.5 cm mass in the posterior right bladder.    01/2019 Surgery   TURBT  with gemcitabine for 3.5cm right trigonal tumor. Path: HG papillary urothelial carcinoma.    04/2019 Procedure   Completed 6/6 BCG induction in  and completed 3 maintenance BCG cycles.    10/2020 Surgery   TURBT a small papillary lesion at the bladder  neck/prostatic junction. Pathology: Noninvasive high grade papillary urothelial carcinoma. Did not undergo BCG following TURBT in 10/2020   09/25/2022 Procedure   Cystoscopy with tumor protruding from the right ureteral orifice with adjacent mass affect from the distal right ureter. No papillary lesions within the bladder itself.   10/24/2022 Surgery   TURBT  A. BLADDER LESION, RIGHT LATERAL WALL, BIOPSY:  Noninvasive high-grade urothelial carcinoma with early papillary formation  Muscularis propria (detrusor muscle) is not present   B. URETERAL TUMOR, RIGHT, BIOPSY:  Noninvasive high-grade urothelial carcinoma    11/12/2022 Initial Diagnosis   Urothelial carcinoma (HCC)   11/12/2022 Surgery   Robotic assisted right laparoscopic nephroureterectomy    A. 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, size 1.8 and 2.7 cm  The carcinoma invades the muscularis (pT2)  Lymphovascular invasion is identified  Urothelial carcinoma in situ is present at renal pelvis  All margins of resection are negative for carcinoma   RENAL PELVIS AND URETER:   Procedure: Nephroureterectomy  Specimen Laterality: Right  Tumor Site: Distal ureter  Histologic Type: Urothelial carcinoma  Histologic Grade: High grade  Tumor Extension: Muscularis  Lymphovascular Invasion: Identified  Margin status for invasive carcinoma: Negative  Margin status for carcinoma in situ/ Noninvasive papillary urothelial  carcinoma: Negative  Regional Lymph Nodes:          No Lymph Nodes Submitted or found: X          Number of Lymph Nodes Involved: NA          Size of Largest Nodal Metastatic Deposit/ specify site: NA          Number of Lymph Nodes Examined: NA  Distant Metastasis: NA  Pathologic Stage Classification (pTNM, AJCC 8th Edition): pT2, pN  Pathologic Findings in Ipsilateral Nonneoplastic Renal Tissue: chronic  obstructive changes with prominent pyelonephritis     12/10/2022 Cancer Staging   Staging form: Renal Pelvis and Ureter, AJCC 8th Edition - Clinical: Stage Unknown (cT2, cNX, cM0) - Signed by Melven Sartorius, MD on 12/10/2022 Stage prefix: Initial diagnosis WHO/ISUP grade (low/high): High Grade Histologic grading system: 2 grade system   01/08/2023 -  Chemotherapy   Patient is on Treatment Plan : BLADDER Carboplatin D1 + Gemcitabine D1,8 q21d         REVIEW OF SYSTEMS:   NA   VITALS:  There were no vitals taken for this visit.  Wt Readings from Last 3 Encounters:  12/13/22 179 lb 3.2 oz (81.3 kg)  11/13/22 179 lb 15.4 oz (81.6 kg)  10/24/22 180 lb (81.6 kg)    There is no height or weight on file to calculate BMI.  Performance status (ECOG): 0 - Asymptomatic  PHYSICAL EXAM:   NA  LABORATORY DATA:  I have reviewed the data as listed    Component Value Date/Time   NA 131 (L) 12/13/2022 1050   K 4.2 12/13/2022  1050   CL 98 12/13/2022 1050   CO2 29 12/13/2022 1050   GLUCOSE 99 12/13/2022 1050   BUN 9 12/13/2022 1050   CREATININE 0.97 12/13/2022 1050   CALCIUM 9.3 12/13/2022 1050   PROT 7.2 12/13/2022 1050   ALBUMIN 4.2 12/13/2022 1050   AST 14 (L) 12/13/2022 1050   ALT 11 12/13/2022 1050   ALKPHOS 50 12/13/2022 1050   BILITOT 0.7 12/13/2022 1050   GFRNONAA >60 12/13/2022 1050   GFRAA >60 11/14/2017 0318    No results found for: "SPEP", "UPEP"  Lab Results  Component Value Date   WBC 4.4 12/13/2022   NEUTROABS 2.0 12/13/2022   HGB 12.8 (L) 12/13/2022   HCT 37.0 (L) 12/13/2022   MCV 94.4 12/13/2022   PLT 260 12/13/2022      Chemistry      Component Value Date/Time   NA 131 (L) 12/13/2022 1050   K 4.2 12/13/2022 1050   CL 98 12/13/2022 1050   CO2 29 12/13/2022 1050   BUN 9 12/13/2022 1050   CREATININE 0.97 12/13/2022 1050      Component Value Date/Time   CALCIUM 9.3 12/13/2022 1050   ALKPHOS 50 12/13/2022 1050   AST 14 (L) 12/13/2022 1050   ALT 11 12/13/2022 1050   BILITOT 0.7 12/13/2022 1050        RADIOGRAPHIC STUDIES: I have personally reviewed the radiological images as listed and agreed with the findings in the report. CT CHEST ABDOMEN PELVIS W CONTRAST  Result Date: 12/20/2022 CLINICAL DATA:  History of ureteral cancer, staging. * Tracking Code: BO * EXAM: CT CHEST, ABDOMEN, AND PELVIS WITH CONTRAST TECHNIQUE: Multidetector CT imaging of the chest, abdomen and pelvis was performed following the standard protocol during bolus administration of intravenous contrast. RADIATION DOSE REDUCTION: This exam was performed according to the departmental dose-optimization program which includes automated exposure control, adjustment of the mA and/or kV according to patient size and/or use of iterative reconstruction technique. CONTRAST:  OMNIPAQUE IOHEXOL 300 MG/ML  SOLN COMPARISON:  None Available. FINDINGS: CT CHEST FINDINGS Cardiovascular: Aortic atherosclerosis. No central pulmonary embolus on this nondedicated study. Calcifications of the mitral annulus. Mediastinum/Nodes: No pathologically enlarged mediastinal, hilar or axillary lymph nodes. Prominent mediastinal and axillary lymph nodes are stable dating back to May 22, 2019. The esophagus is grossly unremarkable. No suspicious thyroid nodule. Lungs/Pleura: A few scattered tiny pulmonary nodules for instance a 1-2 mm nodule in the left upper lobe on image 86/4 are stable from prior examination. No new suspicious pulmonary nodules or masses. Scattered atelectasis/scarring. Musculoskeletal: No aggressive lytic or blastic lesion of bone. Multilevel degenerative changes spine. CT ABDOMEN PELVIS FINDINGS Hepatobiliary: No suspicious hepatic lesion. Gallbladder is unremarkable. No biliary ductal dilation. Pancreas: No pancreatic ductal dilation or evidence of acute inflammation. Spleen: No splenomegaly. Adrenals/Urinary Tract: Status post right nephro ureterectomy. Small amount of nodular thickening along the right posterior aspect of the  urinary bladder wall for instance on image 112/2. No suspicious nodularity in the nephrectomy bed. 13 mm exophytic left renal cysts.  No left-sided hydronephrosis. Similar thickening without nodularity of the bilateral adrenal glands favored hyperplasia. Stomach/Bowel: Stomach is unremarkable for degree of distension. No pathologic dilation of small or large bowel. Colonic diverticulosis without findings of acute diverticulitis. Vascular/Lymphatic: Aortic atherosclerosis. Smooth IVC contours. The portal, splenic and superior mesenteric veins are patent. No pathologically enlarged abdominal or pelvic lymph nodes. Reproductive: Prostate is unremarkable. Other: Postsurgical change in the abdominal wall. Musculoskeletal: No aggressive lytic or blastic lesion  of bone. Multilevel degenerative changes spine. IMPRESSION: 1. Status post right nephroureterectomy with a small amount of nodular thickening along the right posterior aspect of the urinary bladder wall, possibly reflecting scarring/postsurgical change but would recommend correlation with cystoscopy. 2. No evidence of metastatic disease in the chest, abdomen or pelvis. 3.  Aortic Atherosclerosis (ICD10-I70.0). Electronically Signed   By: Maudry Mayhew M.D.   On: 12/20/2022 13:29

## 2022-12-20 ENCOUNTER — Ambulatory Visit (HOSPITAL_COMMUNITY)
Admission: RE | Admit: 2022-12-20 | Discharge: 2022-12-20 | Disposition: A | Discharge status: Home / Self Care | Payer: Medicare HMO | Source: Ambulatory Visit

## 2022-12-20 ENCOUNTER — Inpatient Hospital Stay (HOSPITAL_BASED_OUTPATIENT_CLINIC_OR_DEPARTMENT_OTHER): Payer: Medicare HMO

## 2022-12-20 DIAGNOSIS — K5730 Diverticulosis of large intestine without perforation or abscess without bleeding: Secondary | ICD-10-CM | POA: Diagnosis not present

## 2022-12-20 DIAGNOSIS — Z905 Acquired absence of kidney: Secondary | ICD-10-CM | POA: Insufficient documentation

## 2022-12-20 DIAGNOSIS — N281 Cyst of kidney, acquired: Secondary | ICD-10-CM | POA: Insufficient documentation

## 2022-12-20 DIAGNOSIS — C689 Malignant neoplasm of urinary organ, unspecified: Secondary | ICD-10-CM | POA: Diagnosis not present

## 2022-12-20 DIAGNOSIS — Z906 Acquired absence of other parts of urinary tract: Secondary | ICD-10-CM | POA: Diagnosis not present

## 2022-12-20 DIAGNOSIS — I700 Atherosclerosis of aorta: Secondary | ICD-10-CM | POA: Diagnosis not present

## 2022-12-20 MED ORDER — IOHEXOL 300 MG/ML  SOLN
100.0000 mL | Freq: Once | INTRAMUSCULAR | Status: AC | PRN
  Administered 2022-12-20: 100 mL via INTRAVENOUS

## 2023-01-01 ENCOUNTER — Other Ambulatory Visit: Payer: Self-pay | Admitting: *Deleted

## 2023-01-01 NOTE — Progress Notes (Signed)
Pharmacist Chemotherapy Monitoring - Initial Assessment    Anticipated start date: 01/08/23   The following has been reviewed per standard work regarding the patient's treatment regimen: The patient's diagnosis, treatment plan and drug doses, and organ/hematologic function Lab orders and baseline tests specific to treatment regimen  The treatment plan start date, drug sequencing, and pre-medications Prior authorization status  Patient's documented medication list, including drug-drug interaction screen and prescriptions for anti-emetics and supportive care specific to the treatment regimen The drug concentrations, fluid compatibility, administration routes, and timing of the medications to be used The patient's access for treatment and lifetime cumulative dose history, if applicable  The patient's medication allergies and previous infusion related reactions, if applicable   Changes made to treatment plan:  N/A  Follow up needed:  N/A   Billy Hernandez, RPH, 01/01/2023  2:46 PM

## 2023-01-02 ENCOUNTER — Inpatient Hospital Stay: Payer: Medicare HMO | Attending: Hematology and Oncology

## 2023-01-02 DIAGNOSIS — C661 Malignant neoplasm of right ureter: Secondary | ICD-10-CM | POA: Insufficient documentation

## 2023-01-02 DIAGNOSIS — C7989 Secondary malignant neoplasm of other specified sites: Secondary | ICD-10-CM | POA: Insufficient documentation

## 2023-01-02 DIAGNOSIS — Z5111 Encounter for antineoplastic chemotherapy: Secondary | ICD-10-CM | POA: Insufficient documentation

## 2023-01-04 ENCOUNTER — Inpatient Hospital Stay: Payer: Medicare HMO

## 2023-01-07 MED FILL — Dexamethasone Sodium Phosphate Inj 100 MG/10ML: INTRAMUSCULAR | Qty: 1 | Status: AC

## 2023-01-07 MED FILL — Fosaprepitant Dimeglumine For IV Infusion 150 MG (Base Eq): INTRAVENOUS | Qty: 5 | Status: AC

## 2023-01-07 NOTE — Progress Notes (Unsigned)
Patient Care Team: System, Provider Not In as PCP - General Melven Sartorius, MD as Consulting Physician (Hematology and Oncology)  Clinic Day:  01/08/2023  Referring physician: Melven Sartorius, MD  ASSESSMENT & PLAN:   Assessment & Plan:  67 y.o. 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  Diagnosis: pT2 cN0M0 High-grade urothelial carcinoma of distal ureter, size 1.8 and 2.7 cm  Past treatment: right laparoscopic nephroureterectomy  Current treatment: adjuvant therapy for [facility] after resection. Phase III POUT  Once every 3 weeks planned for 4 cycles. Gemcitabine days 1, 8 at 1000 mg/m2 Carboplatin day 1 AUC 4.5  [name] is feeling well.  Will proceed with first cycle of adjuvant therapy today.  He needs a jury duty excuse letter.  Letter given today.  No problem-specific Assessment & Plan notes found for this encounter.   The patient understands the plans discussed today and is in agreement with them.  He knows to contact our office if he develops concerns prior to his next appointment.  Melven Sartorius, MD  [facility] AT [facility] [address] [place] [address] Dept: [phone] Dept Fax: [phone]   No orders of the defined types were placed in this encounter.     CHIEF COMPLAINT:  CC: bladder cancer  Current Treatment:  adjuvant gemcitabine with carboplatin  INTERVAL HISTORY:  Billy Hernandez is here today for repeat clinical assessment. He denies fevers or chills. He denies pain. His appetite is good. No chest pain, coughing, short of breath or abdominal pain, no difficulty with urination.  I have reviewed the past medical history, past surgical history, social history and family history with the patient and they are unchanged from previous note.  ALLERGIES:  has No Known Allergies.  MEDICATIONS:  Current Outpatient  Medications  Medication Sig Dispense Refill   busPIRone (BUSPAR) 10 MG tablet Take 10 mg by mouth 2 (two) times daily.     dexamethasone (DECADRON) 4 MG tablet Take 1 tablets (4mg ) by mouth daily starting the day after carboplatin for 3 days. Take with food 30 tablet 1   lisinopril (ZESTRIL) 10 MG tablet Take 10 mg by mouth daily. Started on 09/30/2022     ondansetron (ZOFRAN) 8 MG tablet Take 1 tablet (8 mg total) by mouth every 8 (eight) hours as needed for nausea or vomiting. Start on the third day after carboplatin. 30 tablet 1   prochlorperazine (COMPAZINE) 10 MG tablet Take 1 tablet (10 mg total) by mouth every 6 (six) hours as needed for nausea or vomiting. 30 tablet 1   sildenafil (VIAGRA) 100 MG tablet Take 50 mg by mouth daily as needed for erectile dysfunction.     tamsulosin (FLOMAX) 0.4 MG CAPS capsule Take 0.4 mg by mouth daily.     traZODone (DESYREL) 50 MG tablet Take 50 mg by mouth at bedtime.     No current facility-administered medications for this visit.    HISTORY OF PRESENT ILLNESS:   Oncology History  Urothelial carcinoma (HCC)  12/2018 Miscellaneous   Initially presented with hematuria. 12/2018 CT with 3.5 cm mass in the posterior right bladder.    01/2019 Surgery   TURBT  with gemcitabine for 3.5cm right trigonal tumor. Path: HG papillary urothelial carcinoma.    04/2019 Procedure   Completed 6/6 BCG induction in  and completed 3 maintenance BCG cycles.    10/2020 Surgery  TURBT a small papillary lesion at the bladder neck/prostatic junction. Pathology: Noninvasive high grade papillary urothelial carcinoma. Did not undergo BCG following TURBT in 10/2020   09/25/2022 Procedure   Cystoscopy with tumor protruding from the right ureteral orifice with adjacent mass affect from the distal right ureter. No papillary lesions within the bladder itself.   10/24/2022 Surgery   TURBT  A. BLADDER LESION, RIGHT LATERAL WALL, BIOPSY:  Noninvasive high-grade urothelial  carcinoma with early papillary formation  Muscularis propria (detrusor muscle) is not present   B. URETERAL TUMOR, RIGHT, BIOPSY:  Noninvasive high-grade urothelial carcinoma    11/12/2022 Initial Diagnosis   Urothelial carcinoma (HCC)   11/12/2022 Surgery   Robotic assisted right laparoscopic nephroureterectomy    A. 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, size 1.8 and 2.7 cm  The carcinoma invades the muscularis (pT2)  Lymphovascular invasion is identified  Urothelial carcinoma in situ is present at renal pelvis  All margins of resection are negative for carcinoma   RENAL PELVIS AND URETER:   Procedure: Nephroureterectomy  Specimen Laterality: Right  Tumor Site: Distal ureter  Histologic Type: Urothelial carcinoma  Histologic Grade: High grade  Tumor Extension: Muscularis  Lymphovascular Invasion: Identified  Margin status for invasive carcinoma: Negative  Margin status for carcinoma in situ/ Noninvasive papillary urothelial  carcinoma: Negative  Regional Lymph Nodes:          No Lymph Nodes Submitted or found: X          Number of Lymph Nodes Involved: NA          Size of Largest Nodal Metastatic Deposit/ specify site: NA          Number of Lymph Nodes Examined: NA  Distant Metastasis: NA  Pathologic Stage Classification (pTNM, AJCC 8th Edition): pT2, pN  Pathologic Findings in Ipsilateral Nonneoplastic Renal Tissue: chronic  obstructive changes with prominent pyelonephritis    12/10/2022 Cancer Staging   Staging form: Renal Pelvis and Ureter, AJCC 8th Edition - Clinical: Stage Unknown (cT2, cNX, cM0) - Signed by Melven Sartorius, MD on 12/10/2022 Stage prefix: Initial diagnosis WHO/ISUP grade (low/high): High Grade Histologic grading system: 2 grade system   01/08/2023 -  Chemotherapy   Patient is on Treatment Plan : BLADDER Carboplatin D1 + Gemcitabine D1,8 q21d         REVIEW OF SYSTEMS:   All relevant  systems were reviewed with the patient and are negative.   VITALS:  Blood pressure (!) 169/88, pulse 70, temperature (!) 97.3 F (36.3 C), temperature source Tympanic, resp. rate 16, height 6' (1.829 m), weight 174 lb (78.9 kg), SpO2 96%.  Wt Readings from Last 3 Encounters:  01/08/23 174 lb (78.9 kg)  12/13/22 179 lb 3.2 oz (81.3 kg)  11/13/22 179 lb 15.4 oz (81.6 kg)    Body mass index is 23.6 kg/m.  Performance status (ECOG): 0 - Asymptomatic  PHYSICAL EXAM:   GENERAL: alert, no distress and comfortable SKIN: skin color normal EYES: normal, sclera clear LUNGS: clear to auscultation with normal breathing effort.  No wheeze or rales HEART: regular rate & rhythm and no murmurs and no lower extremity edema ABDOMEN: abdomen soft, non-tender and nondistended Musculoskeletal: no edema   LABORATORY DATA:  I have reviewed the data as listed    Component Value Date/Time   NA 131 (L) 12/13/2022 1050   K 4.2 12/13/2022 1050   CL 98 12/13/2022 1050   CO2 29 12/13/2022 1050  GLUCOSE 99 12/13/2022 1050   BUN 9 12/13/2022 1050   CREATININE 0.97 12/13/2022 1050   CALCIUM 9.3 12/13/2022 1050   PROT 7.2 12/13/2022 1050   ALBUMIN 4.2 12/13/2022 1050   AST 14 (L) 12/13/2022 1050   ALT 11 12/13/2022 1050   ALKPHOS 50 12/13/2022 1050   BILITOT 0.7 12/13/2022 1050   GFRNONAA >60 12/13/2022 1050   GFRAA >60 11/14/2017 0318    No results found for: "SPEP", "UPEP"  Lab Results  Component Value Date   WBC 5.0 01/08/2023   NEUTROABS 2.9 01/08/2023   HGB 13.1 01/08/2023   HCT 36.6 (L) 01/08/2023   MCV 92.0 01/08/2023   PLT 265 01/08/2023      Chemistry      Component Value Date/Time   NA 131 (L) 12/13/2022 1050   K 4.2 12/13/2022 1050   CL 98 12/13/2022 1050   CO2 29 12/13/2022 1050   BUN 9 12/13/2022 1050   CREATININE 0.97 12/13/2022 1050      Component Value Date/Time   CALCIUM 9.3 12/13/2022 1050   ALKPHOS 50 12/13/2022 1050   AST 14 (L) 12/13/2022 1050   ALT 11  12/13/2022 1050   BILITOT 0.7 12/13/2022 1050       RADIOGRAPHIC STUDIES: I have personally reviewed the radiological images as listed and agreed with the findings in the report. CT CHEST ABDOMEN PELVIS W CONTRAST  Result Date: 12/20/2022 CLINICAL DATA:  History of ureteral cancer, staging. * Tracking Code: BO * EXAM: CT CHEST, ABDOMEN, AND PELVIS WITH CONTRAST TECHNIQUE: Multidetector CT imaging of the chest, abdomen and pelvis was performed following the standard protocol during bolus administration of intravenous contrast. RADIATION DOSE REDUCTION: This exam was performed according to the departmental dose-optimization program which includes automated exposure control, adjustment of the mA and/or kV according to patient size and/or use of iterative reconstruction technique. CONTRAST:  OMNIPAQUE IOHEXOL 300 MG/ML  SOLN COMPARISON:  None Available. FINDINGS: CT CHEST FINDINGS Cardiovascular: Aortic atherosclerosis. No central pulmonary embolus on this nondedicated study. Calcifications of the mitral annulus. Mediastinum/Nodes: No pathologically enlarged mediastinal, hilar or axillary lymph nodes. Prominent mediastinal and axillary lymph nodes are stable dating back to May 22, 2019. The esophagus is grossly unremarkable. No suspicious thyroid nodule. Lungs/Pleura: A few scattered tiny pulmonary nodules for instance a 1-2 mm nodule in the left upper lobe on image 86/4 are stable from prior examination. No new suspicious pulmonary nodules or masses. Scattered atelectasis/scarring. Musculoskeletal: No aggressive lytic or blastic lesion of bone. Multilevel degenerative changes spine. CT ABDOMEN PELVIS FINDINGS Hepatobiliary: No suspicious hepatic lesion. Gallbladder is unremarkable. No biliary ductal dilation. Pancreas: No pancreatic ductal dilation or evidence of acute inflammation. Spleen: No splenomegaly. Adrenals/Urinary Tract: Status post right nephro ureterectomy. Small amount of nodular  thickening along the right posterior aspect of the urinary bladder wall for instance on image 112/2. No suspicious nodularity in the nephrectomy bed. 13 mm exophytic left renal cysts.  No left-sided hydronephrosis. Similar thickening without nodularity of the bilateral adrenal glands favored hyperplasia. Stomach/Bowel: Stomach is unremarkable for degree of distension. No pathologic dilation of small or large bowel. Colonic diverticulosis without findings of acute diverticulitis. Vascular/Lymphatic: Aortic atherosclerosis. Smooth IVC contours. The portal, splenic and superior mesenteric veins are patent. No pathologically enlarged abdominal or pelvic lymph nodes. Reproductive: Prostate is unremarkable. Other: Postsurgical change in the abdominal wall. Musculoskeletal: No aggressive lytic or blastic lesion of bone. Multilevel degenerative changes spine. IMPRESSION: 1. Status post right nephroureterectomy with a small amount  of nodular thickening along the right posterior aspect of the urinary bladder wall, possibly reflecting scarring/postsurgical change but would recommend correlation with cystoscopy. 2. No evidence of metastatic disease in the chest, abdomen or pelvis. 3.  Aortic Atherosclerosis (ICD10-I70.0). Electronically Signed   By: Maudry Mayhew M.D.   On: 12/20/2022 13:29

## 2023-01-08 ENCOUNTER — Inpatient Hospital Stay: Payer: Medicare HMO

## 2023-01-08 DIAGNOSIS — Z5111 Encounter for antineoplastic chemotherapy: Secondary | ICD-10-CM | POA: Diagnosis present

## 2023-01-08 DIAGNOSIS — C689 Malignant neoplasm of urinary organ, unspecified: Secondary | ICD-10-CM | POA: Diagnosis not present

## 2023-01-08 DIAGNOSIS — C661 Malignant neoplasm of right ureter: Secondary | ICD-10-CM | POA: Diagnosis not present

## 2023-01-08 DIAGNOSIS — C7989 Secondary malignant neoplasm of other specified sites: Secondary | ICD-10-CM | POA: Diagnosis not present

## 2023-01-08 LAB — CBC WITH DIFFERENTIAL (CANCER CENTER ONLY)
Abs Immature Granulocytes: 0.01 10*3/uL (ref 0.00–0.07)
Basophils Absolute: 0.1 10*3/uL (ref 0.0–0.1)
Basophils Relative: 1 %
Eosinophils Absolute: 0.2 10*3/uL (ref 0.0–0.5)
Eosinophils Relative: 4 %
HCT: 36.6 % — ABNORMAL LOW (ref 39.0–52.0)
Hemoglobin: 13.1 g/dL (ref 13.0–17.0)
Immature Granulocytes: 0 %
Lymphocytes Relative: 22 %
Lymphs Abs: 1.1 10*3/uL (ref 0.7–4.0)
MCH: 32.9 pg (ref 26.0–34.0)
MCHC: 35.8 g/dL (ref 30.0–36.0)
MCV: 92 fL (ref 80.0–100.0)
Monocytes Absolute: 0.7 10*3/uL (ref 0.1–1.0)
Monocytes Relative: 14 %
Neutro Abs: 2.9 10*3/uL (ref 1.7–7.7)
Neutrophils Relative %: 59 %
Platelet Count: 265 10*3/uL (ref 150–400)
RBC: 3.98 MIL/uL — ABNORMAL LOW (ref 4.22–5.81)
RDW: 12 % (ref 11.5–15.5)
WBC Count: 5 10*3/uL (ref 4.0–10.5)
nRBC: 0 % (ref 0.0–0.2)

## 2023-01-08 LAB — CMP (CANCER CENTER ONLY)
ALT: 11 U/L (ref 0–44)
AST: 15 U/L (ref 15–41)
Albumin: 4.3 g/dL (ref 3.5–5.0)
Alkaline Phosphatase: 47 U/L (ref 38–126)
Anion gap: 6 (ref 5–15)
BUN: 10 mg/dL (ref 8–23)
CO2: 26 mmol/L (ref 22–32)
Calcium: 9.4 mg/dL (ref 8.9–10.3)
Chloride: 99 mmol/L (ref 98–111)
Creatinine: 1.1 mg/dL (ref 0.61–1.24)
GFR, Estimated: >60 mL/min (ref >60)
Glucose, Bld: 122 mg/dL — ABNORMAL HIGH (ref 70–99)
Potassium: 4.5 mmol/L (ref 3.5–5.1)
Sodium: 131 mmol/L — ABNORMAL LOW (ref 135–145)
Total Bilirubin: 0.7 mg/dL (ref 0.3–1.2)
Total Protein: 7.2 g/dL (ref 6.5–8.1)

## 2023-01-08 MED ORDER — SODIUM CHLORIDE 0.9 % IV SOLN: Freq: Once | INTRAVENOUS | Status: AC

## 2023-01-08 MED ORDER — SODIUM CHLORIDE 0.9 % IV SOLN
440.5500 mg | Freq: Once | INTRAVENOUS | Status: AC
  Administered 2023-01-08: 440 mg via INTRAVENOUS
  Filled 2023-01-08: qty 44

## 2023-01-08 MED ORDER — SODIUM CHLORIDE 0.9 % IV SOLN
1000.0000 mg/m2 | Freq: Once | INTRAVENOUS | Status: AC
  Administered 2023-01-08: 2014 mg via INTRAVENOUS
  Filled 2023-01-08: qty 52.97

## 2023-01-08 MED ORDER — PALONOSETRON HCL INJECTION 0.25 MG/5ML
0.2500 mg | Freq: Once | INTRAVENOUS | Status: AC
  Administered 2023-01-08: 0.25 mg via INTRAVENOUS
  Filled 2023-01-08: qty 5

## 2023-01-08 MED ORDER — SODIUM CHLORIDE 0.9 % IV SOLN
10.0000 mg | Freq: Once | INTRAVENOUS | Status: AC
  Administered 2023-01-08: 10 mg via INTRAVENOUS
  Filled 2023-01-08: qty 10

## 2023-01-08 MED ORDER — SODIUM CHLORIDE 0.9 % IV SOLN
150.0000 mg | Freq: Once | INTRAVENOUS | Status: AC
  Administered 2023-01-08: 150 mg via INTRAVENOUS
  Filled 2023-01-08: qty 150

## 2023-01-08 NOTE — Assessment & Plan Note (Signed)
Patient will proceed with cycle 1 today. Return in one week with me

## 2023-01-08 NOTE — Patient Instructions (Signed)
[place] [facility]  Discharge Instructions: Thank you for choosing [facility] to provide your oncology and hematology care.   If you have a lab appointment with the Cancer Center, please go directly to the Cancer Center and check in at the registration area.   Wear comfortable clothing and clothing appropriate for easy access to any Portacath or PICC line.   We strive to give you quality time with your provider. You may need to reschedule your appointment if you arrive late (15 or more minutes).  Arriving late affects you and other patients whose appointments are after yours.  Also, if you miss three or more appointments without notifying the office, you may be dismissed from the clinic at the provider's discretion.      For prescription refill requests, have your pharmacy contact our office and allow 72 hours for refills to be completed.    Today you received the following chemotherapy and/or immunotherapy agents: Gemcitabine / Carboplatin      To help prevent nausea and vomiting after your treatment, we encourage you to take your nausea medication as directed.  BELOW ARE SYMPTOMS THAT SHOULD BE REPORTED IMMEDIATELY: *FEVER GREATER THAN 100.4 F (38 C) OR HIGHER *CHILLS OR SWEATING *NAUSEA AND VOMITING THAT IS NOT CONTROLLED WITH YOUR NAUSEA MEDICATION *UNUSUAL SHORTNESS OF BREATH *UNUSUAL BRUISING OR BLEEDING *URINARY PROBLEMS (pain or burning when urinating, or frequent urination) *BOWEL PROBLEMS (unusual diarrhea, constipation, pain near the anus) TENDERNESS IN MOUTH AND THROAT WITH OR WITHOUT PRESENCE OF ULCERS (sore throat, sores in mouth, or a toothache) UNUSUAL RASH, SWELLING OR PAIN  UNUSUAL VAGINAL DISCHARGE OR ITCHING   Items with * indicate a potential emergency and should be followed up as soon as possible or go to the Emergency Department if any problems should occur.  Please show the CHEMOTHERAPY ALERT CARD or IMMUNOTHERAPY  ALERT CARD at check-in to the Emergency Department and triage nurse.  Should you have questions after your visit or need to cancel or reschedule your appointment, please contact [facility]  Dept: [phone]  and follow the prompts.  Office hours are 8:00 a.m. to 4:30 p.m. Monday - Friday. Please note that voicemails left after 4:00 p.m. may not be returned until the following business day.  We are closed weekends and major holidays. You have access to a nurse at all times for urgent questions. Please call the main number to the clinic Dept: [phone] and follow the prompts.   For any non-urgent questions, you may also contact your provider using MyChart. We now offer e-Visits for anyone 56 and older to request care online for non-urgent symptoms. For details visit [email or web address].   Also download the MyChart app! Go to the app store, search "MyChart", open the app, select [facility], and log in with your MyChart username and password.

## 2023-01-09 ENCOUNTER — Telehealth: Payer: Self-pay

## 2023-01-09 NOTE — Telephone Encounter (Signed)
-----   Message from Nurse Cortney P sent at 01/08/2023  2:37 PM EDT ----- Regarding: First time Gemzar / Carbo Dr. Cherly Hensen patient first time Gemzar / Carbo. Pt tolerated treatment well, no complaints

## 2023-01-09 NOTE — Telephone Encounter (Signed)
Billy Hernandez states that he is doing fine. He is eating, drinking, and urinating well. He knows to call the office at [phone] if he has any questions or concerns.

## 2023-01-14 NOTE — Progress Notes (Unsigned)
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 Phase III POUT started on 01/08/23. Once every 3 weeks planned for 4 cycles. Gemcitabine days 1, 8 at 1000 mg/m2 Carboplatin day 1 AUC 4.5  Patient has profound neutropenia after day 1.  Currently asymptomatic.  Discussed precautions.  Postpone treatment for 1 week.  Urothelial carcinoma (HCC) Received C1D1 carbo/gem Day 8 postpone 1 week due to low ANC  Neutropenia (HCC) Postpone treatment for 1 week Repeat lab next week, follow-up with me before treatment Discussed neutropenic precautions.  Avoid constipation.  Educated on management of constipation.    The patient understands the plans discussed today and is in agreement with them.  He knows to contact our office if he develops concerns prior to his next appointment.   Melven Sartorius, MD  [facility] AT [facility] [address] [place] [address] Dept: [phone] Dept Fax: [phone]   Orders Placed This Encounter  Procedures   CBC with Differential (Cancer Center Only)    Standing Status:   Future    Standing Expiration Date:   01/22/2024   CMP (Cancer Center only)    Standing Status:   Future    Standing Expiration Date:   01/22/2024   CBC with Differential (Cancer Center Only)    Standing Status:   Future    Standing Expiration Date:   02/26/2024   CMP (Cancer Center only)    Standing Status:   Future    Standing Expiration Date:   02/26/2024   CBC with Differential (Cancer Center Only)    Standing  Status:   Future    Standing Expiration Date:   03/04/2024   CMP (Cancer Center only)    Standing Status:   Future    Standing Expiration Date:   03/04/2024      CHIEF COMPLAINT:  CC: Chemotherapy follow-up  Current Treatment: Gemcitabine and carboplatin  INTERVAL HISTORY:  Billy Hernandez is here today for repeat clinical assessment. He denies fevers or chills. He denies pain. His appetite is good.  Rest of review of system was negative as below.  I have reviewed the past medical history, past surgical history, social history and family history with the patient and they are unchanged from previous note.  ALLERGIES:  has No Known Allergies.  MEDICATIONS:  Current Outpatient Medications  Medication Sig Dispense Refill   busPIRone (BUSPAR) 10 MG tablet Take 10 mg by mouth 2 (two) times daily.     dexamethasone (DECADRON) 4 MG tablet Take 1 tablets (4mg ) by mouth daily starting the day after carboplatin for 3 days. Take with food 30 tablet 1   lisinopril (ZESTRIL) 10 MG tablet Take 10 mg by mouth daily. Started on 09/30/2022     ondansetron (ZOFRAN) 8 MG tablet Take 1 tablet (8 mg total) by mouth every 8 (eight) hours as needed for nausea or vomiting. Start on the third day after carboplatin. 30 tablet 1   prochlorperazine (COMPAZINE) 10 MG tablet Take 1 tablet (10 mg total) by mouth every 6 (six) hours as needed for nausea or vomiting. 30 tablet  1   sildenafil (VIAGRA) 100 MG tablet Take 50 mg by mouth daily as needed for erectile dysfunction.     tamsulosin (FLOMAX) 0.4 MG CAPS capsule Take 0.4 mg by mouth daily.     traZODone (DESYREL) 50 MG tablet Take 50 mg by mouth at bedtime.     No current facility-administered medications for this visit.    HISTORY OF PRESENT ILLNESS:   Oncology History  Urothelial carcinoma (HCC)  12/2018 Miscellaneous   Initially presented with hematuria. 12/2018 CT with 3.5 cm mass in the posterior right bladder.    01/2019 Surgery   TURBT  with gemcitabine  for 3.5cm right trigonal tumor. Path: HG papillary urothelial carcinoma.    04/2019 Procedure   Completed 6/6 BCG induction in  and completed 3 maintenance BCG cycles.    10/2020 Surgery   TURBT a small papillary lesion at the bladder neck/prostatic junction. Pathology: Noninvasive high grade papillary urothelial carcinoma. Did not undergo BCG following TURBT in 10/2020   09/25/2022 Procedure   Cystoscopy with tumor protruding from the right ureteral orifice with adjacent mass affect from the distal right ureter. No papillary lesions within the bladder itself.   10/24/2022 Surgery   TURBT  A. BLADDER LESION, RIGHT LATERAL WALL, BIOPSY:  Noninvasive high-grade urothelial carcinoma with early papillary formation  Muscularis propria (detrusor muscle) is not present   B. URETERAL TUMOR, RIGHT, BIOPSY:  Noninvasive high-grade urothelial carcinoma    11/12/2022 Initial Diagnosis   Urothelial carcinoma (HCC)   11/12/2022 Surgery   Robotic assisted right laparoscopic nephroureterectomy    A. 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, size 1.8 and 2.7 cm  The carcinoma invades the muscularis (pT2)  Lymphovascular invasion is identified  Urothelial carcinoma in situ is present at renal pelvis  All margins of resection are negative for carcinoma   RENAL PELVIS AND URETER:   Procedure: Nephroureterectomy  Specimen Laterality: Right  Tumor Site: Distal ureter  Histologic Type: Urothelial carcinoma  Histologic Grade: High grade  Tumor Extension: Muscularis  Lymphovascular Invasion: Identified  Margin status for invasive carcinoma: Negative  Margin status for carcinoma in situ/ Noninvasive papillary urothelial  carcinoma: Negative  Regional Lymph Nodes:          No Lymph Nodes Submitted or found: X          Number of Lymph Nodes Involved: NA          Size of Largest Nodal Metastatic Deposit/ specify site: NA          Number of Lymph Nodes  Examined: NA  Distant Metastasis: NA  Pathologic Stage Classification (pTNM, AJCC 8th Edition): pT2, pN  Pathologic Findings in Ipsilateral Nonneoplastic Renal Tissue: chronic  obstructive changes with prominent pyelonephritis    12/10/2022 Cancer Staging   Staging form: Renal Pelvis and Ureter, AJCC 8th Edition - Clinical: Stage Unknown (cT2, cNX, cM0) - Signed by Melven Sartorius, MD on 12/10/2022 Stage prefix: Initial diagnosis WHO/ISUP grade (low/high): High Grade Histologic grading system: 2 grade system   01/08/2023 -  Chemotherapy   Patient is on Treatment Plan : BLADDER Carboplatin D1 + Gemcitabine D1,8 q21d     01/08/2023 -  Chemotherapy   Patient is on Treatment Plan :  BLADDER Carboplatin D1 + Gemcitabine D1,8 q21d         REVIEW OF SYSTEMS:   Constitutional: Denies fevers, chills or abnormal weight loss Eyes: Denies blurriness of vision Ears, nose, mouth, throat, and face:  Denies mucositis or sore throat Respiratory: Denies cough, dyspnea or wheezes Cardiovascular: Denies palpitation, chest discomfort or lower extremity swelling Gastrointestinal:  Denies nausea, heartburn or change in bowel habits Skin: Denies abnormal skin rashes Lymphatics: Denies new lymphadenopathy or easy bruising Neurological:Denies numbness, tingling or new weaknesses Behavioral/Psych: Mood is stable, no new changes  All other systems were reviewed with the patient and are negative.   VITALS:  Blood pressure (!) 142/73, pulse 76, temperature 98.8 F (37.1 C), temperature source Oral, resp. rate 14, height 6' (1.829 m), weight 180 lb 1.6 oz (81.7 kg), SpO2 99%.  Wt Readings from Last 3 Encounters:  01/15/23 180 lb 1.6 oz (81.7 kg)  01/08/23 174 lb (78.9 kg)  12/13/22 179 lb 3.2 oz (81.3 kg)    Body mass index is 24.43 kg/m.  Performance status (ECOG): 0 - Asymptomatic  PHYSICAL EXAM:   GENERAL:alert, no distress and comfortable SKIN: skin color normal, no rashes  EYES: normal,  sclera clear OROPHARYNX: no exudate, no erythema    LUNGS: clear to auscultation with normal breathing effort.  No wheeze or rales HEART: regular rate & rhythm and no murmurs and no lower extremity edema ABDOMEN: abdomen soft, non-tender and nondistended Musculoskeletal: no edema NEURO: alert  LABORATORY DATA:  I have reviewed the data as listed    Component Value Date/Time   NA 131 (L) 01/08/2023 0922   K 4.5 01/08/2023 0922   CL 99 01/08/2023 0922   CO2 26 01/08/2023 0922   GLUCOSE 122 (H) 01/08/2023 0922   BUN 10 01/08/2023 0922   CREATININE 1.10 01/08/2023 0922   CALCIUM 9.4 01/08/2023 0922   PROT 7.2 01/08/2023 0922   ALBUMIN 4.3 01/08/2023 0922   AST 15 01/08/2023 0922   ALT 11 01/08/2023 0922   ALKPHOS 47 01/08/2023 0922   BILITOT 0.7 01/08/2023 0922   GFRNONAA >60 01/08/2023 0922   GFRAA >60 11/14/2017 0318    No results found for: "SPEP", "UPEP"  Lab Results  Component Value Date   WBC 1.5 (L) 01/15/2023   NEUTROABS 0.6 (L) 01/15/2023   HGB 11.9 (L) 01/15/2023   HCT 33.4 (L) 01/15/2023   MCV 91.3 01/15/2023   PLT 155 01/15/2023      Chemistry      Component Value Date/Time   NA 131 (L) 01/08/2023 0922   K 4.5 01/08/2023 0922   CL 99 01/08/2023 0922   CO2 26 01/08/2023 0922   BUN 10 01/08/2023 0922   CREATININE 1.10 01/08/2023 0922      Component Value Date/Time   CALCIUM 9.4 01/08/2023 0922   ALKPHOS 47 01/08/2023 0922   AST 15 01/08/2023 0922   ALT 11 01/08/2023 0922   BILITOT 0.7 01/08/2023 0922       RADIOGRAPHIC STUDIES: I have personally reviewed the radiological images as listed and agreed with the findings in the report. CT CHEST ABDOMEN PELVIS W CONTRAST  Result Date: 12/20/2022 CLINICAL DATA:  History of ureteral cancer, staging. * Tracking Code: BO * EXAM: CT CHEST, ABDOMEN, AND PELVIS WITH CONTRAST TECHNIQUE: Multidetector CT imaging of the chest, abdomen and pelvis was performed following the standard protocol during bolus  administration of intravenous contrast. RADIATION DOSE REDUCTION: This exam was performed according to the departmental dose-optimization program which includes automated exposure control, adjustment of the mA and/or kV according to patient size and/or use of iterative reconstruction technique. CONTRAST:  OMNIPAQUE IOHEXOL 300 MG/ML  SOLN COMPARISON:  None Available. FINDINGS: CT CHEST FINDINGS Cardiovascular: Aortic atherosclerosis.  No central pulmonary embolus on this nondedicated study. Calcifications of the mitral annulus. Mediastinum/Nodes: No pathologically enlarged mediastinal, hilar or axillary lymph nodes. Prominent mediastinal and axillary lymph nodes are stable dating back to May 22, 2019. The esophagus is grossly unremarkable. No suspicious thyroid nodule. Lungs/Pleura: A few scattered tiny pulmonary nodules for instance a 1-2 mm nodule in the left upper lobe on image 86/4 are stable from prior examination. No new suspicious pulmonary nodules or masses. Scattered atelectasis/scarring. Musculoskeletal: No aggressive lytic or blastic lesion of bone. Multilevel degenerative changes spine. CT ABDOMEN PELVIS FINDINGS Hepatobiliary: No suspicious hepatic lesion. Gallbladder is unremarkable. No biliary ductal dilation. Pancreas: No pancreatic ductal dilation or evidence of acute inflammation. Spleen: No splenomegaly. Adrenals/Urinary Tract: Status post right nephro ureterectomy. Small amount of nodular thickening along the right posterior aspect of the urinary bladder wall for instance on image 112/2. No suspicious nodularity in the nephrectomy bed. 13 mm exophytic left renal cysts.  No left-sided hydronephrosis. Similar thickening without nodularity of the bilateral adrenal glands favored hyperplasia. Stomach/Bowel: Stomach is unremarkable for degree of distension. No pathologic dilation of small or large bowel. Colonic diverticulosis without findings of acute diverticulitis. Vascular/Lymphatic:  Aortic atherosclerosis. Smooth IVC contours. The portal, splenic and superior mesenteric veins are patent. No pathologically enlarged abdominal or pelvic lymph nodes. Reproductive: Prostate is unremarkable. Other: Postsurgical change in the abdominal wall. Musculoskeletal: No aggressive lytic or blastic lesion of bone. Multilevel degenerative changes spine. IMPRESSION: 1. Status post right nephroureterectomy with a small amount of nodular thickening along the right posterior aspect of the urinary bladder wall, possibly reflecting scarring/postsurgical change but would recommend correlation with cystoscopy. 2. No evidence of metastatic disease in the chest, abdomen or pelvis. 3.  Aortic Atherosclerosis (ICD10-I70.0). Electronically Signed   By: Maudry Mayhew M.D.   On: 12/20/2022 13:29

## 2023-01-14 NOTE — Assessment & Plan Note (Addendum)
Received C1D1 carbo/gem Day 8 tomorrow

## 2023-01-15 ENCOUNTER — Inpatient Hospital Stay: Payer: Medicare HMO

## 2023-01-15 ENCOUNTER — Other Ambulatory Visit: Payer: Self-pay

## 2023-01-15 VITALS — BP 142/73 | HR 76 | Temp 98.8°F | Resp 14 | Ht 72.0 in | Wt 180.1 lb

## 2023-01-15 DIAGNOSIS — C689 Malignant neoplasm of urinary organ, unspecified: Secondary | ICD-10-CM | POA: Diagnosis not present

## 2023-01-15 DIAGNOSIS — T451X5A Adverse effect of antineoplastic and immunosuppressive drugs, initial encounter: Secondary | ICD-10-CM

## 2023-01-15 DIAGNOSIS — D709 Neutropenia, unspecified: Secondary | ICD-10-CM | POA: Insufficient documentation

## 2023-01-15 DIAGNOSIS — Z5111 Encounter for antineoplastic chemotherapy: Secondary | ICD-10-CM | POA: Diagnosis not present

## 2023-01-15 DIAGNOSIS — D701 Agranulocytosis secondary to cancer chemotherapy: Secondary | ICD-10-CM

## 2023-01-15 LAB — CBC WITH DIFFERENTIAL (CANCER CENTER ONLY)
Abs Immature Granulocytes: 0 10*3/uL (ref 0.00–0.07)
Basophils Absolute: 0 10*3/uL (ref 0.0–0.1)
Basophils Relative: 1 %
Eosinophils Absolute: 0.1 10*3/uL (ref 0.0–0.5)
Eosinophils Relative: 9 %
HCT: 33.4 % — ABNORMAL LOW (ref 39.0–52.0)
Hemoglobin: 11.9 g/dL — ABNORMAL LOW (ref 13.0–17.0)
Immature Granulocytes: 0 %
Lymphocytes Relative: 45 %
Lymphs Abs: 0.7 10*3/uL (ref 0.7–4.0)
MCH: 32.5 pg (ref 26.0–34.0)
MCHC: 35.6 g/dL (ref 30.0–36.0)
MCV: 91.3 fL (ref 80.0–100.0)
Monocytes Absolute: 0.1 10*3/uL (ref 0.1–1.0)
Monocytes Relative: 5 %
Neutro Abs: 0.6 10*3/uL — ABNORMAL LOW (ref 1.7–7.7)
Neutrophils Relative %: 40 %
Platelet Count: 155 10*3/uL (ref 150–400)
RBC: 3.66 MIL/uL — ABNORMAL LOW (ref 4.22–5.81)
RDW: 11.7 % (ref 11.5–15.5)
WBC Count: 1.5 10*3/uL — ABNORMAL LOW (ref 4.0–10.5)
nRBC: 0 % (ref 0.0–0.2)

## 2023-01-15 LAB — CMP (CANCER CENTER ONLY)
ALT: 16 U/L (ref 0–44)
AST: 16 U/L (ref 15–41)
Albumin: 4 g/dL (ref 3.5–5.0)
Alkaline Phosphatase: 49 U/L (ref 38–126)
Anion gap: 6 (ref 5–15)
BUN: 13 mg/dL (ref 8–23)
CO2: 26 mmol/L (ref 22–32)
Calcium: 9 mg/dL (ref 8.9–10.3)
Chloride: 97 mmol/L — ABNORMAL LOW (ref 98–111)
Creatinine: 0.97 mg/dL (ref 0.61–1.24)
GFR, Estimated: >60 mL/min (ref >60)
Glucose, Bld: 123 mg/dL — ABNORMAL HIGH (ref 70–99)
Potassium: 4.3 mmol/L (ref 3.5–5.1)
Sodium: 129 mmol/L — ABNORMAL LOW (ref 135–145)
Total Bilirubin: 0.3 mg/dL (ref 0.3–1.2)
Total Protein: 6.8 g/dL (ref 6.5–8.1)

## 2023-01-15 NOTE — Assessment & Plan Note (Addendum)
Postpone treatment for 1 week Repeat lab next week, follow-up with me before treatment Discussed neutropenic precautions.  Avoid constipation.  Educated on management of constipation. If developing signs of infection, fever, report to ED immediately

## 2023-01-16 ENCOUNTER — Telehealth: Payer: Self-pay

## 2023-01-16 NOTE — Telephone Encounter (Signed)
Per [facility] LOS on 10/22 patient is aware of scheduled appointment times/dates

## 2023-01-17 ENCOUNTER — Other Ambulatory Visit: Payer: Self-pay

## 2023-01-21 ENCOUNTER — Inpatient Hospital Stay (HOSPITAL_BASED_OUTPATIENT_CLINIC_OR_DEPARTMENT_OTHER): Payer: Medicare HMO

## 2023-01-21 ENCOUNTER — Inpatient Hospital Stay: Payer: Medicare HMO

## 2023-01-21 VITALS — BP 143/75 | HR 73 | Temp 98.1°F | Resp 20 | Wt 190.6 lb

## 2023-01-21 DIAGNOSIS — Z5111 Encounter for antineoplastic chemotherapy: Secondary | ICD-10-CM | POA: Diagnosis not present

## 2023-01-21 DIAGNOSIS — C689 Malignant neoplasm of urinary organ, unspecified: Secondary | ICD-10-CM | POA: Diagnosis not present

## 2023-01-21 DIAGNOSIS — Z9189 Other specified personal risk factors, not elsewhere classified: Secondary | ICD-10-CM | POA: Diagnosis not present

## 2023-01-21 LAB — CMP (CANCER CENTER ONLY)
ALT: 16 U/L (ref 0–44)
AST: 17 U/L (ref 15–41)
Albumin: 4.3 g/dL (ref 3.5–5.0)
Alkaline Phosphatase: 55 U/L (ref 38–126)
Anion gap: 7 (ref 5–15)
BUN: 12 mg/dL (ref 8–23)
CO2: 27 mmol/L (ref 22–32)
Calcium: 9.2 mg/dL (ref 8.9–10.3)
Chloride: 95 mmol/L — ABNORMAL LOW (ref 98–111)
Creatinine: 0.95 mg/dL (ref 0.61–1.24)
GFR, Estimated: >60 mL/min (ref >60)
Glucose, Bld: 67 mg/dL — ABNORMAL LOW (ref 70–99)
Potassium: 3.9 mmol/L (ref 3.5–5.1)
Sodium: 129 mmol/L — ABNORMAL LOW (ref 135–145)
Total Bilirubin: 0.4 mg/dL (ref 0.3–1.2)
Total Protein: 7.2 g/dL (ref 6.5–8.1)

## 2023-01-21 LAB — CBC WITH DIFFERENTIAL (CANCER CENTER ONLY)
Abs Immature Granulocytes: 0.01 10*3/uL (ref 0.00–0.07)
Basophils Absolute: 0 10*3/uL (ref 0.0–0.1)
Basophils Relative: 0 %
Eosinophils Absolute: 0.1 10*3/uL (ref 0.0–0.5)
Eosinophils Relative: 2 %
HCT: 33.4 % — ABNORMAL LOW (ref 39.0–52.0)
Hemoglobin: 11.9 g/dL — ABNORMAL LOW (ref 13.0–17.0)
Immature Granulocytes: 0 %
Lymphocytes Relative: 23 %
Lymphs Abs: 1.1 10*3/uL (ref 0.7–4.0)
MCH: 32.7 pg (ref 26.0–34.0)
MCHC: 35.6 g/dL (ref 30.0–36.0)
MCV: 91.8 fL (ref 80.0–100.0)
Monocytes Absolute: 0.6 10*3/uL (ref 0.1–1.0)
Monocytes Relative: 12 %
Neutro Abs: 3.1 10*3/uL (ref 1.7–7.7)
Neutrophils Relative %: 63 %
Platelet Count: 178 10*3/uL (ref 150–400)
RBC: 3.64 MIL/uL — ABNORMAL LOW (ref 4.22–5.81)
RDW: 11.9 % (ref 11.5–15.5)
WBC Count: 4.9 10*3/uL (ref 4.0–10.5)
nRBC: 0 % (ref 0.0–0.2)

## 2023-01-21 NOTE — Progress Notes (Signed)
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 Phase III POUT started on 01/08/23. 01/08/23. Started gem/carbo every 3 weeks planned for 4 cycles. Gemcitabine days 1, 8 at 1000 mg/m2 Carboplatin day 1 AUC 4.5 C1D8 delayed due to profound neutropenia. Asymptomatic.    Doing well without infection.  Neutropenia recovered.  Urothelial carcinoma (HCC) Proceed with cycle 1 day 8 Return in 2 weeks before C2    The patient understands the plans discussed today and is in agreement with them.  He knows to contact our office if he develops concerns prior to his next appointment.  Melven Sartorius, MD  [facility] CANCER CENTER AT [facility] [address] [place] [address] Dept: [phone] Dept Fax: [phone]   No orders of the defined types were placed in this encounter.     CHIEF COMPLAINT:  CC: High-grade urothelial carcinoma of distal ureter,  Current Treatment:  gem/carbo   INTERVAL HISTORY:  Billy Hernandez is here today for repeat clinical assessment. He denies fevers or chills. He denies pain. His appetite is good. His weight has been stable.  Overall feeling well without any other acute symptoms.  I have reviewed the past medical history, past surgical history, social history and family history with the patient and they are unchanged from previous note.  ALLERGIES:  has No Known Allergies.  MEDICATIONS:  Current Outpatient Medications  Medication Sig Dispense Refill   busPIRone (BUSPAR) 10 MG tablet Take 10 mg by mouth 2 (two)  times daily.     dexamethasone (DECADRON) 4 MG tablet Take 1 tablets (4mg ) by mouth daily starting the day after carboplatin for 3 days. Take with food 30 tablet 1   lisinopril (ZESTRIL) 10 MG tablet Take 10 mg by mouth daily. Started on 09/30/2022     ondansetron (ZOFRAN) 8 MG tablet Take 1 tablet (8 mg total) by mouth every 8 (eight) hours as needed for nausea or vomiting. Start on the third day after carboplatin. 30 tablet 1   prochlorperazine (COMPAZINE) 10 MG tablet Take 1 tablet (10 mg total) by mouth every 6 (six) hours as needed for nausea or vomiting. 30 tablet 1   sildenafil (VIAGRA) 100 MG tablet Take 50 mg by mouth daily as needed for erectile dysfunction.     tamsulosin (FLOMAX) 0.4 MG CAPS capsule Take 0.4 mg by mouth daily.     traZODone (DESYREL) 50 MG tablet Take 50 mg by mouth at bedtime.     No current facility-administered medications for this visit.    HISTORY OF PRESENT ILLNESS:   Oncology History  Urothelial carcinoma (HCC)  12/2018 Miscellaneous   Initially presented with hematuria. 12/2018 CT with 3.5 cm mass in the posterior right bladder.    01/2019 Surgery   TURBT  with gemcitabine for 3.5cm right trigonal tumor. Path: HG papillary urothelial carcinoma.    04/2019 Procedure   Completed 6/6 BCG induction in  and completed 3 maintenance BCG cycles.    10/2020 Surgery   TURBT a small papillary lesion at the bladder neck/prostatic junction. Pathology: Noninvasive high grade papillary urothelial carcinoma. Did not undergo  BCG following TURBT in 10/2020   09/25/2022 Procedure   Cystoscopy with tumor protruding from the right ureteral orifice with adjacent mass affect from the distal right ureter. No papillary lesions within the bladder itself.   10/24/2022 Surgery   TURBT  A. BLADDER LESION, RIGHT LATERAL WALL, BIOPSY:  Noninvasive high-grade urothelial carcinoma with early papillary formation  Muscularis propria (detrusor muscle) is not present   B. URETERAL  TUMOR, RIGHT, BIOPSY:  Noninvasive high-grade urothelial carcinoma    11/12/2022 Initial Diagnosis   Urothelial carcinoma (HCC)   11/12/2022 Surgery   Robotic assisted right laparoscopic nephroureterectomy    A. 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, size 1.8 and 2.7 cm  The carcinoma invades the muscularis (pT2)  Lymphovascular invasion is identified  Urothelial carcinoma in situ is present at renal pelvis  All margins of resection are negative for carcinoma   RENAL PELVIS AND URETER:   Procedure: Nephroureterectomy  Specimen Laterality: Right  Tumor Site: Distal ureter  Histologic Type: Urothelial carcinoma  Histologic Grade: High grade  Tumor Extension: Muscularis  Lymphovascular Invasion: Identified  Margin status for invasive carcinoma: Negative  Margin status for carcinoma in situ/ Noninvasive papillary urothelial  carcinoma: Negative  Regional Lymph Nodes:          No Lymph Nodes Submitted or found: X          Number of Lymph Nodes Involved: NA          Size of Largest Nodal Metastatic Deposit/ specify site: NA          Number of Lymph Nodes Examined: NA  Distant Metastasis: NA  Pathologic Stage Classification (pTNM, AJCC 8th Edition): pT2, pN  Pathologic Findings in Ipsilateral Nonneoplastic Renal Tissue: chronic  obstructive changes with prominent pyelonephritis    12/10/2022 Cancer Staging   Staging form: Renal Pelvis and Ureter, AJCC 8th Edition - Clinical: Stage Unknown (cT2, cNX, cM0) - Signed by Melven Sartorius, MD on 12/10/2022 Stage prefix: Initial diagnosis WHO/ISUP grade (low/high): High Grade Histologic grading system: 2 grade system   01/08/2023 -  Chemotherapy   Patient is on Treatment Plan : BLADDER Carboplatin D1 + Gemcitabine D1,8 q21d     01/08/2023 -  Chemotherapy   Patient is on Treatment Plan :  BLADDER Carboplatin D1 + Gemcitabine D1,8 q21d         REVIEW OF SYSTEMS:   All other  systems were reviewed with the patient and are negative.   VITALS:  Blood pressure (!) 143/75, pulse 73, temperature 98.1 F (36.7 C), resp. rate 20, weight 190 lb 9.6 oz (86.5 kg), SpO2 99%.  Wt Readings from Last 3 Encounters:  01/21/23 190 lb 9.6 oz (86.5 kg)  01/15/23 180 lb 1.6 oz (81.7 kg)  01/08/23 174 lb (78.9 kg)    Body mass index is 25.85 kg/m.  Performance status (ECOG): 0 - Asymptomatic  PHYSICAL EXAM:   GENERAL:alert, no distress and comfortable SKIN: skin color normal EYES: normal, sclera clear OROPHARYNX: no exudate, no erythema    LUNGS: clear to auscultation with normal breathing effort.  No wheeze or rales HEART: regular rate & rhythm and no murmurs and no lower extremity edema ABDOMEN: abdomen soft, non-tender and nondistended Musculoskeletal: no edema   LABORATORY DATA:  I have reviewed the data as listed    Component Value Date/Time   NA 129 (L) 01/15/2023 1149   K 4.3 01/15/2023 1149   CL 97 (L) 01/15/2023 1149  CO2 26 01/15/2023 1149   GLUCOSE 123 (H) 01/15/2023 1149   BUN 13 01/15/2023 1149   CREATININE 0.97 01/15/2023 1149   CALCIUM 9.0 01/15/2023 1149   PROT 6.8 01/15/2023 1149   ALBUMIN 4.0 01/15/2023 1149   AST 16 01/15/2023 1149   ALT 16 01/15/2023 1149   ALKPHOS 49 01/15/2023 1149   BILITOT 0.3 01/15/2023 1149   GFRNONAA >60 01/15/2023 1149   GFRAA >60 11/14/2017 0318    No results found for: "SPEP", "UPEP"  Lab Results  Component Value Date   WBC 4.9 01/21/2023   NEUTROABS 3.1 01/21/2023   HGB 11.9 (L) 01/21/2023   HCT 33.4 (L) 01/21/2023   MCV 91.8 01/21/2023   PLT 178 01/21/2023      Chemistry      Component Value Date/Time   NA 129 (L) 01/15/2023 1149   K 4.3 01/15/2023 1149   CL 97 (L) 01/15/2023 1149   CO2 26 01/15/2023 1149   BUN 13 01/15/2023 1149   CREATININE 0.97 01/15/2023 1149      Component Value Date/Time   CALCIUM 9.0 01/15/2023 1149   ALKPHOS 49 01/15/2023 1149   AST 16 01/15/2023 1149   ALT 16  01/15/2023 1149   BILITOT 0.3 01/15/2023 1149       RADIOGRAPHIC STUDIES: I have personally reviewed the radiological images as listed and agreed with the findings in the report. No results found.

## 2023-01-21 NOTE — Assessment & Plan Note (Signed)
Neutropenia recovered Discussed neutropenic precautions. Continue to monitor each cycle

## 2023-01-21 NOTE — Assessment & Plan Note (Signed)
Proceed with cycle 1 day 8 Return in 2 weeks before C2

## 2023-01-22 ENCOUNTER — Inpatient Hospital Stay: Payer: Medicare HMO

## 2023-01-22 VITALS — BP 138/78 | HR 58 | Temp 97.9°F | Resp 16

## 2023-01-22 DIAGNOSIS — C689 Malignant neoplasm of urinary organ, unspecified: Secondary | ICD-10-CM

## 2023-01-22 DIAGNOSIS — Z5111 Encounter for antineoplastic chemotherapy: Secondary | ICD-10-CM | POA: Diagnosis not present

## 2023-01-22 MED ORDER — SODIUM CHLORIDE 0.9 % IV SOLN: Freq: Once | INTRAVENOUS | Status: AC

## 2023-01-22 MED ORDER — PROCHLORPERAZINE MALEATE 10 MG PO TABS
10.0000 mg | ORAL_TABLET | Freq: Once | ORAL | Status: AC
  Administered 2023-01-22: 10 mg via ORAL
  Filled 2023-01-22: qty 1

## 2023-01-22 MED ORDER — SODIUM CHLORIDE 0.9 % IV SOLN
1000.0000 mg/m2 | Freq: Once | INTRAVENOUS | Status: AC
  Administered 2023-01-22: 2014 mg via INTRAVENOUS
  Filled 2023-01-22: qty 52.97

## 2023-01-22 NOTE — Patient Instructions (Signed)
[place] CANCER CENTER AT [facility]  Discharge Instructions: Thank you for choosing [facility] to provide your oncology and hematology care.   If you have a lab appointment with the Cancer Center, please go directly to the Cancer Center and check in at the registration area.   Wear comfortable clothing and clothing appropriate for easy access to any Portacath or PICC line.   We strive to give you quality time with your provider. You may need to reschedule your appointment if you arrive late (15 or more minutes).  Arriving late affects you and other patients whose appointments are after yours.  Also, if you miss three or more appointments without notifying the office, you may be dismissed from the clinic at the provider's discretion.      For prescription refill requests, have your pharmacy contact our office and allow 72 hours for refills to be completed.    Today you received the following chemotherapy and/or immunotherapy agents: Gemcitabine      To help prevent nausea and vomiting after your treatment, we encourage you to take your nausea medication as directed.  BELOW ARE SYMPTOMS THAT SHOULD BE REPORTED IMMEDIATELY: *FEVER GREATER THAN 100.4 F (38 C) OR HIGHER *CHILLS OR SWEATING *NAUSEA AND VOMITING THAT IS NOT CONTROLLED WITH YOUR NAUSEA MEDICATION *UNUSUAL SHORTNESS OF BREATH *UNUSUAL BRUISING OR BLEEDING *URINARY PROBLEMS (pain or burning when urinating, or frequent urination) *BOWEL PROBLEMS (unusual diarrhea, constipation, pain near the anus) TENDERNESS IN MOUTH AND THROAT WITH OR WITHOUT PRESENCE OF ULCERS (sore throat, sores in mouth, or a toothache) UNUSUAL RASH, SWELLING OR PAIN  UNUSUAL VAGINAL DISCHARGE OR ITCHING   Items with * indicate a potential emergency and should be followed up as soon as possible or go to the Emergency Department if any problems should occur.  Please show the CHEMOTHERAPY ALERT CARD or IMMUNOTHERAPY ALERT CARD at  check-in to the Emergency Department and triage nurse.  Should you have questions after your visit or need to cancel or reschedule your appointment, please contact [place] [facility] AT [facility]  Dept: [phone]  and follow the prompts.  Office hours are 8:00 a.m. to 4:30 p.m. Monday - Friday. Please note that voicemails left after 4:00 p.m. may not be returned until the following business day.  We are closed weekends and major holidays. You have access to a nurse at all times for urgent questions. Please call the main number to the clinic Dept: [phone] and follow the prompts.   For any non-urgent questions, you may also contact your provider using MyChart. We now offer e-Visits for anyone 73 and older to request care online for non-urgent symptoms. For details visit [email or web address].   Also download the MyChart app! Go to the app store, search "MyChart", open the app, select [facility], and log in with your MyChart username and password.

## 2023-02-04 MED FILL — Fosaprepitant Dimeglumine For IV Infusion 150 MG (Base Eq): INTRAVENOUS | Qty: 5 | Status: AC

## 2023-02-04 NOTE — Assessment & Plan Note (Signed)
Proceed with cycle 2 day 1 Return in 1 week

## 2023-02-04 NOTE — Progress Notes (Unsigned)
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 Phase III POUT started on 01/08/23. 01/08/23. Started gem/carbo every 3 weeks planned for 4 cycles. Gemcitabine days 1, 8 at 1000 mg/m2 Carboplatin day 1 AUC 4.5 C1D8 delayed due to profound neutropenia. Asymptomatic.   Doing well without infection.  Neutropenia recovered.  Urothelial carcinoma (HCC) Proceed with cycle 2 day 1 Carbo reduced to AUC 4 Postpone D8 to 12/3 Return on 12/3  At risk for side effect of medication Mild borderline neutropenia Decreased dose of carbo Discussed neutropenic precautions. Continue to monitor each cycle  Drug-induced neutropenia (HCC) Will postpone day 8 Watch for fever and signs of infection over next 2 weeks. Report to ED with fever or signs of infection   The patient understands the plans discussed today and is in agreement with them.  He knows to contact our office if he develops concerns prior to his next appointment.  Melven Sartorius, MD  [place] [facility] - A DEPT OF [facility] [address] [place] [address] Dept: [phone] Dept Fax: [phone]   No orders of the defined types were placed in this encounter.     CHIEF COMPLAINT:  CC: Urothelial carcinoma  Current Treatment:  carbo/gem  INTERVAL HISTORY:  Billy Hernandez is here today for repeat clinical assessment. He denies fevers or chills. He denies chest pain, stomach pain. No coughing, short of breath, diarrhea, constipation. No burning urination or dysuria. No  bleeding. His appetite is good.   I have reviewed the past medical history, past surgical history, social history and family history with the patient and they are unchanged from previous note.  ALLERGIES:  has No Known Allergies.  MEDICATIONS:  Current Outpatient Medications  Medication Sig Dispense Refill   busPIRone (BUSPAR) 10 MG tablet Take 10 mg by mouth 2 (two) times daily.     dexamethasone (DECADRON) 4 MG tablet Take 1 tablets (4mg ) by mouth daily starting the day after carboplatin for 3 days. Take with food 30 tablet 1   lisinopril (ZESTRIL) 10 MG tablet Take 10 mg by mouth daily. Started on 09/30/2022     ondansetron (ZOFRAN) 8 MG tablet Take 1 tablet (8 mg total) by mouth every 8 (eight) hours as needed for nausea or vomiting. Start on the third day after carboplatin. 30 tablet 1   prochlorperazine (COMPAZINE) 10 MG tablet Take 1 tablet (10 mg total) by mouth every 6 (six) hours as needed for nausea or vomiting. 30 tablet 1   sildenafil (VIAGRA) 100 MG tablet Take 50 mg by mouth daily as needed for erectile dysfunction.     tamsulosin (FLOMAX) 0.4 MG CAPS capsule Take 0.4 mg by mouth daily.     traZODone (DESYREL) 50 MG tablet Take 50 mg by mouth at bedtime.     No current facility-administered medications for this visit.    HISTORY OF PRESENT ILLNESS:   Oncology History  Urothelial carcinoma (HCC)  12/2018 Miscellaneous   Initially presented with hematuria. 12/2018 CT with 3.5 cm mass in the posterior right bladder.    01/2019 Surgery   TURBT  with gemcitabine for 3.5cm right trigonal tumor. Path: HG papillary urothelial carcinoma.    04/2019 Procedure   Completed 6/6 BCG induction in  and completed 3 maintenance BCG cycles.    10/2020 Surgery   TURBT a small papillary lesion at the bladder neck/prostatic junction. Pathology: Noninvasive high grade papillary urothelial carcinoma. Did not undergo BCG following TURBT in 10/2020   09/25/2022 Procedure   Cystoscopy with tumor  protruding from the right ureteral orifice with adjacent mass affect from the distal right ureter. No papillary lesions within the bladder itself.   10/24/2022 Surgery   TURBT  A. BLADDER LESION, RIGHT LATERAL WALL, BIOPSY:  Noninvasive high-grade urothelial carcinoma with early papillary formation  Muscularis propria (detrusor muscle) is not present   B. URETERAL TUMOR, RIGHT, BIOPSY:  Noninvasive high-grade urothelial carcinoma    11/12/2022 Initial Diagnosis   Urothelial carcinoma (HCC)   11/12/2022 Surgery   Robotic assisted right laparoscopic nephroureterectomy    A. 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, size 1.8 and 2.7 cm  The carcinoma invades the muscularis (pT2)  Lymphovascular invasion is identified  Urothelial carcinoma in situ is present at renal pelvis  All margins of resection are negative for carcinoma   RENAL PELVIS AND URETER:   Procedure: Nephroureterectomy  Specimen Laterality: Right  Tumor Site: Distal ureter  Histologic Type: Urothelial carcinoma  Histologic Grade: High grade  Tumor Extension: Muscularis  Lymphovascular Invasion: Identified  Margin status for invasive carcinoma: Negative  Margin status for carcinoma in situ/ Noninvasive papillary urothelial  carcinoma: Negative  Regional Lymph Nodes:          No Lymph Nodes Submitted or found: X          Number of Lymph Nodes Involved: NA          Size of Largest Nodal Metastatic Deposit/ specify site: NA          Number of Lymph Nodes Examined: NA  Distant Metastasis: NA  Pathologic Stage Classification (pTNM, AJCC 8th Edition): pT2, pN  Pathologic Findings in Ipsilateral Nonneoplastic Renal Tissue: chronic  obstructive changes with prominent pyelonephritis    12/10/2022 Cancer Staging   Staging form: Renal Pelvis and Ureter, AJCC 8th Edition - Clinical: Stage Unknown (cT2, cNX, cM0) - Signed by Melven Sartorius, MD on 12/10/2022 Stage prefix:  Initial diagnosis WHO/ISUP grade (low/high): High Grade Histologic grading system: 2 grade system   01/08/2023 -  Chemotherapy   Patient is on Treatment Plan : BLADDER Carboplatin D1 + Gemcitabine D1,8 q21d     01/08/2023 -  Chemotherapy   Patient is on Treatment Plan :  BLADDER Carboplatin D1 + Gemcitabine D1,8 q21d         REVIEW OF SYSTEMS:   All relevant systems were reviewed with the patient and are negative.   VITALS:  Blood pressure (!) 141/71, pulse 72, temperature 98.1 F (36.7 C), resp. rate 20, weight 190 lb 9.6 oz (86.5 kg), SpO2 99%.  Wt Readings from Last 3 Encounters:  02/05/23 190 lb 9.6 oz (86.5 kg)  01/21/23 190 lb 9.6 oz (86.5 kg)  01/15/23 180 lb 1.6 oz (81.7 kg)    Body mass index is 25.85 kg/m.  Performance status (ECOG): 0 - Asymptomatic  PHYSICAL EXAM:   GENERAL:alert, no distress and comfortable SKIN: skin color normal, no rashes  EYES: normal, sclera clear OROPHARYNX: no exudate, no erythema    LUNGS: clear to auscultation with normal breathing effort.  No wheeze  or rales HEART: regular rate & rhythm and no murmurs and no lower extremity edema ABDOMEN: abdomen soft, non-tender and nondistended Musculoskeletal: no edema   LABORATORY DATA:  I have reviewed the data as listed    Component Value Date/Time   NA 131 (L) 02/05/2023 0906   K 4.6 02/05/2023 0906   CL 98 02/05/2023 0906   CO2 26 02/05/2023 0906   GLUCOSE 83 02/05/2023 0906   BUN 8 02/05/2023 0906   CREATININE 1.03 02/05/2023 0906   CALCIUM 9.1 02/05/2023 0906   PROT 6.7 02/05/2023 0906   ALBUMIN 4.0 02/05/2023 0906   AST 14 (L) 02/05/2023 0906   ALT 14 02/05/2023 0906   ALKPHOS 50 02/05/2023 0906   BILITOT 0.4 02/05/2023 0906   GFRNONAA >60 02/05/2023 0906   GFRAA >60 11/14/2017 0318    No results found for: "SPEP", "UPEP"  Lab Results  Component Value Date   WBC 3.4 (L) 02/05/2023   NEUTROABS 1.1 (L) 02/05/2023   HGB 11.8 (L) 02/05/2023   HCT 33.2 (L)  02/05/2023   MCV 92.7 02/05/2023   PLT 203 02/05/2023      Chemistry      Component Value Date/Time   NA 131 (L) 02/05/2023 0906   K 4.6 02/05/2023 0906   CL 98 02/05/2023 0906   CO2 26 02/05/2023 0906   BUN 8 02/05/2023 0906   CREATININE 1.03 02/05/2023 0906      Component Value Date/Time   CALCIUM 9.1 02/05/2023 0906   ALKPHOS 50 02/05/2023 0906   AST 14 (L) 02/05/2023 0906   ALT 14 02/05/2023 0906   BILITOT 0.4 02/05/2023 0906       RADIOGRAPHIC STUDIES: I have personally reviewed the radiological images as listed and agreed with the findings in the report. No results found.

## 2023-02-04 NOTE — Assessment & Plan Note (Signed)
Neutropenia recovered Discussed neutropenic precautions. Continue to monitor each cycle

## 2023-02-05 ENCOUNTER — Inpatient Hospital Stay: Payer: Medicare HMO | Attending: Hematology and Oncology

## 2023-02-05 ENCOUNTER — Inpatient Hospital Stay: Payer: Medicare HMO

## 2023-02-05 VITALS — BP 141/71 | HR 72 | Temp 98.1°F | Resp 20 | Wt 190.6 lb

## 2023-02-05 DIAGNOSIS — D702 Other drug-induced agranulocytosis: Secondary | ICD-10-CM | POA: Insufficient documentation

## 2023-02-05 DIAGNOSIS — C689 Malignant neoplasm of urinary organ, unspecified: Secondary | ICD-10-CM

## 2023-02-05 DIAGNOSIS — Z79899 Other long term (current) drug therapy: Secondary | ICD-10-CM | POA: Insufficient documentation

## 2023-02-05 DIAGNOSIS — Z9189 Other specified personal risk factors, not elsewhere classified: Secondary | ICD-10-CM | POA: Diagnosis not present

## 2023-02-05 DIAGNOSIS — C641 Malignant neoplasm of right kidney, except renal pelvis: Secondary | ICD-10-CM | POA: Insufficient documentation

## 2023-02-05 DIAGNOSIS — Z5111 Encounter for antineoplastic chemotherapy: Secondary | ICD-10-CM | POA: Diagnosis present

## 2023-02-05 DIAGNOSIS — Z7952 Long term (current) use of systemic steroids: Secondary | ICD-10-CM | POA: Insufficient documentation

## 2023-02-05 LAB — CMP (CANCER CENTER ONLY)
ALT: 14 U/L (ref 0–44)
AST: 14 U/L — ABNORMAL LOW (ref 15–41)
Albumin: 4 g/dL (ref 3.5–5.0)
Alkaline Phosphatase: 50 U/L (ref 38–126)
Anion gap: 7 (ref 5–15)
BUN: 8 mg/dL (ref 8–23)
CO2: 26 mmol/L (ref 22–32)
Calcium: 9.1 mg/dL (ref 8.9–10.3)
Chloride: 98 mmol/L (ref 98–111)
Creatinine: 1.03 mg/dL (ref 0.61–1.24)
GFR, Estimated: >60 mL/min (ref >60)
Glucose, Bld: 83 mg/dL (ref 70–99)
Potassium: 4.6 mmol/L (ref 3.5–5.1)
Sodium: 131 mmol/L — ABNORMAL LOW (ref 135–145)
Total Bilirubin: 0.4 mg/dL (ref <1.2)
Total Protein: 6.7 g/dL (ref 6.5–8.1)

## 2023-02-05 LAB — CBC WITH DIFFERENTIAL (CANCER CENTER ONLY)
Abs Immature Granulocytes: 0.01 10*3/uL (ref 0.00–0.07)
Basophils Absolute: 0 10*3/uL (ref 0.0–0.1)
Basophils Relative: 1 %
Eosinophils Absolute: 0.1 10*3/uL (ref 0.0–0.5)
Eosinophils Relative: 3 %
HCT: 33.2 % — ABNORMAL LOW (ref 39.0–52.0)
Hemoglobin: 11.8 g/dL — ABNORMAL LOW (ref 13.0–17.0)
Immature Granulocytes: 0 %
Lymphocytes Relative: 38 %
Lymphs Abs: 1.3 10*3/uL (ref 0.7–4.0)
MCH: 33 pg (ref 26.0–34.0)
MCHC: 35.5 g/dL (ref 30.0–36.0)
MCV: 92.7 fL (ref 80.0–100.0)
Monocytes Absolute: 0.8 10*3/uL (ref 0.1–1.0)
Monocytes Relative: 24 %
Neutro Abs: 1.1 10*3/uL — ABNORMAL LOW (ref 1.7–7.7)
Neutrophils Relative %: 34 %
Platelet Count: 203 10*3/uL (ref 150–400)
RBC: 3.58 MIL/uL — ABNORMAL LOW (ref 4.22–5.81)
RDW: 13.3 % (ref 11.5–15.5)
WBC Count: 3.4 10*3/uL — ABNORMAL LOW (ref 4.0–10.5)
nRBC: 0 % (ref 0.0–0.2)

## 2023-02-05 MED ORDER — GEMCITABINE HCL CHEMO INJECTION 1 GM/26.3ML
1000.0000 mg/m2 | Freq: Once | INTRAVENOUS | Status: AC
  Administered 2023-02-05: 2014 mg via INTRAVENOUS
  Filled 2023-02-05: qty 52.61

## 2023-02-05 MED ORDER — CARBOPLATIN CHEMO INJECTION 600 MG/60ML
411.2000 mg | Freq: Once | INTRAVENOUS | Status: AC
  Administered 2023-02-05: 410 mg via INTRAVENOUS
  Filled 2023-02-05: qty 41

## 2023-02-05 MED ORDER — SODIUM CHLORIDE 0.9 % IV SOLN
150.0000 mg | Freq: Once | INTRAVENOUS | Status: AC
  Administered 2023-02-05: 150 mg via INTRAVENOUS
  Filled 2023-02-05: qty 150

## 2023-02-05 MED ORDER — SODIUM CHLORIDE 0.9 % IV SOLN: Freq: Once | INTRAVENOUS | Status: AC

## 2023-02-05 MED ORDER — DEXAMETHASONE SODIUM PHOSPHATE 10 MG/ML IJ SOLN
10.0000 mg | Freq: Once | INTRAMUSCULAR | Status: AC
  Administered 2023-02-05: 10 mg via INTRAVENOUS
  Filled 2023-02-05: qty 1

## 2023-02-05 MED ORDER — PALONOSETRON HCL INJECTION 0.25 MG/5ML
0.2500 mg | Freq: Once | INTRAVENOUS | Status: AC
  Administered 2023-02-05: 0.25 mg via INTRAVENOUS
  Filled 2023-02-05: qty 5

## 2023-02-05 NOTE — Patient Instructions (Signed)
[facility] - A DEPT OF [facility]  Discharge Instructions: Thank you for choosing [facility] to provide your oncology and hematology care.   If you have a lab appointment with the Cancer Center, please go directly to the Cancer Center and check in at the registration area.   Wear comfortable clothing and clothing appropriate for easy access to any Portacath or PICC line.   We strive to give you quality time with your provider. You may need to reschedule your appointment if you arrive late (15 or more minutes).  Arriving late affects you and other patients whose appointments are after yours.  Also, if you miss three or more appointments without notifying the office, you may be dismissed from the clinic at the provider's discretion.      For prescription refill requests, have your pharmacy contact our office and allow 72 hours for refills to be completed.    Today you received the following chemotherapy agents Gemzar and Carboplatin   To help prevent nausea and vomiting after your treatment, we encourage you to take your nausea medication as directed.  BELOW ARE SYMPTOMS THAT SHOULD BE REPORTED IMMEDIATELY: *FEVER GREATER THAN 100.4 F (38 C) OR HIGHER *CHILLS OR SWEATING *NAUSEA AND VOMITING THAT IS NOT CONTROLLED WITH YOUR NAUSEA MEDICATION *UNUSUAL SHORTNESS OF BREATH *UNUSUAL BRUISING OR BLEEDING *URINARY PROBLEMS (pain or burning when urinating, or frequent urination) *BOWEL PROBLEMS (unusual diarrhea, constipation, pain near the anus) TENDERNESS IN MOUTH AND THROAT WITH OR WITHOUT PRESENCE OF ULCERS (sore throat, sores in mouth, or a toothache) UNUSUAL RASH, SWELLING OR PAIN  UNUSUAL VAGINAL DISCHARGE OR ITCHING   Items with * indicate a potential emergency and should be followed up as soon as possible or go to the Emergency Department if any problems should occur.  Please show the CHEMOTHERAPY ALERT CARD or IMMUNOTHERAPY ALERT CARD  at check-in to the Emergency Department and triage nurse.  Should you have questions after your visit or need to cancel or reschedule your appointment, please contact [facility] - A DEPT OF [facility]  Dept: [phone]  and follow the prompts.  Office hours are 8:00 a.m. to 4:30 p.m. Monday - Friday. Please note that voicemails left after 4:00 p.m. may not be returned until the following business day.  We are closed weekends and major holidays. You have access to a nurse at all times for urgent questions. Please call the main number to the clinic Dept: [phone] and follow the prompts.   For any non-urgent questions, you may also contact your provider using MyChart. We now offer e-Visits for anyone 72 and older to request care online for non-urgent symptoms. For details visit [email or web address].   Also download the MyChart app! Go to the app store, search "MyChart", open the app, select [place], and log in with your MyChart username and password.   Neutropenia Neutropenia is a condition that occurs when you have low levels of neutrophils. Neutrophils are a type of white blood cells. They are made in the spongy center of bones (bone marrow). They fight infections. Neutrophils are your body's main defense against infections. The fewer neutrophils you have and the longer your body remains without them, the greater your risk of getting a severe infection. What are the causes? This condition can occur if your body uses up or destroys neutrophils faster than your bone marrow can make them. Neutropenia may be caused by: A bacterial or  fungal infection. Allergic disorders. Reactions to some medicines. An autoimmune disease. An enlarged spleen. This condition can also occur if your bone marrow does not produce enough neutrophils. This problem may be caused by: Cancer. Cancer treatments, such as radiation or chemotherapy. Viral infections. Medicines,  such as phenytoin. Vitamin B12 deficiency. Diseases of the bone marrow. Environmental toxins, such as insecticides. What are the signs or symptoms? This condition does not usually cause symptoms. If symptoms are present, they are usually caused by an underlying infection. Symptoms of an infection may include: Fever. Chills. Swollen glands. Mouth ulcers. Cough. Rash or skin infection. Skin may be red, swollen, or painful. Abdominal or rectal pain. Frequent urination or pain or burning with urination. Because neutropenia weakens the immune system, symptoms of infection may be reduced. It is important to be aware of any changes in your body and talk to your health care provider. How is this diagnosed? This condition is diagnosed based on your medical history and a physical exam. Tests will also be done, such as: A complete blood count (CBC). Bone marrow biopsy. This is collecting a sample of bone marrow for testing. A chest X-ray. A urine culture. A blood culture. How is this treated? Treatment depends on the underlying cause and severity of your condition. Mild neutropenia may not require treatment. Treatment may include medicines, such as: Antibiotic medicine given through an IV. Antiviral medicines. Antifungal medicines. A medicine to increase production of neutrophils (colony-stimulating factor). You may get this medicine through an IV or by injection. Steroids given through an IV. If an underlying condition is causing neutropenia, you may need treatment for that condition. If medicines or cancer treatments are causing neutropenia, your health care provider may have you stop the medicines or treatment. Follow these instructions at home: Medicines  Take over-the-counter and prescription medicines only as told by your health care provider. Get an annual flu shot. Ask your health care provider whether you or anyone you live with needs any other vaccines. Eating and drinking Do not  share food utensils. Do not eat unpasteurized foods. Do not eat raw or undercooked meat, eggs, or seafood. Do not eat unwashed, raw fruits or vegetables. Lifestyle Avoid exposure to groups of people or children. Avoid being around people who are sick. Avoid being around live plants or fresh flowers. Avoid being around dirt or dust, such as in construction areas or gardens. Wear gloves if you are going to do yard work or gardening. Do not provide direct care for pets. Avoid animal droppings. Do not clean litter boxes and bird cages. Do not have sex unless your health care provider has approved. Hygiene  Bathe daily. Clean the area between the genitals and the anus (perineal area) after you urinate or have a bowel movement. If you are male, wipe from front to back. Get regular dental care and brush your teeth with a soft toothbrush before and after meals. Do not use a regular razor. Use an electric razor to remove hair. Wash your hands often with soap and water for at least 20 seconds. Make sure others who come in contact with you also wash their hands. If soap and water are not available, use hand sanitizer. General instructions Take steps to reduce your risk of injury or infection. Follow any precautions as told by your health care provider. Take actions to avoid cuts and burns. For example: Be cautious when you use knives. Always cut away from yourself. Keep knives in protective sheaths or guards when not  in use. Use oven mitts when you cook with a hot stove, oven, or grill. Stand a safe distance away from open fires. Do not use tampons, enemas, or rectal suppositories unless your health care provider has approved. Keep all follow-up visits. This is important. Contact a health care provider if: You have a cough. You have a sore throat. You develop sores in your mouth or anus. You have a warm, red, or tender area on your skin. You have red streaks on the skin. You develop a  rash. You have swollen lymph nodes. You have frequent or painful urination. You have vaginal discharge or itching. Get help right away if: You have a fever. You have chills or shaking. You have nausea or vomiting. You have a lot of fatigue. You have shortness of breath. Summary Neutropenia is a condition that occurs when you have a lower-than-normal level of a type of white blood cell (neutrophils) in your body. This condition can occur if your body uses up or destroys neutrophils faster than your bone marrow can make them. Treatment depends on the underlying cause and severity of your condition. Mild neutropenia may not require treatment. Follow any precautions as told by your health care provider to reduce your risk for injury or infection. This information is not intended to replace advice given to you by your health care provider. Make sure you discuss any questions you have with your health care provider. Document Revised: 09/07/2020 Document Reviewed: 09/07/2020 Elsevier Patient Education  2024 [employer].

## 2023-02-05 NOTE — Assessment & Plan Note (Signed)
Will postpone day 8 Watch for fever and signs of infection over next 2 weeks. Report to ED with fever or signs of infection

## 2023-02-05 NOTE — Addendum Note (Signed)
Addended by: Geanie Berlin on: 02/05/2023 10:26 AM   Modules accepted: Orders

## 2023-02-07 ENCOUNTER — Other Ambulatory Visit: Payer: Self-pay

## 2023-02-12 ENCOUNTER — Ambulatory Visit: Payer: Medicare HMO

## 2023-02-12 ENCOUNTER — Other Ambulatory Visit: Payer: Medicare HMO

## 2023-02-25 NOTE — Assessment & Plan Note (Signed)
Recurrent neutropenia Decreased dose of carbo Discussed neutropenic precautions. Continue to monitor each cycle

## 2023-02-25 NOTE — Progress Notes (Unsigned)
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 Phase III POUT started on 01/08/23. 01/08/23. Started gem/carbo every 3 weeks planned for 4 cycles. Gemcitabine days 1, 8 at 1000 mg/m2 Carboplatin day 1 AUC 4.5 C1D8 delayed due to profound neutropenia. Asymptomatic. C2D8 delayed due to neutropenia.   Doing well without infection.  Neutropenia recovered.  Given the recurrent cytopenia, will change treatment to day 1, 15 of every 28 days for remainder cycles.  Urothelial carcinoma (HCC) Proceed with cycle 2 day 8 Carbo reduced to AUC 4 Return on 12/17 for C3D1 Plan on every other week schedule  At risk for side effect of medication Recurrent neutropenia Decreased dose of carbo Discussed neutropenic precautions. Continue to monitor each cycle  Drug-induced neutropenia (HCC) Ok to treatment today Change to every other week treatment Neutropenic precautions    The patient understands the plans discussed today and is in agreement with them.  He knows to contact our office if he develops concerns prior to his next appointment.  Melven Sartorius, MD  [facility] - A DEPT [facility] [address] [place] [address] Dept: [phone] Dept Fax: [phone]   Orders Placed This Encounter  Procedures   CBC with Differential (Cancer Center Only)    Standing Status:   Future    Standing Expiration Date:   04/08/2024   CMP (Cancer Center only)    Standing Status:   Future    Standing Expiration Date:   04/08/2024    CBC with Differential (Cancer Center Only)    Standing Status:   Future    Standing Expiration Date:   04/22/2024   CMP (Cancer Center only)    Standing Status:   Future    Standing Expiration Date:   04/22/2024      CHIEF COMPLAINT:  CC: UC  Current Treatment:  carbo/gem  INTERVAL HISTORY:  Billy Hernandez is here today for repeat clinical assessment. He denies fevers or chills. He denies mouth sore, or any pain. His appetite is good.  No coughing, short of breath, chest pain, stomach pain, nausea, vomiting. No difficulty urinating, hematuria. No joint pain or muscle ache.  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  I have reviewed the past medical history, past surgical history, social history and family history with the patient and they are unchanged from previous note.  ALLERGIES:  has No Known Allergies.  MEDICATIONS:  Current Outpatient Medications  Medication Sig Dispense Refill   busPIRone (BUSPAR) 10 MG tablet Take 10 mg by mouth 2 (two) times daily.     dexamethasone (DECADRON) 4 MG tablet Take 1 tablets (4mg ) by mouth daily starting the day after carboplatin for 3 days. Take with food 30 tablet 1   lisinopril (ZESTRIL) 10 MG tablet Take 10 mg by mouth daily. Started on 09/30/2022     ondansetron (ZOFRAN) 8 MG tablet Take 1 tablet (8 mg total) by mouth every 8 (eight) hours as needed for nausea or vomiting. Start on the third day after carboplatin. 30 tablet 1   prochlorperazine (COMPAZINE) 10 MG tablet Take 1 tablet (  10 mg total) by mouth every 6 (six) hours as needed for nausea or vomiting. 30 tablet 1   sildenafil (VIAGRA) 100 MG tablet Take 50 mg by mouth daily as needed for erectile dysfunction.     tamsulosin (FLOMAX) 0.4 MG CAPS capsule Take 0.4 mg by mouth daily.     traZODone (DESYREL) 50 MG tablet Take 50 mg by mouth at bedtime.     No current facility-administered medications for this visit.   Facility-Administered Medications Ordered in Other Visits  Medication Dose Route Frequency  Provider Last Rate Last Admin   gemcitabine (GEMZAR) 2,014 mg in sodium chloride 0.9 % 250 mL chemo infusion  1,000 mg/m2 (Treatment Plan Recorded) Intravenous Once Melven Sartorius, MD        HISTORY OF PRESENT ILLNESS:   Oncology History  Urothelial carcinoma (HCC)  12/2018 Miscellaneous   Initially presented with hematuria. 12/2018 CT with 3.5 cm mass in the posterior right bladder.    01/2019 Surgery   TURBT  with gemcitabine for 3.5cm right trigonal tumor. Path: HG papillary urothelial carcinoma.    04/2019 Procedure   Completed 6/6 BCG induction in  and completed 3 maintenance BCG cycles.    10/2020 Surgery   TURBT a small papillary lesion at the bladder neck/prostatic junction. Pathology: Noninvasive high grade papillary urothelial carcinoma. Did not undergo BCG following TURBT in 10/2020   09/25/2022 Procedure   Cystoscopy with tumor protruding from the right ureteral orifice with adjacent mass affect from the distal right ureter. No papillary lesions within the bladder itself.   10/24/2022 Surgery   TURBT  A. BLADDER LESION, RIGHT LATERAL WALL, BIOPSY:  Noninvasive high-grade urothelial carcinoma with early papillary formation  Muscularis propria (detrusor muscle) is not present   B. URETERAL TUMOR, RIGHT, BIOPSY:  Noninvasive high-grade urothelial carcinoma    11/12/2022 Initial Diagnosis   Urothelial carcinoma (HCC)   11/12/2022 Surgery   Robotic assisted right laparoscopic nephroureterectomy    A. 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, size 1.8 and 2.7 cm  The carcinoma invades the muscularis (pT2)  Lymphovascular invasion is identified  Urothelial carcinoma in situ is present at renal pelvis  All margins of resection are negative for carcinoma   RENAL PELVIS AND URETER:   Procedure: Nephroureterectomy  Specimen Laterality: Right  Tumor Site: Distal ureter  Histologic Type: Urothelial carcinoma  Histologic  Grade: High grade  Tumor Extension: Muscularis  Lymphovascular Invasion: Identified  Margin status for invasive carcinoma: Negative  Margin status for carcinoma in situ/ Noninvasive papillary urothelial  carcinoma: Negative  Regional Lymph Nodes:          No Lymph Nodes Submitted or found: X          Number of Lymph Nodes Involved: NA          Size of Largest Nodal Metastatic Deposit/ specify site: NA          Number of Lymph Nodes Examined: NA  Distant Metastasis: NA  Pathologic Stage Classification (pTNM, AJCC 8th Edition): pT2, pN  Pathologic Findings in Ipsilateral Nonneoplastic Renal Tissue: chronic  obstructive changes with prominent pyelonephritis    12/10/2022 Cancer Staging   Staging form: Renal Pelvis and Ureter, AJCC 8th Edition - Clinical: Stage Unknown (cT2, cNX, cM0) - Signed by Melven Sartorius, MD on 12/10/2022 Stage prefix: Initial diagnosis WHO/ISUP grade (low/high): High Grade Histologic grading system: 2 grade system   01/08/2023 -  Chemotherapy   Patient is on  Treatment Plan : BLADDER Carboplatin D1 + Gemcitabine D1,8 q21d     01/08/2023 -  Chemotherapy   Patient is on Treatment Plan :  BLADDER Carboplatin D1 + Gemcitabine D1,8 q21d         REVIEW OF SYSTEMS:   All relevant systems were reviewed with the patient and are negative.   VITALS:  Blood pressure (!) 163/79, pulse 69, temperature 97.6 F (36.4 C), temperature source Temporal, resp. rate 18, height 6' (1.829 m), weight 191 lb 11.2 oz (87 kg), SpO2 100%.  Wt Readings from Last 3 Encounters:  02/26/23 191 lb 11.2 oz (87 kg)  02/05/23 190 lb 9.6 oz (86.5 kg)  01/21/23 190 lb 9.6 oz (86.5 kg)    Body mass index is 26 kg/m.  Performance status (ECOG): 0 - Asymptomatic  PHYSICAL EXAM:   GENERAL:alert, no distress and comfortable SKIN: skin color normal, no rashes  EYES: normal, sclera clear OROPHARYNX: no exudate, no erythema    LUNGS: clear to auscultation with normal breathing effort.  No  wheeze or rales HEART: regular rate & rhythm and no murmurs and no lower extremity edema ABDOMEN: abdomen soft, non-tender and nondistended Musculoskeletal: no edema  LABORATORY DATA:  I have reviewed the data as listed    Component Value Date/Time   NA 129 (L) 02/26/2023 1236   K 4.2 02/26/2023 1236   CL 97 (L) 02/26/2023 1236   CO2 28 02/26/2023 1236   GLUCOSE 124 (H) 02/26/2023 1236   BUN 10 02/26/2023 1236   CREATININE 0.89 02/26/2023 1236   CALCIUM 9.1 02/26/2023 1236   PROT 6.8 02/26/2023 1236   ALBUMIN 4.1 02/26/2023 1236   AST 16 02/26/2023 1236   ALT 13 02/26/2023 1236   ALKPHOS 47 02/26/2023 1236   BILITOT 0.3 02/26/2023 1236   GFRNONAA >60 02/26/2023 1236   GFRAA >60 11/14/2017 0318    No results found for: "SPEP", "UPEP"  Lab Results  Component Value Date   WBC 3.5 (L) 02/26/2023   NEUTROABS 1.6 (L) 02/26/2023   HGB 11.3 (L) 02/26/2023   HCT 31.7 (L) 02/26/2023   MCV 93.8 02/26/2023   PLT 303 02/26/2023      Chemistry      Component Value Date/Time   NA 129 (L) 02/26/2023 1236   K 4.2 02/26/2023 1236   CL 97 (L) 02/26/2023 1236   CO2 28 02/26/2023 1236   BUN 10 02/26/2023 1236   CREATININE 0.89 02/26/2023 1236      Component Value Date/Time   CALCIUM 9.1 02/26/2023 1236   ALKPHOS 47 02/26/2023 1236   AST 16 02/26/2023 1236   ALT 13 02/26/2023 1236   BILITOT 0.3 02/26/2023 1236       RADIOGRAPHIC STUDIES: I have personally reviewed the radiological images as listed and agreed with the findings in the report. No results found.

## 2023-02-25 NOTE — Assessment & Plan Note (Addendum)
Proceed with cycle 2 day 8 Carbo reduced to AUC 4 Return on 12/17 for C3D1 Plan on every other week schedule

## 2023-02-26 ENCOUNTER — Inpatient Hospital Stay: Payer: Medicare HMO

## 2023-02-26 ENCOUNTER — Inpatient Hospital Stay: Payer: Medicare HMO | Attending: Hematology and Oncology

## 2023-02-26 VITALS — BP 163/79 | HR 69 | Temp 97.6°F | Resp 18 | Ht 72.0 in | Wt 191.7 lb

## 2023-02-26 DIAGNOSIS — C689 Malignant neoplasm of urinary organ, unspecified: Secondary | ICD-10-CM

## 2023-02-26 DIAGNOSIS — Z5111 Encounter for antineoplastic chemotherapy: Secondary | ICD-10-CM | POA: Insufficient documentation

## 2023-02-26 DIAGNOSIS — C641 Malignant neoplasm of right kidney, except renal pelvis: Secondary | ICD-10-CM | POA: Insufficient documentation

## 2023-02-26 DIAGNOSIS — Z79899 Other long term (current) drug therapy: Secondary | ICD-10-CM | POA: Insufficient documentation

## 2023-02-26 DIAGNOSIS — Z9189 Other specified personal risk factors, not elsewhere classified: Secondary | ICD-10-CM | POA: Diagnosis not present

## 2023-02-26 DIAGNOSIS — D702 Other drug-induced agranulocytosis: Secondary | ICD-10-CM

## 2023-02-26 LAB — CBC WITH DIFFERENTIAL (CANCER CENTER ONLY)
Abs Immature Granulocytes: 0.01 10*3/uL (ref 0.00–0.07)
Basophils Absolute: 0 10*3/uL (ref 0.0–0.1)
Basophils Relative: 1 %
Eosinophils Absolute: 0.1 10*3/uL (ref 0.0–0.5)
Eosinophils Relative: 1 %
HCT: 31.7 % — ABNORMAL LOW (ref 39.0–52.0)
Hemoglobin: 11.3 g/dL — ABNORMAL LOW (ref 13.0–17.0)
Immature Granulocytes: 0 %
Lymphocytes Relative: 27 %
Lymphs Abs: 0.9 10*3/uL (ref 0.7–4.0)
MCH: 33.4 pg (ref 26.0–34.0)
MCHC: 35.6 g/dL (ref 30.0–36.0)
MCV: 93.8 fL (ref 80.0–100.0)
Monocytes Absolute: 0.8 10*3/uL (ref 0.1–1.0)
Monocytes Relative: 24 %
Neutro Abs: 1.6 10*3/uL — ABNORMAL LOW (ref 1.7–7.7)
Neutrophils Relative %: 47 %
Platelet Count: 303 10*3/uL (ref 150–400)
RBC: 3.38 MIL/uL — ABNORMAL LOW (ref 4.22–5.81)
RDW: 15.6 % — ABNORMAL HIGH (ref 11.5–15.5)
WBC Count: 3.5 10*3/uL — ABNORMAL LOW (ref 4.0–10.5)
nRBC: 0 % (ref 0.0–0.2)

## 2023-02-26 LAB — CMP (CANCER CENTER ONLY)
ALT: 13 U/L (ref 0–44)
AST: 16 U/L (ref 15–41)
Albumin: 4.1 g/dL (ref 3.5–5.0)
Alkaline Phosphatase: 47 U/L (ref 38–126)
Anion gap: 4 — ABNORMAL LOW (ref 5–15)
BUN: 10 mg/dL (ref 8–23)
CO2: 28 mmol/L (ref 22–32)
Calcium: 9.1 mg/dL (ref 8.9–10.3)
Chloride: 97 mmol/L — ABNORMAL LOW (ref 98–111)
Creatinine: 0.89 mg/dL (ref 0.61–1.24)
GFR, Estimated: >60 mL/min (ref >60)
Glucose, Bld: 124 mg/dL — ABNORMAL HIGH (ref 70–99)
Potassium: 4.2 mmol/L (ref 3.5–5.1)
Sodium: 129 mmol/L — ABNORMAL LOW (ref 135–145)
Total Bilirubin: 0.3 mg/dL (ref <1.2)
Total Protein: 6.8 g/dL (ref 6.5–8.1)

## 2023-02-26 MED ORDER — PROCHLORPERAZINE MALEATE 10 MG PO TABS
10.0000 mg | ORAL_TABLET | Freq: Once | ORAL | Status: AC
  Administered 2023-02-26: 10 mg via ORAL
  Filled 2023-02-26: qty 1

## 2023-02-26 MED ORDER — SODIUM CHLORIDE 0.9 % IV SOLN: Freq: Once | INTRAVENOUS | Status: AC

## 2023-02-26 MED ORDER — SODIUM CHLORIDE 0.9 % IV SOLN
1000.0000 mg/m2 | Freq: Once | INTRAVENOUS | Status: AC
  Administered 2023-02-26: 2014 mg via INTRAVENOUS
  Filled 2023-02-26: qty 52.97

## 2023-02-26 NOTE — Patient Instructions (Signed)
[facility] - [facility]  Discharge Instructions: Thank you for choosing [facility] to provide your oncology and hematology care.   If you have a lab appointment with the Cancer Center, please go directly to the Cancer Center and check in at the registration area.   Wear comfortable clothing and clothing appropriate for easy access to any Portacath or PICC line.   We strive to give you quality time with your provider. You may need to reschedule your appointment if you arrive late (15 or more minutes).  Arriving late affects you and other patients whose appointments are after yours.  Also, if you miss three or more appointments without notifying the office, you may be dismissed from the clinic at the provider's discretion.      For prescription refill requests, have your pharmacy contact our office and allow 72 hours for refills to be completed.    Today you received the following chemotherapy and/or immunotherapy agents gemcitabine      To help prevent nausea and vomiting after your treatment, we encourage you to take your nausea medication as directed.  BELOW ARE SYMPTOMS THAT SHOULD BE REPORTED IMMEDIATELY: *FEVER GREATER THAN 100.4 F (38 C) OR HIGHER *CHILLS OR SWEATING *NAUSEA AND VOMITING THAT IS NOT CONTROLLED WITH YOUR NAUSEA MEDICATION *UNUSUAL SHORTNESS OF BREATH *UNUSUAL BRUISING OR BLEEDING *URINARY PROBLEMS (pain or burning when urinating, or frequent urination) *BOWEL PROBLEMS (unusual diarrhea, constipation, pain near the anus) TENDERNESS IN MOUTH AND THROAT WITH OR WITHOUT PRESENCE OF ULCERS (sore throat, sores in mouth, or a toothache) UNUSUAL RASH, SWELLING OR PAIN  UNUSUAL VAGINAL DISCHARGE OR ITCHING   Items with * indicate a potential emergency and should be followed up as soon as possible or go to the Emergency Department if any problems should occur.  Please show the CHEMOTHERAPY ALERT CARD or IMMUNOTHERAPY  ALERT CARD at check-in to the Emergency Department and triage nurse.  Should you have questions after your visit or need to cancel or reschedule your appointment, please contact [facility] - A [facility]  Dept: [phone]  and follow the prompts.  Office hours are 8:00 a.m. to 4:30 p.m. Monday - Friday. Please note that voicemails left after 4:00 p.m. may not be returned until the following business day.  We are closed weekends and major holidays. You have access to a nurse at all times for urgent questions. Please call the main number to the clinic Dept: [phone] and follow the prompts.   For any non-urgent questions, you may also contact your provider using MyChart. We now offer e-Visits for anyone 59 and older to request care online for non-urgent symptoms. For details visit [email or web address].   Also download the MyChart app! Go to the app store, search "MyChart", open the app, select [place], and log in with your MyChart username and password.

## 2023-02-26 NOTE — Assessment & Plan Note (Addendum)
Ok to treat today Change to every other week treatment Neutropenic precautions

## 2023-02-28 ENCOUNTER — Other Ambulatory Visit: Payer: Self-pay

## 2023-03-11 MED FILL — Fosaprepitant Dimeglumine For IV Infusion 150 MG (Base Eq): INTRAVENOUS | Qty: 5 | Status: AC

## 2023-03-12 ENCOUNTER — Inpatient Hospital Stay: Payer: Medicare HMO

## 2023-03-12 VITALS — BP 159/92 | HR 69 | Temp 98.1°F | Resp 17 | Ht 72.0 in | Wt 193.1 lb

## 2023-03-12 DIAGNOSIS — D708 Other neutropenia: Secondary | ICD-10-CM

## 2023-03-12 DIAGNOSIS — C689 Malignant neoplasm of urinary organ, unspecified: Secondary | ICD-10-CM

## 2023-03-12 DIAGNOSIS — Z5111 Encounter for antineoplastic chemotherapy: Secondary | ICD-10-CM | POA: Diagnosis not present

## 2023-03-12 LAB — CBC WITH DIFFERENTIAL (CANCER CENTER ONLY)
Abs Immature Granulocytes: 0 10*3/uL (ref 0.00–0.07)
Basophils Absolute: 0 10*3/uL (ref 0.0–0.1)
Basophils Relative: 1 %
Eosinophils Absolute: 0.1 10*3/uL (ref 0.0–0.5)
Eosinophils Relative: 3 %
HCT: 31.9 % — ABNORMAL LOW (ref 39.0–52.0)
Hemoglobin: 11.3 g/dL — ABNORMAL LOW (ref 13.0–17.0)
Immature Granulocytes: 0 %
Lymphocytes Relative: 33 %
Lymphs Abs: 1.1 10*3/uL (ref 0.7–4.0)
MCH: 33.7 pg (ref 26.0–34.0)
MCHC: 35.4 g/dL (ref 30.0–36.0)
MCV: 95.2 fL (ref 80.0–100.0)
Monocytes Absolute: 0.6 10*3/uL (ref 0.1–1.0)
Monocytes Relative: 19 %
Neutro Abs: 1.5 10*3/uL — ABNORMAL LOW (ref 1.7–7.7)
Neutrophils Relative %: 44 %
Platelet Count: 261 10*3/uL (ref 150–400)
RBC: 3.35 MIL/uL — ABNORMAL LOW (ref 4.22–5.81)
RDW: 15.9 % — ABNORMAL HIGH (ref 11.5–15.5)
WBC Count: 3.3 10*3/uL — ABNORMAL LOW (ref 4.0–10.5)
nRBC: 0 % (ref 0.0–0.2)

## 2023-03-12 LAB — CMP (CANCER CENTER ONLY)
ALT: 14 U/L (ref 0–44)
AST: 18 U/L (ref 15–41)
Albumin: 4.1 g/dL (ref 3.5–5.0)
Alkaline Phosphatase: 44 U/L (ref 38–126)
Anion gap: 6 (ref 5–15)
BUN: 8 mg/dL (ref 8–23)
CO2: 26 mmol/L (ref 22–32)
Calcium: 9 mg/dL (ref 8.9–10.3)
Chloride: 99 mmol/L (ref 98–111)
Creatinine: 0.86 mg/dL (ref 0.61–1.24)
GFR, Estimated: >60 mL/min (ref >60)
Glucose, Bld: 94 mg/dL (ref 70–99)
Potassium: 4.6 mmol/L (ref 3.5–5.1)
Sodium: 131 mmol/L — ABNORMAL LOW (ref 135–145)
Total Bilirubin: 0.3 mg/dL (ref <1.2)
Total Protein: 6.8 g/dL (ref 6.5–8.1)

## 2023-03-12 MED ORDER — SODIUM CHLORIDE 0.9 % IV SOLN: Freq: Once | INTRAVENOUS | Status: AC

## 2023-03-12 MED ORDER — DEXAMETHASONE SODIUM PHOSPHATE 10 MG/ML IJ SOLN
10.0000 mg | Freq: Once | INTRAMUSCULAR | Status: AC
  Administered 2023-03-12: 10 mg via INTRAVENOUS
  Filled 2023-03-12: qty 1

## 2023-03-12 MED ORDER — SODIUM CHLORIDE 0.9 % IV SOLN
1000.0000 mg/m2 | Freq: Once | INTRAVENOUS | Status: AC
  Administered 2023-03-12: 2014 mg via INTRAVENOUS
  Filled 2023-03-12: qty 52.97

## 2023-03-12 MED ORDER — SODIUM CHLORIDE 0.9 % IV SOLN
150.0000 mg | Freq: Once | INTRAVENOUS | Status: AC
  Administered 2023-03-12: 150 mg via INTRAVENOUS
  Filled 2023-03-12: qty 150

## 2023-03-12 MED ORDER — PALONOSETRON HCL INJECTION 0.25 MG/5ML
0.2500 mg | Freq: Once | INTRAVENOUS | Status: AC
  Administered 2023-03-12: 0.25 mg via INTRAVENOUS
  Filled 2023-03-12: qty 5

## 2023-03-12 MED ORDER — SODIUM CHLORIDE 0.9 % IV SOLN
420.8000 mg | Freq: Once | INTRAVENOUS | Status: AC
  Administered 2023-03-12: 420 mg via INTRAVENOUS
  Filled 2023-03-12: qty 42

## 2023-03-12 NOTE — Patient Instructions (Signed)
[facility] - A DEPT OF [facility]  Discharge Instructions: Thank you for choosing [facility] to provide your oncology and hematology care.   If you have a lab appointment with the Cancer Center, please go directly to the Cancer Center and check in at the registration area.   Wear comfortable clothing and clothing appropriate for easy access to any Portacath or PICC line.   We strive to give you quality time with your provider. You may need to reschedule your appointment if you arrive late (15 or more minutes).  Arriving late affects you and other patients whose appointments are after yours.  Also, if you miss three or more appointments without notifying the office, you may be dismissed from the clinic at the provider's discretion.      For prescription refill requests, have your pharmacy contact our office and allow 72 hours for refills to be completed.    Today you received the following chemotherapy and/or immunotherapy agents: gemcitabine and carboplatin      To help prevent nausea and vomiting after your treatment, we encourage you to take your nausea medication as directed.  BELOW ARE SYMPTOMS THAT SHOULD BE REPORTED IMMEDIATELY: *FEVER GREATER THAN 100.4 F (38 C) OR HIGHER *CHILLS OR SWEATING *NAUSEA AND VOMITING THAT IS NOT CONTROLLED WITH YOUR NAUSEA MEDICATION *UNUSUAL SHORTNESS OF BREATH *UNUSUAL BRUISING OR BLEEDING *URINARY PROBLEMS (pain or burning when urinating, or frequent urination) *BOWEL PROBLEMS (unusual diarrhea, constipation, pain near the anus) TENDERNESS IN MOUTH AND THROAT WITH OR WITHOUT PRESENCE OF ULCERS (sore throat, sores in mouth, or a toothache) UNUSUAL RASH, SWELLING OR PAIN  UNUSUAL VAGINAL DISCHARGE OR ITCHING   Items with * indicate a potential emergency and should be followed up as soon as possible or go to the Emergency Department if any problems should occur.  Please show the CHEMOTHERAPY ALERT CARD  or IMMUNOTHERAPY ALERT CARD at check-in to the Emergency Department and triage nurse.  Should you have questions after your visit or need to cancel or reschedule your appointment, please contact [facility] - A DEPT OF [facility]  Dept: [phone]  and follow the prompts.  Office hours are 8:00 a.m. to 4:30 p.m. Monday - Friday. Please note that voicemails left after 4:00 p.m. may not be returned until the following business day.  We are closed weekends and major holidays. You have access to a nurse at all times for urgent questions. Please call the main number to the clinic Dept: [phone] and follow the prompts.   For any non-urgent questions, you may also contact your provider using MyChart. We now offer e-Visits for anyone 37 and older to request care online for non-urgent symptoms. For details visit [email or web address].   Also download the MyChart app! Go to the app store, search "MyChart", open the app, select [place], and log in with your MyChart username and password.

## 2023-03-12 NOTE — Assessment & Plan Note (Signed)
Neutropenic precautions.  Avoid constipation. If developing signs of infection, fever, report to ED immediately

## 2023-03-12 NOTE — Progress Notes (Signed)
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 Phase III POUT started on 01/08/23. 01/08/23. Started gem/carbo every 3 weeks planned for 4 cycles. Gemcitabine days 1, 8 at 1000 mg/m2 Carboplatin day 1 AUC 4.5 C1D8 delayed due to profound neutropenia. Asymptomatic. C2D8 delayed due to neutropenia.   Doing well without infection.  Neutropenia recovered.  Given the recurrent cytopenia, will change treatment to day 1, 15 of every 28 days for remainder cycles.  Assessment & Plan: Urothelial carcinoma (HCC) Proceed with cycle 3 day 1 Carbo AUC 4 He would like to postpone next dose due to the holidays having a lot going on. Resume on 1/6  Neutropenia (HCC) Neutropenic precautions.  Avoid constipation. If developing signs of infection, fever, report to ED immediately   Return to see me after labs on 1/6 for pretreatment visit from 10 to 10:30. Schedule also for 04/15/23 with lab, my app, infusion and 04/29/23 again lab, my appt and infusion. 2/3 will be last infusion cycle.  The patient understands the plans discussed today and is in agreement with them.  He knows to contact our office if he develops concerns prior to his next appointment.  Melven Sartorius, MD  [facility] - A DEPT OF [facility] [address] [place] [address] Dept: [phone] Dept Fax: [phone]   No orders of the defined types were placed in this encounter.     CHIEF COMPLAINT:  CC: UC  Current Treatment:  gem/carbo  INTERVAL HISTORY:  Billy Hernandez is here today  for repeat clinical assessment.  Report some joint pain from legs to upper body for about 4 days after last treatment. It resolved. He feels fine now. He denies fevers or chills. His appetite is good.  No chest pain, coughing, short of breath, nausea, vomiting, stomach pain, bleeding, bloody stool, constipation, diarrhea, no hematuria or difficulty urinating, numbness or tingling.   I have reviewed the past medical history, past surgical history, social history and family history with the patient and they are unchanged from previous note.  ALLERGIES:  has no known allergies.  MEDICATIONS:  Current Outpatient Medications  Medication Sig Dispense Refill   busPIRone (BUSPAR) 10 MG tablet Take 10 mg by mouth 2 (two) times daily.     dexamethasone (DECADRON) 4 MG tablet Take 1 tablets (4mg ) by mouth daily starting the day after carboplatin for 3 days. Take with food 30 tablet 1   lisinopril (ZESTRIL) 10 MG tablet Take 10 mg by mouth daily. Started on 09/30/2022     ondansetron (ZOFRAN) 8 MG tablet Take 1 tablet (8 mg total) by mouth every 8 (eight) hours as needed for nausea or vomiting. Start on the third day after carboplatin. 30 tablet 1   prochlorperazine (COMPAZINE) 10 MG tablet Take 1 tablet (10 mg total) by mouth every 6 (six) hours as needed for nausea or vomiting. 30 tablet 1   sildenafil (VIAGRA) 100 MG tablet Take 50 mg by mouth daily as needed for erectile dysfunction.     tamsulosin (FLOMAX) 0.4 MG CAPS capsule Take 0.4 mg by mouth daily.  traZODone (DESYREL) 50 MG tablet Take 50 mg by mouth at bedtime.     No current facility-administered medications for this visit.    HISTORY OF PRESENT ILLNESS:   Oncology History  Urothelial carcinoma (HCC)  12/2018 Miscellaneous   Initially presented with hematuria. 12/2018 CT with 3.5 cm mass in the posterior right bladder.    01/2019 Surgery   TURBT  with gemcitabine for 3.5cm right trigonal tumor. Path: HG papillary urothelial  carcinoma.    04/2019 Procedure   Completed 6/6 BCG induction in  and completed 3 maintenance BCG cycles.    10/2020 Surgery   TURBT a small papillary lesion at the bladder neck/prostatic junction. Pathology: Noninvasive high grade papillary urothelial carcinoma. Did not undergo BCG following TURBT in 10/2020   09/25/2022 Procedure   Cystoscopy with tumor protruding from the right ureteral orifice with adjacent mass affect from the distal right ureter. No papillary lesions within the bladder itself.   10/24/2022 Surgery   TURBT  A. BLADDER LESION, RIGHT LATERAL WALL, BIOPSY:  Noninvasive high-grade urothelial carcinoma with early papillary formation  Muscularis propria (detrusor muscle) is not present   B. URETERAL TUMOR, RIGHT, BIOPSY:  Noninvasive high-grade urothelial carcinoma    11/12/2022 Initial Diagnosis   Urothelial carcinoma (HCC)   11/12/2022 Surgery   Robotic assisted right laparoscopic nephroureterectomy    A. 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, size 1.8 and 2.7 cm  The carcinoma invades the muscularis (pT2)  Lymphovascular invasion is identified  Urothelial carcinoma in situ is present at renal pelvis  All margins of resection are negative for carcinoma   RENAL PELVIS AND URETER:   Procedure: Nephroureterectomy  Specimen Laterality: Right  Tumor Site: Distal ureter  Histologic Type: Urothelial carcinoma  Histologic Grade: High grade  Tumor Extension: Muscularis  Lymphovascular Invasion: Identified  Margin status for invasive carcinoma: Negative  Margin status for carcinoma in situ/ Noninvasive papillary urothelial  carcinoma: Negative  Regional Lymph Nodes:          No Lymph Nodes Submitted or found: X          Number of Lymph Nodes Involved: NA          Size of Largest Nodal Metastatic Deposit/ specify site: NA          Number of Lymph Nodes Examined: NA  Distant Metastasis: NA  Pathologic Stage  Classification (pTNM, AJCC 8th Edition): pT2, pN  Pathologic Findings in Ipsilateral Nonneoplastic Renal Tissue: chronic  obstructive changes with prominent pyelonephritis    12/10/2022 Cancer Staging   Staging form: Renal Pelvis and Ureter, AJCC 8th Edition - Clinical: Stage Unknown (cT2, cNX, cM0) - Signed by Melven Sartorius, MD on 12/10/2022 Stage prefix: Initial diagnosis WHO/ISUP grade (low/high): High Grade Histologic grading system: 2 grade system   01/08/2023 -  Chemotherapy   Patient is on Treatment Plan : BLADDER Carboplatin D1 + Gemcitabine D1,8 q21d     01/08/2023 -  Chemotherapy   Patient is on Treatment Plan :  BLADDER Carboplatin D1 + Gemcitabine D1,8 q21d         REVIEW OF SYSTEMS:   All relevant systems were reviewed with the patient and are negative.   VITALS:  Blood pressure (!) 159/92, pulse 69, temperature 98.1 F (36.7 C), temperature source Temporal, resp. rate 17, height 6' (1.829 m), weight 193 lb 1.6 oz (87.6 kg), SpO2 100%.  Wt Readings from Last 3 Encounters:  03/12/23 193 lb 1.6 oz (  87.6 kg)  02/26/23 191 lb 11.2 oz (87 kg)  02/05/23 190 lb 9.6 oz (86.5 kg)    Body mass index is 26.19 kg/m.  Performance status (ECOG): 0 - Asymptomatic  PHYSICAL EXAM:   GENERAL:alert, no distress and comfortable SKIN: skin color normal, no rashes  EYES: normal, sclera clear OROPHARYNX: no exudate, no erythema    NECK: supple,  non-tender, without nodularity LYMPH:  no palpable cervical lymphadenopathy LUNGS: clear to auscultation with normal breathing effort.  No wheeze or rales HEART: regular rate & rhythm and no murmurs and no lower extremity edema ABDOMEN: abdomen soft, non-tender and nondistended Musculoskeletal: no edema NEURO: alert  LABORATORY DATA:  I have reviewed the data as listed    Component Value Date/Time   NA 131 (L) 03/12/2023 1144   K 4.6 03/12/2023 1144   CL 99 03/12/2023 1144   CO2 26 03/12/2023 1144   GLUCOSE 94 03/12/2023  1144   BUN 8 03/12/2023 1144   CREATININE 0.86 03/12/2023 1144   CALCIUM 9.0 03/12/2023 1144   PROT 6.8 03/12/2023 1144   ALBUMIN 4.1 03/12/2023 1144   AST 18 03/12/2023 1144   ALT 14 03/12/2023 1144   ALKPHOS 44 03/12/2023 1144   BILITOT 0.3 03/12/2023 1144   GFRNONAA >60 03/12/2023 1144   GFRAA >60 11/14/2017 0318    No results found for: "SPEP", "UPEP"  Lab Results  Component Value Date   WBC 3.3 (L) 03/12/2023   NEUTROABS 1.5 (L) 03/12/2023   HGB 11.3 (L) 03/12/2023   HCT 31.9 (L) 03/12/2023   MCV 95.2 03/12/2023   PLT 261 03/12/2023      Chemistry      Component Value Date/Time   NA 131 (L) 03/12/2023 1144   K 4.6 03/12/2023 1144   CL 99 03/12/2023 1144   CO2 26 03/12/2023 1144   BUN 8 03/12/2023 1144   CREATININE 0.86 03/12/2023 1144      Component Value Date/Time   CALCIUM 9.0 03/12/2023 1144   ALKPHOS 44 03/12/2023 1144   AST 18 03/12/2023 1144   ALT 14 03/12/2023 1144   BILITOT 0.3 03/12/2023 1144       RADIOGRAPHIC STUDIES: I have personally reviewed the radiological images as listed and agreed with the findings in the report. No results found.

## 2023-03-12 NOTE — Assessment & Plan Note (Signed)
Proceed with cycle 3 day 1 Carbo AUC 4 He would like to postpone next dose due to the holidays having a lot going on. Resume on 1/6

## 2023-03-14 ENCOUNTER — Other Ambulatory Visit: Payer: Self-pay

## 2023-03-15 ENCOUNTER — Other Ambulatory Visit: Payer: Self-pay

## 2023-03-31 NOTE — Progress Notes (Deleted)
 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 Phase III POUT started on 01/08/23. 01/08/23. Started gem/carbo every 3 weeks planned for 4 cycles. Gemcitabine  days 1, 8 at 1000 mg/m2 Carboplatin  day 1 AUC 4.5 C1D8 delayed due to profound neutropenia. Asymptomatic. C2D8 delayed due to neutropenia.   Completed C3D1. Doing well without infection.  Neutropenia recovered.  Given the recurrent cytopenia, will change treatment to day 1, 15 of every 28 days for remainder cycles.  Urothelial carcinoma (HCC) Proceed with cycle 3 day 15 Every other week due to persistent cytopenia Carbo AUC 4  At risk for side effect of medication Recurrent neutropenia Decreased dose of carbo Discussed neutropenic precautions. Continue to monitor with CBC each cycle  Drug-induced neutropenia (HCC) Ok to treat today Treatment at every other week Neutropenic precautions    The patient understands the plans discussed today and is in agreement with them.  He knows to contact our office if he develops concerns prior to his next appointment.  Pauletta BROCKS Tina, MD  [facility] - A DEPT OF [facility] [address] [place] [address] Dept: [phone] Dept Fax: [phone]   No orders of the defined types were placed in this encounter.     CHIEF COMPLAINT:  CC: ***  Current Treatment:  ***  INTERVAL HISTORY:  Billy Hernandez is here today for repeat clinical assessment. He denies fevers or chills. He denies pain. His appetite is good. His  weight {Weight change:10426}.  I have reviewed the past medical history, past surgical history, social history and family history with the patient and they are unchanged from previous note.  ALLERGIES:  has no known allergies.  MEDICATIONS:  Current Outpatient Medications  Medication Sig Dispense Refill   busPIRone  (BUSPAR ) 10 MG tablet Take 10 mg by mouth 2 (two) times daily.     dexamethasone  (DECADRON ) 4 MG tablet Take 1 tablets (4mg ) by mouth daily starting the day after carboplatin  for 3 days. Take with food 30 tablet 1   lisinopril  (ZESTRIL ) 10 MG tablet Take 10 mg by mouth daily. Started on 09/30/2022     ondansetron  (ZOFRAN ) 8 MG tablet Take 1 tablet (8 mg total) by mouth every 8 (eight) hours as needed for nausea or vomiting. Start on the third day after carboplatin . 30 tablet 1   prochlorperazine  (COMPAZINE ) 10 MG tablet Take 1 tablet (10 mg total) by mouth every 6 (six) hours as needed for nausea or vomiting. 30 tablet 1   sildenafil (VIAGRA) 100 MG tablet Take 50 mg by mouth daily as needed for erectile dysfunction.     tamsulosin (FLOMAX) 0.4 MG CAPS capsule Take 0.4 mg by mouth daily.     traZODone  (DESYREL ) 50 MG tablet Take 50 mg by mouth at bedtime.     No current facility-administered medications for this visit.    HISTORY OF PRESENT ILLNESS:   Oncology History  Urothelial carcinoma (HCC)  12/2018 Miscellaneous   Initially presented with hematuria. 12/2018 CT with 3.5 cm mass in the posterior right bladder.    01/2019 Surgery   TURBT  with gemcitabine  for 3.5cm right trigonal tumor. Path: HG papillary urothelial carcinoma.    04/2019 Procedure   Completed 6/6 BCG induction in  and completed 3 maintenance BCG cycles.    10/2020 Surgery   TURBT a small papillary lesion at the bladder neck/prostatic junction. Pathology: Noninvasive high grade papillary urothelial carcinoma. Did not undergo BCG following TURBT in 10/2020   09/25/2022 Procedure   Cystoscopy with tumor  protruding from the right ureteral orifice with adjacent mass affect from the distal right ureter. No papillary lesions within the bladder itself.   10/24/2022 Surgery   TURBT  A. BLADDER LESION, RIGHT LATERAL WALL, BIOPSY:  Noninvasive high-grade urothelial carcinoma with early papillary formation  Muscularis propria (detrusor muscle) is not present   B. URETERAL TUMOR, RIGHT, BIOPSY:  Noninvasive high-grade urothelial carcinoma    11/12/2022 Initial Diagnosis   Urothelial carcinoma (HCC)   11/12/2022 Surgery   Robotic assisted right laparoscopic nephroureterectomy    A. 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, size 1.8 and 2.7 cm  The carcinoma invades the muscularis (pT2)  Lymphovascular invasion is identified  Urothelial carcinoma in situ is present at renal pelvis  All margins of resection are negative for carcinoma   RENAL PELVIS AND URETER:   Procedure: Nephroureterectomy  Specimen Laterality: Right  Tumor Site: Distal ureter  Histologic Type: Urothelial carcinoma  Histologic Grade: High grade  Tumor Extension: Muscularis  Lymphovascular Invasion: Identified  Margin status for invasive carcinoma: Negative  Margin status for carcinoma in situ/ Noninvasive papillary urothelial  carcinoma: Negative  Regional Lymph Nodes:          No Lymph Nodes Submitted or found: X          Number of Lymph Nodes Involved: NA          Size of Largest Nodal Metastatic Deposit/ specify site: NA          Number of Lymph Nodes Examined: NA  Distant Metastasis: NA  Pathologic Stage Classification (pTNM, AJCC 8th Edition): pT2, pN  Pathologic Findings in Ipsilateral Nonneoplastic Renal Tissue: chronic  obstructive changes with prominent pyelonephritis    12/10/2022 Cancer Staging   Staging form: Renal Pelvis and Ureter, AJCC 8th Edition - Clinical: Stage Unknown (cT2, cNX, cM0) - Signed by Tina Pauletta BROCKS, MD on 12/10/2022 Stage prefix:  Initial diagnosis WHO/ISUP grade (low/high): High Grade Histologic grading system: 2 grade system   01/08/2023 -  Chemotherapy   Patient is on Treatment Plan : BLADDER Carboplatin  D1 + Gemcitabine  D1,8 q21d     01/08/2023 -  Chemotherapy   Patient is on Treatment Plan :  BLADDER Carboplatin  D1 + Gemcitabine  D1,8 q21d         REVIEW OF SYSTEMS:   All relevant systems were reviewed with the patient and are negative.   VITALS:  There were no vitals taken for this visit.  Wt Readings from Last 3 Encounters:  03/12/23 193 lb 1.6 oz (87.6 kg)  02/26/23 191 lb 11.2 oz (87 kg)  02/05/23 190 lb 9.6 oz (86.5 kg)    There is no height or weight on file to calculate BMI.  Performance status (ECOG): {CHL ONC [id number]  PHYSICAL EXAM:   GENERAL:alert, no distress and comfortable SKIN: skin color normal, no rashes  EYES: normal, sclera clear OROPHARYNX: no exudate, no erythema    NECK: supple,  non-tender, without nodularity LYMPH:  no palpable cervical lymphadenopathy LUNGS: clear to auscultation with normal breathing effort.  No wheeze or rales HEART: regular rate & rhythm and no murmurs and no lower extremity edema ABDOMEN: abdomen soft, non-tender and nondistended Musculoskeletal: no edema NEURO: alert, fluent speech, no focal motor/sensory deficits.  Strength and sensation equal bilaterally.  LABORATORY DATA:  I have reviewed the data as listed    Component Value Date/Time   NA 131 (L) 03/12/2023 1144   K 4.6 03/12/2023 1144   CL 99 03/12/2023 1144   CO2 26 03/12/2023 1144   GLUCOSE 94 03/12/2023 1144   BUN 8 03/12/2023 1144   CREATININE 0.86 03/12/2023 1144   CALCIUM 9.0 03/12/2023 1144   PROT 6.8 03/12/2023 1144   ALBUMIN  4.1 03/12/2023 1144   AST 18 03/12/2023 1144   ALT 14 03/12/2023 1144   ALKPHOS 44 03/12/2023 1144   BILITOT 0.3 03/12/2023 1144   GFRNONAA >60 03/12/2023 1144   GFRAA >60 11/14/2017 0318    No results found for: SPEP, UPEP  Lab  Results  Component Value Date   WBC 3.3 (L) 03/12/2023   NEUTROABS 1.5 (L) 03/12/2023   HGB 11.3 (L) 03/12/2023   HCT 31.9 (L) 03/12/2023   MCV 95.2 03/12/2023   PLT 261 03/12/2023      Chemistry      Component Value Date/Time   NA 131 (L) 03/12/2023 1144   K 4.6 03/12/2023 1144   CL 99 03/12/2023 1144   CO2 26 03/12/2023 1144   BUN 8 03/12/2023 1144   CREATININE 0.86 03/12/2023 1144      Component Value Date/Time   CALCIUM 9.0 03/12/2023 1144   ALKPHOS 44 03/12/2023 1144   AST 18 03/12/2023 1144   ALT 14 03/12/2023 1144   BILITOT 0.3 03/12/2023 1144       RADIOGRAPHIC STUDIES: I have personally reviewed the radiological images as listed and agreed with the findings in the report. No results found.

## 2023-04-01 ENCOUNTER — Inpatient Hospital Stay: Payer: Medicare HMO | Attending: Hematology and Oncology

## 2023-04-01 ENCOUNTER — Inpatient Hospital Stay: Payer: Medicare HMO

## 2023-04-01 NOTE — Assessment & Plan Note (Deleted)
 Ok to treat today Treatment at every other week Neutropenic precautions

## 2023-04-01 NOTE — Assessment & Plan Note (Deleted)
 Proceed with cycle 3 day 15 Every other week due to persistent cytopenia Carbo AUC 4

## 2023-04-01 NOTE — Assessment & Plan Note (Deleted)
 Recurrent neutropenia Decreased dose of carbo Discussed neutropenic precautions. Continue to monitor with CBC each cycle

## 2023-04-14 NOTE — Assessment & Plan Note (Deleted)
Will monitor with CBC before doses

## 2023-04-14 NOTE — Progress Notes (Deleted)
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 Phase III POUT started on 01/08/23. 01/08/23. Started gem/carbo every 3 weeks planned for 4 cycles. Gemcitabine days 1, 8 at 1000 mg/m2 Carboplatin day 1 AUC 4.5 C1D8 delayed due to profound neutropenia. Asymptomatic. C2D8 delayed due to neutropenia.   Doing well without infection. Given the recurrent cytopenia, will change treatment to day 1, 15 of every 28 days for remainder cycles.  No problem-specific Assessment & Plan notes found for this encounter.    The patient understands the plans discussed today and is in agreement with them.  He knows to contact our office if he develops concerns prior to his next appointment.  Melven Sartorius, MD  [facility] - A DEPT [facility] [address] [place] [address] Dept: [phone] Dept Fax: [phone]   No orders of the defined types were placed in this encounter.     CHIEF COMPLAINT:  CC: ***  Current Treatment:  ***  INTERVAL HISTORY:  Billy Hernandez is here today for repeat clinical assessment. He denies fevers or chills. He denies pain. His appetite is good. His weight {Weight change:10426}.  I have reviewed the past medical history, past surgical history, social history and family history with the patient and they are unchanged from previous note.  ALLERGIES:  has no known allergies.  MEDICATIONS:  Current Outpatient Medications  Medication Sig Dispense Refill   busPIRone (BUSPAR) 10 MG tablet Take 10 mg by  mouth 2 (two) times daily.     dexamethasone (DECADRON) 4 MG tablet Take 1 tablets (4mg ) by mouth daily starting the day after carboplatin for 3 days. Take with food 30 tablet 1   lisinopril (ZESTRIL) 10 MG tablet Take 10 mg by mouth daily. Started on 09/30/2022     ondansetron (ZOFRAN) 8 MG tablet Take 1 tablet (8 mg total) by mouth every 8 (eight) hours as needed for nausea or vomiting. Start on the third day after carboplatin. 30 tablet 1   prochlorperazine (COMPAZINE) 10 MG tablet Take 1 tablet (10 mg total) by mouth every 6 (six) hours as needed for nausea or vomiting. 30 tablet 1   sildenafil (VIAGRA) 100 MG tablet Take 50 mg by mouth daily as needed for erectile dysfunction.     tamsulosin (FLOMAX) 0.4 MG CAPS capsule Take 0.4 mg by mouth daily.     traZODone (DESYREL) 50 MG tablet Take 50 mg by mouth at bedtime.     No current facility-administered medications for this visit.    HISTORY OF PRESENT ILLNESS:   Oncology History  Urothelial carcinoma (HCC)  12/2018 Miscellaneous   Initially presented with hematuria. 12/2018 CT with 3.5 cm mass in the posterior right bladder.    01/2019 Surgery   TURBT  with gemcitabine for 3.5cm right trigonal tumor. Path: HG papillary urothelial carcinoma.    04/2019 Procedure   Completed 6/6 BCG induction in  and completed 3 maintenance BCG cycles.    10/2020 Surgery   TURBT a small papillary lesion at the bladder neck/prostatic junction. Pathology: Noninvasive high grade papillary  urothelial carcinoma. Did not undergo BCG following TURBT in 10/2020   09/25/2022 Procedure   Cystoscopy with tumor protruding from the right ureteral orifice with adjacent mass affect from the distal right ureter. No papillary lesions within the bladder itself.   10/24/2022 Surgery   TURBT  A. BLADDER LESION, RIGHT LATERAL WALL, BIOPSY:  Noninvasive high-grade urothelial carcinoma with early papillary formation  Muscularis propria (detrusor muscle) is not present   B.  URETERAL TUMOR, RIGHT, BIOPSY:  Noninvasive high-grade urothelial carcinoma    11/12/2022 Initial Diagnosis   Urothelial carcinoma (HCC)   11/12/2022 Surgery   Robotic assisted right laparoscopic nephroureterectomy    A. 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, size 1.8 and 2.7 cm  The carcinoma invades the muscularis (pT2)  Lymphovascular invasion is identified  Urothelial carcinoma in situ is present at renal pelvis  All margins of resection are negative for carcinoma   RENAL PELVIS AND URETER:   Procedure: Nephroureterectomy  Specimen Laterality: Right  Tumor Site: Distal ureter  Histologic Type: Urothelial carcinoma  Histologic Grade: High grade  Tumor Extension: Muscularis  Lymphovascular Invasion: Identified  Margin status for invasive carcinoma: Negative  Margin status for carcinoma in situ/ Noninvasive papillary urothelial  carcinoma: Negative  Regional Lymph Nodes:          No Lymph Nodes Submitted or found: X          Number of Lymph Nodes Involved: NA          Size of Largest Nodal Metastatic Deposit/ specify site: NA          Number of Lymph Nodes Examined: NA  Distant Metastasis: NA  Pathologic Stage Classification (pTNM, AJCC 8th Edition): pT2, pN  Pathologic Findings in Ipsilateral Nonneoplastic Renal Tissue: chronic  obstructive changes with prominent pyelonephritis    12/10/2022 Cancer Staging   Staging form: Renal Pelvis and Ureter, AJCC 8th Edition - Clinical: Stage Unknown (cT2, cNX, cM0) - Signed by Melven Sartorius, MD on 12/10/2022 Stage prefix: Initial diagnosis WHO/ISUP grade (low/high): High Grade Histologic grading system: 2 grade system   01/08/2023 -  Chemotherapy   Patient is on Treatment Plan : BLADDER Carboplatin D1 + Gemcitabine D1,8 q21d     01/08/2023 -  Chemotherapy   Patient is on Treatment Plan :  BLADDER Carboplatin D1 + Gemcitabine D1,8 q21d         REVIEW OF SYSTEMS:    All relevant systems were reviewed with the patient and are negative.   VITALS:  There were no vitals taken for this visit.  Wt Readings from Last 3 Encounters:  03/12/23 193 lb 1.6 oz (87.6 kg)  02/26/23 191 lb 11.2 oz (87 kg)  02/05/23 190 lb 9.6 oz (86.5 kg)    There is no height or weight on file to calculate BMI.  Performance status (ECOG): {CHL ONC [id number]  PHYSICAL EXAM:   GENERAL:alert, no distress and comfortable SKIN: skin color normal, no rashes  EYES: normal, sclera clear OROPHARYNX: no exudate, no erythema    NECK: supple,  non-tender, without nodularity LYMPH:  no palpable cervical lymphadenopathy LUNGS: clear to auscultation with normal breathing effort.  No wheeze or rales HEART: regular rate & rhythm and no murmurs and no lower extremity edema ABDOMEN: abdomen soft, non-tender and nondistended Musculoskeletal: no edema NEURO: alert, fluent speech, no focal motor/sensory deficits.  Strength and sensation equal bilaterally.  LABORATORY DATA:  I have reviewed the data as listed  Component Value Date/Time   NA 131 (L) 03/12/2023 1144   K 4.6 03/12/2023 1144   CL 99 03/12/2023 1144   CO2 26 03/12/2023 1144   GLUCOSE 94 03/12/2023 1144   BUN 8 03/12/2023 1144   CREATININE 0.86 03/12/2023 1144   CALCIUM 9.0 03/12/2023 1144   PROT 6.8 03/12/2023 1144   ALBUMIN 4.1 03/12/2023 1144   AST 18 03/12/2023 1144   ALT 14 03/12/2023 1144   ALKPHOS 44 03/12/2023 1144   BILITOT 0.3 03/12/2023 1144   GFRNONAA >60 03/12/2023 1144   GFRAA >60 11/14/2017 0318    No results found for: "SPEP", "UPEP"  Lab Results  Component Value Date   WBC 3.3 (L) 03/12/2023   NEUTROABS 1.5 (L) 03/12/2023   HGB 11.3 (L) 03/12/2023   HCT 31.9 (L) 03/12/2023   MCV 95.2 03/12/2023   PLT 261 03/12/2023      Chemistry      Component Value Date/Time   NA 131 (L) 03/12/2023 1144   K 4.6 03/12/2023 1144   CL 99 03/12/2023 1144   CO2 26 03/12/2023 1144   BUN 8  03/12/2023 1144   CREATININE 0.86 03/12/2023 1144      Component Value Date/Time   CALCIUM 9.0 03/12/2023 1144   ALKPHOS 44 03/12/2023 1144   AST 18 03/12/2023 1144   ALT 14 03/12/2023 1144   BILITOT 0.3 03/12/2023 1144       RADIOGRAPHIC STUDIES: I have personally reviewed the radiological images as listed and agreed with the findings in the report. No results found.

## 2023-04-14 NOTE — Assessment & Plan Note (Deleted)
Proceed with cycle 3 day 15 Every other week due to persistent cytopenia Carbo AUC 4

## 2023-04-15 ENCOUNTER — Inpatient Hospital Stay: Payer: Medicare HMO

## 2023-04-15 ENCOUNTER — Telehealth: Payer: Self-pay

## 2023-04-15 DIAGNOSIS — C689 Malignant neoplasm of urinary organ, unspecified: Secondary | ICD-10-CM

## 2023-04-15 DIAGNOSIS — D702 Other drug-induced agranulocytosis: Secondary | ICD-10-CM

## 2023-04-28 NOTE — Progress Notes (Unsigned)
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 Phase III POUT started on 01/08/23. 01/08/23. Started gem/carbo every 3 weeks planned for 4 cycles. Gemcitabine days 1, 8 at 1000 mg/m2 Carboplatin day 1 AUC 4.5 C1D8 delayed due to profound neutropenia. Asymptomatic. C2D8 delayed due to neutropenia.   He had an episode of fever led to his delaying therapy including holidays.  Now he is feeling well. Doing well without infection.  He feels comfortable continuing with treatment.  Urothelial carcinoma [facility]) Patient had delayed with holiday schedules  At risk for side effect of medication Recurrent neutropenia Decreased dose of carbo Discussed neutropenic precautions. Continue to monitor with CBC each cycle  Pulmonary nodules Last report a few pulmonary nodules in the left upper lobe. Will obtain CT chest with upcoming CT abdomen and pelvis   I will communicate with [facility] on compliance CT chest with abdomen and pelvis. The patient understands the plans discussed today and is in agreement with them.  He knows to contact our office if he develops concerns prior to his next appointment.  Melven Sartorius, MD  [facility] - [facility] [address] [place] [address] Dept: [phone] Dept Fax: [phone]   No orders of the defined types were placed in this encounter.     CHIEF COMPLAINT:  CC:  urothelial carcinoma of distal ureter  Current Treatment: carbo/gem  INTERVAL HISTORY:  Billy Hernandez  is here today for repeat clinical assessment.  Report a day of fever on the day of chemo so he canceled. No short of breath, coughing, stomach pain, diarrhea, urinary symptoms. He felt a few days later. He denies fevers or chills. He denies pain. His appetite is good. No trouble urinating. He drinks a lot of fluid.  I have reviewed the past medical history, past surgical history, social history and family history with the patient and they are unchanged from previous note.  ALLERGIES:  has no known allergies.  MEDICATIONS:  Current Outpatient Medications  Medication Sig Dispense Refill   busPIRone (BUSPAR) 10 MG tablet Take 10 mg by mouth 2 (two) times daily.     dexamethasone (DECADRON) 4 MG tablet Take 1 tablets (4mg ) by mouth daily starting the day after carboplatin for 3 days. Take with food 30 tablet 1   lisinopril (ZESTRIL) 10 MG tablet Take 10 mg by mouth daily. Started on 09/30/2022     ondansetron (ZOFRAN) 8 MG tablet Take 1 tablet (8 mg total) by mouth every 8 (eight) hours as needed for nausea or vomiting. Start on the third day after carboplatin. 30 tablet 1   prochlorperazine (COMPAZINE) 10 MG tablet Take 1 tablet (10 mg total) by mouth every 6 (six) hours as needed for nausea or vomiting. 30 tablet 1   sildenafil (VIAGRA) 100 MG tablet Take 50 mg by mouth daily as needed for erectile dysfunction.     tamsulosin (FLOMAX) 0.4 MG CAPS capsule Take 0.4 mg by mouth daily.     traZODone (DESYREL) 50 MG tablet Take 50 mg by mouth at bedtime.  No current facility-administered medications for this visit.    HISTORY OF PRESENT ILLNESS:   Oncology History  Urothelial carcinoma (HCC)  12/2018 Miscellaneous   Initially presented with hematuria. 12/2018 CT with 3.5 cm mass in the posterior right bladder.    01/2019 Surgery   TURBT  with gemcitabine for 3.5cm right trigonal tumor. Path: HG papillary urothelial carcinoma.    04/2019 Procedure   Completed 6/6 BCG induction in  and  completed 3 maintenance BCG cycles.    10/2020 Surgery   TURBT a small papillary lesion at the bladder neck/prostatic junction. Pathology: Noninvasive high grade papillary urothelial carcinoma. Did not undergo BCG following TURBT in 10/2020   09/25/2022 Procedure   Cystoscopy with tumor protruding from the right ureteral orifice with adjacent mass affect from the distal right ureter. No papillary lesions within the bladder itself.   10/24/2022 Surgery   TURBT  A. BLADDER LESION, RIGHT LATERAL WALL, BIOPSY:  Noninvasive high-grade urothelial carcinoma with early papillary formation  Muscularis propria (detrusor muscle) is not present   B. URETERAL TUMOR, RIGHT, BIOPSY:  Noninvasive high-grade urothelial carcinoma    11/12/2022 Initial Diagnosis   Urothelial carcinoma (HCC)   11/12/2022 Surgery   Robotic assisted right laparoscopic nephroureterectomy    A. 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, size 1.8 and 2.7 cm  The carcinoma invades the muscularis (pT2)  Lymphovascular invasion is identified  Urothelial carcinoma in situ is present at renal pelvis  All margins of resection are negative for carcinoma   RENAL PELVIS AND URETER:   Procedure: Nephroureterectomy  Specimen Laterality: Right  Tumor Site: Distal ureter  Histologic Type: Urothelial carcinoma  Histologic Grade: High grade  Tumor Extension: Muscularis  Lymphovascular Invasion: Identified  Margin status for invasive carcinoma: Negative  Margin status for carcinoma in situ/ Noninvasive papillary urothelial  carcinoma: Negative  Regional Lymph Nodes:          No Lymph Nodes Submitted or found: X          Number of Lymph Nodes Involved: NA          Size of Largest Nodal Metastatic Deposit/ specify site: NA          Number of Lymph Nodes Examined: NA  Distant Metastasis: NA  Pathologic Stage Classification (pTNM, AJCC 8th Edition): pT2, pN  Pathologic Findings in  Ipsilateral Nonneoplastic Renal Tissue: chronic  obstructive changes with prominent pyelonephritis    12/10/2022 Cancer Staging   Staging form: Renal Pelvis and Ureter, AJCC 8th Edition - Clinical: Stage Unknown (cT2, cNX, cM0) - Signed by Melven Sartorius, MD on 12/10/2022 Stage prefix: Initial diagnosis WHO/ISUP grade (low/high): High Grade Histologic grading system: 2 grade system   01/08/2023 -  Chemotherapy   Patient is on Treatment Plan : BLADDER Carboplatin D1 + Gemcitabine D1,8 q21d     01/08/2023 -  Chemotherapy   Patient is on Treatment Plan :  BLADDER Carboplatin D1 + Gemcitabine D1,8 q21d         REVIEW OF SYSTEMS:   All relevant systems were reviewed with the patient and are negative.   VITALS:  There were no vitals taken for this visit.  Wt Readings from Last 3 Encounters:  03/12/23 193 lb 1.6 oz (87.6 kg)  02/26/23 191 lb 11.2 oz (87 kg)  02/05/23 190 lb 9.6 oz (86.5 kg)    There is no height or weight on file to calculate BMI.  Performance status (ECOG): 0 -  Asymptomatic  PHYSICAL EXAM:   GENERAL:alert, no distress and comfortable SKIN: skin color normal, no rashes  EYES: normal, sclera clear OROPHARYNX: no exudate, no erythema    LUNGS: clear to auscultation with normal breathing effort.  No wheeze or rales HEART: regular rate & rhythm and no murmurs and no lower extremity edema ABDOMEN: abdomen soft, non-tender and nondistended Musculoskeletal: no edema   LABORATORY DATA:  I have reviewed the data as listed    Component Value Date/Time   NA 131 (L) 03/12/2023 1144   K 4.6 03/12/2023 1144   CL 99 03/12/2023 1144   CO2 26 03/12/2023 1144   GLUCOSE 94 03/12/2023 1144   BUN 8 03/12/2023 1144   CREATININE 0.86 03/12/2023 1144   CALCIUM 9.0 03/12/2023 1144   PROT 6.8 03/12/2023 1144   ALBUMIN 4.1 03/12/2023 1144   AST 18 03/12/2023 1144   ALT 14 03/12/2023 1144   ALKPHOS 44 03/12/2023 1144   BILITOT 0.3 03/12/2023 1144   GFRNONAA >60  03/12/2023 1144   GFRAA >60 11/14/2017 0318    No results found for: "SPEP", "UPEP"  Lab Results  Component Value Date   WBC 4.0 04/29/2023   NEUTROABS 2.4 04/29/2023   HGB 11.9 (L) 04/29/2023   HCT 33.1 (L) 04/29/2023   MCV 100.6 (H) 04/29/2023   PLT 271 04/29/2023      Chemistry      Component Value Date/Time   NA 131 (L) 03/12/2023 1144   K 4.6 03/12/2023 1144   CL 99 03/12/2023 1144   CO2 26 03/12/2023 1144   BUN 8 03/12/2023 1144   CREATININE 0.86 03/12/2023 1144      Component Value Date/Time   CALCIUM 9.0 03/12/2023 1144   ALKPHOS 44 03/12/2023 1144   AST 18 03/12/2023 1144   ALT 14 03/12/2023 1144   BILITOT 0.3 03/12/2023 1144       RADIOGRAPHIC STUDIES: I have personally reviewed the radiological images as listed and agreed with the findings in the report. No results found.

## 2023-04-28 NOTE — Assessment & Plan Note (Signed)
Patient had delayed with holiday schedules

## 2023-04-29 ENCOUNTER — Inpatient Hospital Stay: Payer: Medicare HMO

## 2023-04-29 ENCOUNTER — Inpatient Hospital Stay: Payer: Medicare HMO | Attending: Hematology and Oncology

## 2023-04-29 VITALS — BP 142/76 | HR 66 | Temp 98.0°F | Resp 16 | Wt 197.3 lb

## 2023-04-29 DIAGNOSIS — C689 Malignant neoplasm of urinary organ, unspecified: Secondary | ICD-10-CM

## 2023-04-29 DIAGNOSIS — R918 Other nonspecific abnormal finding of lung field: Secondary | ICD-10-CM

## 2023-04-29 DIAGNOSIS — Z79899 Other long term (current) drug therapy: Secondary | ICD-10-CM | POA: Diagnosis not present

## 2023-04-29 DIAGNOSIS — C679 Malignant neoplasm of bladder, unspecified: Secondary | ICD-10-CM | POA: Insufficient documentation

## 2023-04-29 DIAGNOSIS — Z9189 Other specified personal risk factors, not elsewhere classified: Secondary | ICD-10-CM

## 2023-04-29 DIAGNOSIS — Z5111 Encounter for antineoplastic chemotherapy: Secondary | ICD-10-CM | POA: Diagnosis present

## 2023-04-29 LAB — CMP (CANCER CENTER ONLY)
ALT: 14 U/L (ref 0–44)
AST: 19 U/L (ref 15–41)
Albumin: 4.1 g/dL (ref 3.5–5.0)
Alkaline Phosphatase: 50 U/L (ref 38–126)
Anion gap: 8 (ref 5–15)
BUN: 10 mg/dL (ref 8–23)
CO2: 24 mmol/L (ref 22–32)
Calcium: 8.9 mg/dL (ref 8.9–10.3)
Chloride: 92 mmol/L — ABNORMAL LOW (ref 98–111)
Creatinine: 0.87 mg/dL (ref 0.61–1.24)
GFR, Estimated: >60 mL/min (ref >60)
Glucose, Bld: 100 mg/dL — ABNORMAL HIGH (ref 70–99)
Potassium: 4.9 mmol/L (ref 3.5–5.1)
Sodium: 124 mmol/L — ABNORMAL LOW (ref 135–145)
Total Bilirubin: 0.5 mg/dL (ref 0.0–1.2)
Total Protein: 6.8 g/dL (ref 6.5–8.1)

## 2023-04-29 LAB — CBC WITH DIFFERENTIAL (CANCER CENTER ONLY)
Abs Immature Granulocytes: 0.01 10*3/uL (ref 0.00–0.07)
Basophils Absolute: 0 10*3/uL (ref 0.0–0.1)
Basophils Relative: 1 %
Eosinophils Absolute: 0.1 10*3/uL (ref 0.0–0.5)
Eosinophils Relative: 2 %
HCT: 33.1 % — ABNORMAL LOW (ref 39.0–52.0)
Hemoglobin: 11.9 g/dL — ABNORMAL LOW (ref 13.0–17.0)
Immature Granulocytes: 0 %
Lymphocytes Relative: 22 %
Lymphs Abs: 0.9 10*3/uL (ref 0.7–4.0)
MCH: 36.2 pg — ABNORMAL HIGH (ref 26.0–34.0)
MCHC: 36 g/dL (ref 30.0–36.0)
MCV: 100.6 fL — ABNORMAL HIGH (ref 80.0–100.0)
Monocytes Absolute: 0.6 10*3/uL (ref 0.1–1.0)
Monocytes Relative: 16 %
Neutro Abs: 2.4 10*3/uL (ref 1.7–7.7)
Neutrophils Relative %: 59 %
Platelet Count: 271 10*3/uL (ref 150–400)
RBC: 3.29 MIL/uL — ABNORMAL LOW (ref 4.22–5.81)
RDW: 11.7 % (ref 11.5–15.5)
WBC Count: 4 10*3/uL (ref 4.0–10.5)
nRBC: 0 % (ref 0.0–0.2)

## 2023-04-29 MED ORDER — HEPARIN SOD (PORK) LOCK FLUSH 100 UNIT/ML IV SOLN: 500.0000 [IU] | Freq: Once | INTRAVENOUS | Status: DC | PRN

## 2023-04-29 MED ORDER — SODIUM CHLORIDE 0.9 % IV SOLN: Freq: Once | INTRAVENOUS | Status: AC

## 2023-04-29 MED ORDER — SODIUM CHLORIDE 0.9 % IV SOLN
1000.0000 mg/m2 | Freq: Once | INTRAVENOUS | Status: AC
  Administered 2023-04-29: 2014 mg via INTRAVENOUS
  Filled 2023-04-29: qty 52.97

## 2023-04-29 MED ORDER — SODIUM CHLORIDE 0.9 % IV SOLN: Freq: Once | INTRAVENOUS | Status: DC

## 2023-04-29 MED ORDER — SODIUM CHLORIDE 0.9% FLUSH: 10.0000 mL | INTRAVENOUS | Status: DC | PRN

## 2023-04-29 MED ORDER — PROCHLORPERAZINE MALEATE 10 MG PO TABS
10.0000 mg | ORAL_TABLET | Freq: Once | ORAL | Status: AC
  Administered 2023-04-29: 10 mg via ORAL
  Filled 2023-04-29: qty 1

## 2023-04-29 NOTE — Patient Instructions (Signed)
 [facility]  Discharge Instructions: Thank you for choosing [facility] to provide your oncology and hematology care.   If you have a lab appointment with the Cancer Center, please go directly to the Cancer Center and check in at the registration area.   Wear comfortable clothing and clothing appropriate for easy access to any Portacath or PICC line.   We strive to give you quality time with your provider. You may need to reschedule your appointment if you arrive late (15 or more minutes).  Arriving late affects you and other patients whose appointments are after yours.  Also, if you miss three or more appointments without notifying the office, you may be dismissed from the clinic at the provider's discretion.      For prescription refill requests, have your pharmacy contact our office and allow 72 hours for refills to be completed.    Today you received the following chemotherapy and/or immunotherapy agents: Gemcitabine.       To help prevent nausea and vomiting after your treatment, we encourage you to take your nausea medication as directed.  BELOW ARE SYMPTOMS THAT SHOULD BE REPORTED IMMEDIATELY: *FEVER GREATER THAN 100.4 F (38 C) OR HIGHER *CHILLS OR SWEATING *NAUSEA AND VOMITING THAT IS NOT CONTROLLED WITH YOUR NAUSEA MEDICATION *UNUSUAL SHORTNESS OF BREATH *UNUSUAL BRUISING OR BLEEDING *URINARY PROBLEMS (pain or burning when urinating, or frequent urination) *BOWEL PROBLEMS (unusual diarrhea, constipation, pain near the anus) TENDERNESS IN MOUTH AND THROAT WITH OR WITHOUT PRESENCE OF ULCERS (sore throat, sores in mouth, or a toothache) UNUSUAL RASH, SWELLING OR PAIN  UNUSUAL VAGINAL DISCHARGE OR ITCHING   Items with * indicate a potential emergency and should be followed up as soon as possible or go to the Emergency Department if any problems should occur.  Please show the CHEMOTHERAPY ALERT CARD or  IMMUNOTHERAPY ALERT CARD at check-in to the Emergency Department and triage nurse.  Should you have questions after your visit or need to cancel or reschedule your appointment, please contact [facility] A [facility]  Dept: [phone]  and follow the prompts.  Office hours are 8:00 a.m. to 4:30 p.m. Monday - Friday. Please note that voicemails left after 4:00 p.m. may not be returned until the following business day.  We are closed weekends and major holidays. You have access to a nurse at all times for urgent questions. Please call the main number to the clinic Dept: [phone] and follow the prompts.   For any non-urgent questions, you may also contact your provider using MyChart. We now offer e-Visits for anyone 19 and older to request care online for non-urgent symptoms. For details visit [email or web address].   Also download the MyChart app! Go to the app store, search "MyChart", open the app, select [facility], and log in with your MyChart username and password.

## 2023-04-29 NOTE — Progress Notes (Signed)
Confirmed with Dr Cherly Hensen, today is cycle3 day 15

## 2023-04-29 NOTE — Assessment & Plan Note (Signed)
Last report a few pulmonary nodules in the left upper lobe. Will obtain CT chest with upcoming CT abdomen and pelvis

## 2023-04-29 NOTE — Assessment & Plan Note (Signed)
Recurrent neutropenia Decreased dose of carbo Discussed neutropenic precautions. Continue to monitor with CBC each cycle

## 2023-04-30 ENCOUNTER — Other Ambulatory Visit: Payer: Self-pay

## 2023-05-01 LAB — LAB REPORT - SCANNED: EGFR: 76.5

## 2023-05-06 ENCOUNTER — Other Ambulatory Visit: Payer: Self-pay

## 2023-05-10 MED FILL — Fosaprepitant Dimeglumine For IV Infusion 150 MG (Base Eq): INTRAVENOUS | Qty: 5 | Status: AC

## 2023-05-10 NOTE — Assessment & Plan Note (Signed)
Recurrent neutropenia Decreased dose of carbo Discussed neutropenic precautions. Continue to monitor with CBC each cycle

## 2023-05-10 NOTE — Assessment & Plan Note (Signed)
Patient had delayed with holiday schedules Will proceed with last cycle of chemo

## 2023-05-10 NOTE — Progress Notes (Unsigned)
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 Phase III POUT started on 01/08/23. 01/08/23. Started gem/carbo every 3 weeks planned for 4 cycles. Gemcitabine days 1, 8 at 1000 mg/m2 Carboplatin day 1 AUC 4.5 C1D8 delayed due to profound neutropenia. Asymptomatic. C2D8 delayed due to neutropenia.  Other delays due to holidays and patient preference.  Overall feeling well.  Doing well without infection.  He feels well continuing with treatment today.  He will be rescheduled for CT chest, abdomen and pelvis at [facility].  Will follow-up with results.  Urothelial carcinoma [facility]) Patient had delayed with holiday schedules Will proceed with last cycle of chemo  At risk for side effect of medication Recurrent neutropenia Decreased dose of carbo Discussed neutropenic precautions. Continue to monitor with CBC each cycle  Normocytic anemia Secondary to cancer chemotherapy. Repeat labs in 2 weeks  Neutropenia (HCC) Borderline neutropenia Discussed neutropenic precautions over the next 2 weeks. Repeat labs in 2 weeks.  Pulmonary nodules This will be follow-up with new upcoming CT scan    The patient understands the plans discussed today and is in agreement with them.  He knows to contact our office if he develops concerns prior to his next appointment.  Melven Sartorius, MD  [facility] - [facility] DEPT [facility] [id number] [place] [address] Dept: [phone]  CHIEF COMPLAINT:  CC: UTUC  Current  Treatment:  adjuvant gem/carbo  INTERVAL HISTORY:  Billy Hernandez is here today for repeat clinical assessment. He denies fevers or chills.  Overall he is feeling well.  Appetite is good.  No coughing, shortness of breath, swelling, chest pain, stomach pain, diarrhea, constipation, difficulty urinating.  I have reviewed the past medical history, past surgical history, social history and family history with the patient and they are unchanged from previous note.  ALLERGIES:  has no known allergies.  MEDICATIONS:  Current Outpatient Medications  Medication Sig Dispense Refill   busPIRone (BUSPAR) 10 MG tablet Take 10 mg by mouth 2 (two) times daily.     dexamethasone (DECADRON) 4 MG tablet Take 1 tablets (4mg ) by mouth daily starting the day after carboplatin for 3 days. Take with food 30 tablet 1   lisinopril (ZESTRIL) 10 MG tablet Take 10 mg by mouth daily. Started on 09/30/2022     ondansetron (ZOFRAN) 8 MG tablet Take 1 tablet (8 mg total) by mouth every 8 (eight) hours as needed for nausea or vomiting. Start on the third day after carboplatin. 30 tablet 1   prochlorperazine (COMPAZINE) 10 MG tablet Take 1 tablet (10 mg total) by mouth every 6 (six) hours as needed for nausea or vomiting. 30 tablet 1   sildenafil (VIAGRA) 100 MG tablet Take 50 mg by mouth daily as needed for erectile dysfunction.     tamsulosin (FLOMAX) 0.4 MG CAPS capsule Take 0.4 mg by mouth daily.     traZODone (DESYREL) 50 MG tablet Take 50 mg by mouth at bedtime.     No current facility-administered medications for this visit.    HISTORY OF PRESENT ILLNESS:  Oncology History  Urothelial carcinoma (HCC)  12/2018 Miscellaneous   Initially presented with hematuria. 12/2018 CT with 3.5 cm mass in the posterior right bladder.    01/2019 Surgery   TURBT  with gemcitabine for 3.5cm right trigonal tumor. Path: HG papillary urothelial carcinoma.    04/2019 Procedure   Completed 6/6 BCG induction in  and completed 3  maintenance BCG cycles.    10/2020 Surgery   TURBT a small papillary lesion at the bladder neck/prostatic junction. Pathology: Noninvasive high grade papillary urothelial carcinoma. Did not undergo BCG following TURBT in 10/2020   09/25/2022 Procedure   Cystoscopy with tumor protruding from the right ureteral orifice with adjacent mass affect from the distal right ureter. No papillary lesions within the bladder itself.   10/24/2022 Surgery   TURBT  A. BLADDER LESION, RIGHT LATERAL WALL, BIOPSY:  Noninvasive high-grade urothelial carcinoma with early papillary formation  Muscularis propria (detrusor muscle) is not present   B. URETERAL TUMOR, RIGHT, BIOPSY:  Noninvasive high-grade urothelial carcinoma    11/12/2022 Initial Diagnosis   Urothelial carcinoma (HCC)   11/12/2022 Surgery   Robotic assisted right laparoscopic nephroureterectomy    A. 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, size 1.8 and 2.7 cm  The carcinoma invades the muscularis (pT2)  Lymphovascular invasion is identified  Urothelial carcinoma in situ is present at renal pelvis  All margins of resection are negative for carcinoma   RENAL PELVIS AND URETER:   Procedure: Nephroureterectomy  Specimen Laterality: Right  Tumor Site: Distal ureter  Histologic Type: Urothelial carcinoma  Histologic Grade: High grade  Tumor Extension: Muscularis  Lymphovascular Invasion: Identified  Margin status for invasive carcinoma: Negative  Margin status for carcinoma in situ/ Noninvasive papillary urothelial  carcinoma: Negative  Regional Lymph Nodes:          No Lymph Nodes Submitted or found: X          Number of Lymph Nodes Involved: NA          Size of Largest Nodal Metastatic Deposit/ specify site: NA          Number of Lymph Nodes Examined: NA  Distant Metastasis: NA  Pathologic Stage Classification (pTNM, AJCC 8th Edition): pT2, pN  Pathologic Findings in Ipsilateral  Nonneoplastic Renal Tissue: chronic  obstructive changes with prominent pyelonephritis    12/10/2022 Cancer Staging   Staging form: Renal Pelvis and Ureter, AJCC 8th Edition - Clinical: Stage Unknown (cT2, cNX, cM0) - Signed by Melven Sartorius, MD on 12/10/2022 Stage prefix: Initial diagnosis WHO/ISUP grade (low/high): High Grade Histologic grading system: 2 grade system   01/08/2023 -  Chemotherapy   Patient is on Treatment Plan : BLADDER Carboplatin D1 + Gemcitabine D1,8 q21d     01/08/2023 -  Chemotherapy   Patient is on Treatment Plan :  BLADDER Carboplatin D1 + Gemcitabine D1,8 q21d         REVIEW OF SYSTEMS:   All relevant systems were reviewed with the patient and are negative.   VITALS:  There were no vitals taken for this visit.  Wt Readings from Last 3 Encounters:  04/29/23 197 lb 4.8 oz (89.5 kg)  03/12/23 193 lb 1.6 oz (87.6 kg)  02/26/23 191 lb 11.2 oz (87 kg)    There is no height or weight on file to calculate BMI.  Performance status (ECOG): 0 - Asymptomatic  PHYSICAL EXAM:   GENERAL:alert, no distress and comfortable SKIN: skin color normal, no  rashes  EYES: normal, sclera clear OROPHARYNX: no exudate, no erythema    LUNGS: clear to auscultation with normal breathing effort.  No wheeze or rales HEART: regular rate & rhythm and no murmurs and no lower extremity edema ABDOMEN: abdomen soft, non-tender and nondistended Musculoskeletal: no edema   LABORATORY DATA:  I have reviewed the data as listed    Component Value Date/Time   NA 124 (L) 04/29/2023 1155   K 4.9 04/29/2023 1155   CL 92 (L) 04/29/2023 1155   CO2 24 04/29/2023 1155   GLUCOSE 100 (H) 04/29/2023 1155   BUN 10 04/29/2023 1155   CREATININE 0.87 04/29/2023 1155   CALCIUM 8.9 04/29/2023 1155   PROT 6.8 04/29/2023 1155   ALBUMIN 4.1 04/29/2023 1155   AST 19 04/29/2023 1155   ALT 14 04/29/2023 1155   ALKPHOS 50 04/29/2023 1155   BILITOT 0.5 04/29/2023 1155   GFRNONAA >60 04/29/2023  1155   GFRAA >60 11/14/2017 0318    No results found for: "SPEP", "UPEP"  Lab Results  Component Value Date   WBC 3.4 (L) 05/13/2023   NEUTROABS 1.6 (L) 05/13/2023   HGB 12.2 (L) 05/13/2023   HCT 34.1 (L) 05/13/2023   MCV 98.8 05/13/2023   PLT 217 05/13/2023      Chemistry      Component Value Date/Time   NA 124 (L) 04/29/2023 1155   K 4.9 04/29/2023 1155   CL 92 (L) 04/29/2023 1155   CO2 24 04/29/2023 1155   BUN 10 04/29/2023 1155   CREATININE 0.87 04/29/2023 1155      Component Value Date/Time   CALCIUM 8.9 04/29/2023 1155   ALKPHOS 50 04/29/2023 1155   AST 19 04/29/2023 1155   ALT 14 04/29/2023 1155   BILITOT 0.5 04/29/2023 1155       RADIOGRAPHIC STUDIES: I have personally reviewed the radiological images as listed and agreed with the findings in the report. No results found.

## 2023-05-13 ENCOUNTER — Inpatient Hospital Stay: Payer: Medicare HMO

## 2023-05-13 VITALS — BP 158/89 | HR 63 | Temp 98.2°F | Resp 16

## 2023-05-13 DIAGNOSIS — R918 Other nonspecific abnormal finding of lung field: Secondary | ICD-10-CM

## 2023-05-13 DIAGNOSIS — C689 Malignant neoplasm of urinary organ, unspecified: Secondary | ICD-10-CM | POA: Diagnosis not present

## 2023-05-13 DIAGNOSIS — T451X5A Adverse effect of antineoplastic and immunosuppressive drugs, initial encounter: Secondary | ICD-10-CM

## 2023-05-13 DIAGNOSIS — D649 Anemia, unspecified: Secondary | ICD-10-CM | POA: Diagnosis not present

## 2023-05-13 DIAGNOSIS — D701 Agranulocytosis secondary to cancer chemotherapy: Secondary | ICD-10-CM | POA: Diagnosis not present

## 2023-05-13 DIAGNOSIS — Z5111 Encounter for antineoplastic chemotherapy: Secondary | ICD-10-CM | POA: Diagnosis not present

## 2023-05-13 DIAGNOSIS — Z9189 Other specified personal risk factors, not elsewhere classified: Secondary | ICD-10-CM

## 2023-05-13 LAB — CBC WITH DIFFERENTIAL (CANCER CENTER ONLY)
Abs Immature Granulocytes: 0.01 10*3/uL (ref 0.00–0.07)
Basophils Absolute: 0 10*3/uL (ref 0.0–0.1)
Basophils Relative: 1 %
Eosinophils Absolute: 0.1 10*3/uL (ref 0.0–0.5)
Eosinophils Relative: 3 %
HCT: 34.1 % — ABNORMAL LOW (ref 39.0–52.0)
Hemoglobin: 12.2 g/dL — ABNORMAL LOW (ref 13.0–17.0)
Immature Granulocytes: 0 %
Lymphocytes Relative: 32 %
Lymphs Abs: 1.1 10*3/uL (ref 0.7–4.0)
MCH: 35.4 pg — ABNORMAL HIGH (ref 26.0–34.0)
MCHC: 35.8 g/dL (ref 30.0–36.0)
MCV: 98.8 fL (ref 80.0–100.0)
Monocytes Absolute: 0.6 10*3/uL (ref 0.1–1.0)
Monocytes Relative: 17 %
Neutro Abs: 1.6 10*3/uL — ABNORMAL LOW (ref 1.7–7.7)
Neutrophils Relative %: 47 %
Platelet Count: 217 10*3/uL (ref 150–400)
RBC: 3.45 MIL/uL — ABNORMAL LOW (ref 4.22–5.81)
RDW: 11.5 % (ref 11.5–15.5)
WBC Count: 3.4 10*3/uL — ABNORMAL LOW (ref 4.0–10.5)
nRBC: 0 % (ref 0.0–0.2)

## 2023-05-13 LAB — CMP (CANCER CENTER ONLY)
ALT: 18 U/L (ref 0–44)
AST: 17 U/L (ref 15–41)
Albumin: 4.1 g/dL (ref 3.5–5.0)
Alkaline Phosphatase: 44 U/L (ref 38–126)
Anion gap: 6 (ref 5–15)
BUN: 9 mg/dL (ref 8–23)
CO2: 26 mmol/L (ref 22–32)
Calcium: 9 mg/dL (ref 8.9–10.3)
Chloride: 95 mmol/L — ABNORMAL LOW (ref 98–111)
Creatinine: 0.91 mg/dL (ref 0.61–1.24)
GFR, Estimated: >60 mL/min (ref >60)
Glucose, Bld: 120 mg/dL — ABNORMAL HIGH (ref 70–99)
Potassium: 4.3 mmol/L (ref 3.5–5.1)
Sodium: 127 mmol/L — ABNORMAL LOW (ref 135–145)
Total Bilirubin: 0.5 mg/dL (ref 0.0–1.2)
Total Protein: 6.8 g/dL (ref 6.5–8.1)

## 2023-05-13 MED ORDER — PALONOSETRON HCL INJECTION 0.25 MG/5ML
0.2500 mg | Freq: Once | INTRAVENOUS | Status: AC
  Administered 2023-05-13: 0.25 mg via INTRAVENOUS
  Filled 2023-05-13: qty 5

## 2023-05-13 MED ORDER — SODIUM CHLORIDE 0.9 % IV SOLN: Freq: Once | INTRAVENOUS | Status: AC

## 2023-05-13 MED ORDER — FOSAPREPITANT DIMEGLUMINE INJECTION 150 MG
150.0000 mg | Freq: Once | INTRAVENOUS | Status: AC
  Administered 2023-05-13: 150 mg via INTRAVENOUS
  Filled 2023-05-13: qty 5
  Filled 2023-05-13: qty 150
  Filled 2023-05-13: qty 5

## 2023-05-13 MED ORDER — SODIUM CHLORIDE 0.9 % IV SOLN
420.8000 mg | Freq: Once | INTRAVENOUS | Status: AC
  Administered 2023-05-13: 420 mg via INTRAVENOUS
  Filled 2023-05-13: qty 42

## 2023-05-13 MED ORDER — DEXAMETHASONE SODIUM PHOSPHATE 10 MG/ML IJ SOLN
10.0000 mg | Freq: Once | INTRAMUSCULAR | Status: AC
Start: 2023-05-13 — End: 2023-05-13
  Administered 2023-05-13: 10 mg via INTRAVENOUS
  Filled 2023-05-13: qty 1

## 2023-05-13 MED ORDER — SODIUM CHLORIDE 0.9 % IV SOLN
1000.0000 mg/m2 | Freq: Once | INTRAVENOUS | Status: AC
  Administered 2023-05-13: 2014 mg via INTRAVENOUS
  Filled 2023-05-13: qty 52.97

## 2023-05-13 NOTE — Assessment & Plan Note (Signed)
This will be follow-up with new upcoming CT scan

## 2023-05-13 NOTE — Assessment & Plan Note (Signed)
Secondary to cancer chemotherapy. Repeat labs in 2 weeks

## 2023-05-13 NOTE — Assessment & Plan Note (Signed)
Borderline neutropenia Discussed neutropenic precautions over the next 2 weeks. Repeat labs in 2 weeks.

## 2023-05-13 NOTE — Patient Instructions (Signed)
 [facility] - [facility]  Discharge Instructions: Thank you for choosing [facility] to provide your oncology and hematology care.   If you have a lab appointment with the Cancer Center, please go directly to the Cancer Center and check in at the registration area.   Wear comfortable clothing and clothing appropriate for easy access to any Portacath or PICC line.   We strive to give you quality time with your provider. You may need to reschedule your appointment if you arrive late (15 or more minutes).  Arriving late affects you and other patients whose appointments are after yours.  Also, if you miss three or more appointments without notifying the office, you may be dismissed from the clinic at the provider's discretion.      For prescription refill requests, have your pharmacy contact our office and allow 72 hours for refills to be completed.    Today you received the following chemotherapy and/or immunotherapy agents: gemcitabine and carboplatin      To help prevent nausea and vomiting after your treatment, we encourage you to take your nausea medication as directed.  BELOW ARE SYMPTOMS THAT SHOULD BE REPORTED IMMEDIATELY: *FEVER GREATER THAN 100.4 F (38 C) OR HIGHER *CHILLS OR SWEATING *NAUSEA AND VOMITING THAT IS NOT CONTROLLED WITH YOUR NAUSEA MEDICATION *UNUSUAL SHORTNESS OF BREATH *UNUSUAL BRUISING OR BLEEDING *URINARY PROBLEMS (pain or burning when urinating, or frequent urination) *BOWEL PROBLEMS (unusual diarrhea, constipation, pain near the anus) TENDERNESS IN MOUTH AND THROAT WITH OR WITHOUT PRESENCE OF ULCERS (sore throat, sores in mouth, or a toothache) UNUSUAL RASH, SWELLING OR PAIN  UNUSUAL VAGINAL DISCHARGE OR ITCHING   Items with * indicate a potential emergency and should be followed up as soon as possible or go to the Emergency Department if any problems should occur.  Please show the CHEMOTHERAPY ALERT CARD  or IMMUNOTHERAPY ALERT CARD at check-in to the Emergency Department and triage nurse.  Should you have questions after your visit or need to cancel or reschedule your appointment, please contact [facility] - A DEPT OF [facility]  Dept: [phone]  and follow the prompts.  Office hours are 8:00 a.m. to 4:30 p.m. Monday - Friday. Please note that voicemails left after 4:00 p.m. may not be returned until the following business day.  We are closed weekends and major holidays. You have access to a nurse at all times for urgent questions. Please call the main number to the clinic Dept: [phone] and follow the prompts.   For any non-urgent questions, you may also contact your provider using MyChart. We now offer e-Visits for anyone 37 and older to request care online for non-urgent symptoms. For details visit [email or web address].   Also download the MyChart app! Go to the app store, search "MyChart", open the app, select [place], and log in with your MyChart username and password.

## 2023-05-22 ENCOUNTER — Other Ambulatory Visit: Payer: Self-pay

## 2023-05-27 ENCOUNTER — Inpatient Hospital Stay: Payer: Medicare HMO | Attending: Hematology and Oncology

## 2023-05-27 ENCOUNTER — Inpatient Hospital Stay: Payer: Medicare HMO

## 2023-05-27 VITALS — BP 153/81 | HR 91 | Temp 97.3°F | Resp 16 | Wt 193.0 lb

## 2023-05-27 DIAGNOSIS — C679 Malignant neoplasm of bladder, unspecified: Secondary | ICD-10-CM | POA: Diagnosis present

## 2023-05-27 DIAGNOSIS — C689 Malignant neoplasm of urinary organ, unspecified: Secondary | ICD-10-CM | POA: Diagnosis not present

## 2023-05-27 DIAGNOSIS — T451X5A Adverse effect of antineoplastic and immunosuppressive drugs, initial encounter: Secondary | ICD-10-CM | POA: Diagnosis not present

## 2023-05-27 DIAGNOSIS — D696 Thrombocytopenia, unspecified: Secondary | ICD-10-CM | POA: Diagnosis not present

## 2023-05-27 DIAGNOSIS — Z5111 Encounter for antineoplastic chemotherapy: Secondary | ICD-10-CM | POA: Insufficient documentation

## 2023-05-27 DIAGNOSIS — Z9189 Other specified personal risk factors, not elsewhere classified: Secondary | ICD-10-CM

## 2023-05-27 DIAGNOSIS — D701 Agranulocytosis secondary to cancer chemotherapy: Secondary | ICD-10-CM | POA: Diagnosis not present

## 2023-05-27 DIAGNOSIS — Z79899 Other long term (current) drug therapy: Secondary | ICD-10-CM | POA: Diagnosis not present

## 2023-05-27 LAB — CBC WITH DIFFERENTIAL (CANCER CENTER ONLY)
Abs Immature Granulocytes: 0.01 10*3/uL (ref 0.00–0.07)
Basophils Absolute: 0 10*3/uL (ref 0.0–0.1)
Basophils Relative: 0 %
Eosinophils Absolute: 0.1 10*3/uL (ref 0.0–0.5)
Eosinophils Relative: 2 %
HCT: 31.4 % — ABNORMAL LOW (ref 39.0–52.0)
Hemoglobin: 11.4 g/dL — ABNORMAL LOW (ref 13.0–17.0)
Immature Granulocytes: 0 %
Lymphocytes Relative: 33 %
Lymphs Abs: 0.9 10*3/uL (ref 0.7–4.0)
MCH: 35.3 pg — ABNORMAL HIGH (ref 26.0–34.0)
MCHC: 36.3 g/dL — ABNORMAL HIGH (ref 30.0–36.0)
MCV: 97.2 fL (ref 80.0–100.0)
Monocytes Absolute: 0.5 10*3/uL (ref 0.1–1.0)
Monocytes Relative: 19 %
Neutro Abs: 1.3 10*3/uL — ABNORMAL LOW (ref 1.7–7.7)
Neutrophils Relative %: 46 %
Platelet Count: 112 10*3/uL — ABNORMAL LOW (ref 150–400)
RBC: 3.23 MIL/uL — ABNORMAL LOW (ref 4.22–5.81)
RDW: 11.4 % — ABNORMAL LOW (ref 11.5–15.5)
WBC Count: 2.7 10*3/uL — ABNORMAL LOW (ref 4.0–10.5)
nRBC: 0 % (ref 0.0–0.2)

## 2023-05-27 LAB — CMP (CANCER CENTER ONLY)
ALT: 17 U/L (ref 0–44)
AST: 20 U/L (ref 15–41)
Albumin: 4.1 g/dL (ref 3.5–5.0)
Alkaline Phosphatase: 48 U/L (ref 38–126)
Anion gap: 6 (ref 5–15)
BUN: 9 mg/dL (ref 8–23)
CO2: 27 mmol/L (ref 22–32)
Calcium: 8.9 mg/dL (ref 8.9–10.3)
Chloride: 97 mmol/L — ABNORMAL LOW (ref 98–111)
Creatinine: 0.85 mg/dL (ref 0.61–1.24)
GFR, Estimated: >60 mL/min (ref >60)
Glucose, Bld: 113 mg/dL — ABNORMAL HIGH (ref 70–99)
Potassium: 4.3 mmol/L (ref 3.5–5.1)
Sodium: 130 mmol/L — ABNORMAL LOW (ref 135–145)
Total Bilirubin: 0.4 mg/dL (ref 0.0–1.2)
Total Protein: 6.9 g/dL (ref 6.5–8.1)

## 2023-05-27 MED ORDER — PROCHLORPERAZINE MALEATE 10 MG PO TABS
10.0000 mg | ORAL_TABLET | Freq: Once | ORAL | Status: AC
  Administered 2023-05-27: 10 mg via ORAL
  Filled 2023-05-27: qty 1

## 2023-05-27 MED ORDER — SODIUM CHLORIDE 0.9 % IV SOLN: Freq: Once | INTRAVENOUS | Status: AC

## 2023-05-27 MED ORDER — SODIUM CHLORIDE 0.9 % IV SOLN
1000.0000 mg/m2 | Freq: Once | INTRAVENOUS | Status: AC
  Administered 2023-05-27: 2014 mg via INTRAVENOUS
  Filled 2023-05-27: qty 52.61

## 2023-05-27 NOTE — Assessment & Plan Note (Signed)
 Monitor for bleeding over the next 2 weeks.  Discussed precautions.

## 2023-05-27 NOTE — Assessment & Plan Note (Signed)
 Mild neutropenia and asymptomatic No fever & educated on precautions today

## 2023-05-27 NOTE — Progress Notes (Signed)
 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 Phase III POUT started on 01/08/23. 01/08/23. Started gem/carbo every 3 weeks planned for 4 cycles. Gemcitabine days 1, 8 at 1000 mg/m2 Carboplatin day 1 AUC 4.5 C1D8 delayed due to profound neutropenia. Asymptomatic. C2D8 delayed due to neutropenia.  Complicated by borderline neutropenia, thrombocytopenia and anemia.  Urothelial carcinoma (HCC) Patient feeling well to proceed with last dose of chemotherapy today.  Discussed neutropenic precaution, low platelet precaution from bleeding. Will proceed with last cycle of chemo  Neutropenia (HCC) Mild neutropenia and asymptomatic No fever & educated on precautions today  At risk for side effect of medication More thrombocytopenia and neutropenia today but not critical.  Patient feels well to proceed with treatment. Dose of carbo previously reduced Discussed neutropenic precautions.  Thrombocytopenia (HCC) Monitor for bleeding over the next 2 weeks.  Discussed precautions.   Will coordinate with follow-up in the future with Urology.  I recommend CT scan every 26-month during year 1 post treatment.  The patient understands the plans discussed today and is in agreement with them.  He knows to contact our office if he develops concerns prior to his next appointment.  Melven Sartorius, MD  [facility] - [facility] [address] [place] [address] Dept: [phone] Dept Fax: [phone]   No orders of the  defined types were placed in this encounter.     CHIEF COMPLAINT:  CC:  High-grade urothelial carcinoma of distal ureter  Current Treatment:  adjuvant carbo/gem  INTERVAL HISTORY:  Billy Hernandez is here today for repeat clinical assessment. He denies fevers or chills. His appetite is good. No trouable urinating and drinking. No stomach pain, coughing, chest pain, short of breath, bleeding. Slight constipation he takes stuff.  Report injection site pain for a week with burning and sensitive and finally settle down. No signs of infection.  I have reviewed the past medical history, past surgical history, social history and family history with the patient and they are unchanged from previous note.  ALLERGIES:  has no known allergies.  MEDICATIONS:  Current Outpatient Medications  Medication Sig Dispense Refill   busPIRone (BUSPAR) 10 MG tablet Take 10 mg by mouth 2 (two) times daily.     dexamethasone (DECADRON) 4 MG tablet Take 1 tablets (4mg ) by mouth daily starting the day after carboplatin for 3 days. Take with food 30 tablet 1   lisinopril (ZESTRIL) 10 MG tablet Take 10 mg by mouth daily. Started on 09/30/2022     ondansetron (ZOFRAN) 8 MG tablet Take 1 tablet (8 mg total) by mouth every 8 (eight) hours as needed for nausea or vomiting. Start on the third day after carboplatin. 30 tablet 1   prochlorperazine (COMPAZINE) 10 MG tablet Take 1 tablet (10 mg total) by mouth every 6 (six) hours as needed for nausea or vomiting. 30 tablet 1   sildenafil (VIAGRA) 100 MG tablet Take 50 mg by mouth daily as needed for erectile dysfunction.     tamsulosin (FLOMAX) 0.4 MG CAPS capsule Take 0.4 mg by mouth  daily.     traZODone (DESYREL) 50 MG tablet Take 50 mg by mouth at bedtime.     No current facility-administered medications for this visit.    HISTORY OF PRESENT ILLNESS:   Oncology History  Urothelial carcinoma (HCC)  12/2018 Miscellaneous   Initially presented with hematuria. 12/2018 CT with  3.5 cm mass in the posterior right bladder.    01/2019 Surgery   TURBT  with gemcitabine for 3.5cm right trigonal tumor. Path: HG papillary urothelial carcinoma.    04/2019 Procedure   Completed 6/6 BCG induction in  and completed 3 maintenance BCG cycles.    10/2020 Surgery   TURBT a small papillary lesion at the bladder neck/prostatic junction. Pathology: Noninvasive high grade papillary urothelial carcinoma. Did not undergo BCG following TURBT in 10/2020   09/25/2022 Procedure   Cystoscopy with tumor protruding from the right ureteral orifice with adjacent mass affect from the distal right ureter. No papillary lesions within the bladder itself.   10/24/2022 Surgery   TURBT  A. BLADDER LESION, RIGHT LATERAL WALL, BIOPSY:  Noninvasive high-grade urothelial carcinoma with early papillary formation  Muscularis propria (detrusor muscle) is not present   B. URETERAL TUMOR, RIGHT, BIOPSY:  Noninvasive high-grade urothelial carcinoma    11/12/2022 Initial Diagnosis   Urothelial carcinoma (HCC)   11/12/2022 Surgery   Robotic assisted right laparoscopic nephroureterectomy    A. 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, size 1.8 and 2.7 cm  The carcinoma invades the muscularis (pT2)  Lymphovascular invasion is identified  Urothelial carcinoma in situ is present at renal pelvis  All margins of resection are negative for carcinoma   RENAL PELVIS AND URETER:   Procedure: Nephroureterectomy  Specimen Laterality: Right  Tumor Site: Distal ureter  Histologic Type: Urothelial carcinoma  Histologic Grade: High grade  Tumor Extension: Muscularis  Lymphovascular Invasion: Identified  Margin status for invasive carcinoma: Negative  Margin status for carcinoma in situ/ Noninvasive papillary urothelial  carcinoma: Negative  Regional Lymph Nodes:          No Lymph Nodes Submitted or found: X          Number of Lymph Nodes Involved: NA           Size of Largest Nodal Metastatic Deposit/ specify site: NA          Number of Lymph Nodes Examined: NA  Distant Metastasis: NA  Pathologic Stage Classification (pTNM, AJCC 8th Edition): pT2, pN  Pathologic Findings in Ipsilateral Nonneoplastic Renal Tissue: chronic  obstructive changes with prominent pyelonephritis    12/10/2022 Cancer Staging   Staging form: Renal Pelvis and Ureter, AJCC 8th Edition - Clinical: Stage Unknown (cT2, cNX, cM0) - Signed by Melven Sartorius, MD on 12/10/2022 Stage prefix: Initial diagnosis WHO/ISUP grade (low/high): High Grade Histologic grading system: 2 grade system   01/08/2023 -  Chemotherapy   Patient is on Treatment Plan : BLADDER Carboplatin D1 + Gemcitabine D1,8 q21d     01/08/2023 -  Chemotherapy   Patient is on Treatment Plan :  BLADDER Carboplatin D1 + Gemcitabine D1,8 q21d         REVIEW OF SYSTEMS:   All relevant systems were reviewed with the patient and are negative.   VITALS:  Blood pressure (!) 153/81, pulse 91, temperature (!) 97.3 F (36.3 C), temperature source Temporal, resp. rate 16, weight 193 lb (87.5 kg), SpO2 98%.  Wt Readings from Last 3 Encounters:  05/27/23 193 lb (87.5 kg)  04/29/23 197 lb 4.8 oz (89.5 kg)  03/12/23 193 lb 1.6 oz (87.6 kg)    Body mass index is 26.18 kg/m.  Performance status (ECOG): 0 - Asymptomatic  PHYSICAL EXAM:   GENERAL:alert, no distress and comfortable SKIN: skin color normal, no rashes  EYES: normal, sclera clear OROPHARYNX: no exudate, no erythema   LUNGS: clear to auscultation with normal breathing effort.  No wheeze or rales HEART: regular rate & rhythm and no murmurs and no lower extremity edema ABDOMEN: abdomen soft, non-tender and nondistended Musculoskeletal: no edema   LABORATORY DATA:  I have reviewed the data as listed    Component Value Date/Time   NA 130 (L) 05/27/2023 1102   K 4.3 05/27/2023 1102   CL 97 (L) 05/27/2023 1102   CO2 27 05/27/2023 1102   GLUCOSE  113 (H) 05/27/2023 1102   BUN 9 05/27/2023 1102   CREATININE 0.85 05/27/2023 1102   CALCIUM 8.9 05/27/2023 1102   PROT 6.9 05/27/2023 1102   ALBUMIN 4.1 05/27/2023 1102   AST 20 05/27/2023 1102   ALT 17 05/27/2023 1102   ALKPHOS 48 05/27/2023 1102   BILITOT 0.4 05/27/2023 1102   GFRNONAA >60 05/27/2023 1102   GFRAA >60 11/14/2017 0318    No results found for: "SPEP", "UPEP"  Lab Results  Component Value Date   WBC 2.7 (L) 05/27/2023   NEUTROABS 1.3 (L) 05/27/2023   HGB 11.4 (L) 05/27/2023   HCT 31.4 (L) 05/27/2023   MCV 97.2 05/27/2023   PLT 112 (L) 05/27/2023      Chemistry      Component Value Date/Time   NA 130 (L) 05/27/2023 1102   K 4.3 05/27/2023 1102   CL 97 (L) 05/27/2023 1102   CO2 27 05/27/2023 1102   BUN 9 05/27/2023 1102   CREATININE 0.85 05/27/2023 1102      Component Value Date/Time   CALCIUM 8.9 05/27/2023 1102   ALKPHOS 48 05/27/2023 1102   AST 20 05/27/2023 1102   ALT 17 05/27/2023 1102   BILITOT 0.4 05/27/2023 1102       RADIOGRAPHIC STUDIES: I have personally reviewed the radiological images as listed and agreed with the findings in the report. No results found.

## 2023-05-27 NOTE — Assessment & Plan Note (Signed)
 Patient feeling well to proceed with last dose of chemotherapy today.  Discussed neutropenic precaution, low platelet precaution from bleeding. Will proceed with last cycle of chemo

## 2023-05-27 NOTE — Assessment & Plan Note (Signed)
 More thrombocytopenia and neutropenia today but not critical.  Patient feels well to proceed with treatment. Dose of carbo previously reduced Discussed neutropenic precautions.

## 2023-05-27 NOTE — Progress Notes (Signed)
 Per Cherly Hensen, MD, okay to treat with Neutrophils 1.3

## 2023-05-27 NOTE — Patient Instructions (Signed)
 [facility] - A DEPT OF [facility]  Discharge Instructions: Thank you for choosing [facility] to provide your oncology and hematology care.   If you have a lab appointment with the Cancer Center, please go directly to the Cancer Center and check in at the registration area.   Wear comfortable clothing and clothing appropriate for easy access to any Portacath or PICC line.   We strive to give you quality time with your provider. You may need to reschedule your appointment if you arrive late (15 or more minutes).  Arriving late affects you and other patients whose appointments are after yours.  Also, if you miss three or more appointments without notifying the office, you may be dismissed from the clinic at the provider's discretion.      For prescription refill requests, have your pharmacy contact our office and allow 72 hours for refills to be completed.    Today you received the following chemotherapy and/or immunotherapy agents: Gemcitabine (Gemzar)      To help prevent nausea and vomiting after your treatment, we encourage you to take your nausea medication as directed.  BELOW ARE SYMPTOMS THAT SHOULD BE REPORTED IMMEDIATELY: *FEVER GREATER THAN 100.4 F (38 C) OR HIGHER *CHILLS OR SWEATING *NAUSEA AND VOMITING THAT IS NOT CONTROLLED WITH YOUR NAUSEA MEDICATION *UNUSUAL SHORTNESS OF BREATH *UNUSUAL BRUISING OR BLEEDING *URINARY PROBLEMS (pain or burning when urinating, or frequent urination) *BOWEL PROBLEMS (unusual diarrhea, constipation, pain near the anus) TENDERNESS IN MOUTH AND THROAT WITH OR WITHOUT PRESENCE OF ULCERS (sore throat, sores in mouth, or a toothache) UNUSUAL RASH, SWELLING OR PAIN  UNUSUAL VAGINAL DISCHARGE OR ITCHING   Items with * indicate a potential emergency and should be followed up as soon as possible or go to the Emergency Department if any problems should occur.  Please show the CHEMOTHERAPY ALERT CARD or  IMMUNOTHERAPY ALERT CARD at check-in to the Emergency Department and triage nurse.  Should you have questions after your visit or need to cancel or reschedule your appointment, please contact [facility] - A DEPT OF [facility]  Dept: [phone]  and follow the prompts.  Office hours are 8:00 a.m. to 4:30 p.m. Monday - Friday. Please note that voicemails left after 4:00 p.m. may not be returned until the following business day.  We are closed weekends and major holidays. You have access to a nurse at all times for urgent questions. Please call the main number to the clinic Dept: [phone] and follow the prompts.   For any non-urgent questions, you may also contact your provider using MyChart. We now offer e-Visits for anyone 93 and older to request care online for non-urgent symptoms. For details visit [email or web address].   Also download the MyChart app! Go to the app store, search "MyChart", open the app, select [place], and log in with your MyChart username and password.

## 2023-05-28 ENCOUNTER — Telehealth: Payer: Self-pay

## 2023-05-28 NOTE — Telephone Encounter (Signed)
 Marland Kitchen

## 2023-05-29 ENCOUNTER — Other Ambulatory Visit: Payer: Self-pay

## 2023-06-14 ENCOUNTER — Other Ambulatory Visit: Payer: Self-pay

## 2023-06-14 ENCOUNTER — Inpatient Hospital Stay
Admission: RE | Admit: 2023-06-14 | Discharge: 2023-06-14 | Disposition: A | Discharge status: Home / Self Care | Payer: Self-pay | Source: Ambulatory Visit

## 2023-06-14 DIAGNOSIS — C689 Malignant neoplasm of urinary organ, unspecified: Secondary | ICD-10-CM

## 2023-06-20 ENCOUNTER — Encounter: Payer: Self-pay | Admitting: Urology

## 2023-07-04 ENCOUNTER — Telehealth: Payer: Self-pay

## 2023-07-04 ENCOUNTER — Other Ambulatory Visit: Payer: Self-pay

## 2023-07-04 DIAGNOSIS — Z08 Encounter for follow-up examination after completed treatment for malignant neoplasm: Secondary | ICD-10-CM

## 2023-07-04 NOTE — Progress Notes (Signed)
 Outside imaging report of CT scan:    COMPARISON:  12/20/2022     FINDINGS:  CT CHEST FINDINGS     Cardiovascular: Aortic atherosclerosis. Normal heart size. No  pericardial effusion.     Mediastinum/Nodes: No enlarged mediastinal, hilar, or axillary lymph  nodes. Thyroid gland, trachea, and esophagus demonstrate no  significant findings.     Lungs/Pleura: Background of fine centrilobular nodularity throughout  the lungs. Minimal diffuse bilateral bronchial wall thickening. No  pleural effusion or pneumothorax.     Musculoskeletal: No chest wall abnormality. No acute osseous  findings.     CT ABDOMEN PELVIS FINDINGS     Hepatobiliary: No solid liver abnormality is seen. No gallstones,  gallbladder wall thickening, or biliary dilatation.     Pancreas: Unremarkable. No pancreatic ductal dilatation or  surrounding inflammatory changes.     Spleen: Normal in size without significant abnormality.     Adrenals/Urinary Tract: Adrenal glands are unremarkable. Status post  right nephroureterectomy. No suspicious soft tissue or contrast  enhancement in the nephrectomy bed. Left kidney is normal, without  renal calculi, solid lesion, or hydronephrosis. Right eccentric  urinary bladder wall thickening, new compared to prior examination  (series 5, image 114). No urinary tract filling defect on delayed  phase imaging.     Stomach/Bowel: Stomach is within normal limits. Appendix appears  normal. No evidence of bowel wall thickening, distention, or  inflammatory changes.     Vascular/Lymphatic: Aortic atherosclerosis. No enlarged abdominal or  pelvic lymph nodes.     Reproductive: No mass or other abnormality.     Other: No abdominal wall hernia or abnormality. No ascites.     Musculoskeletal: No acute osseous findings.     IMPRESSION:  1. Status post right nephroureterectomy. No suspicious soft tissue  or contrast enhancement in the nephrectomy bed.  2. Right eccentric  urinary bladder wall thickening, new compared to  prior examination. This may reflect local radiation therapy. No  focal lesion or suspicious contrast enhancement.  3. No evidence of lymphadenopathy or metastatic disease in the  chest, abdomen, or pelvis.  4. Smoking-related respiratory bronchiolitis.

## 2023-07-04 NOTE — Progress Notes (Signed)
 Please add lab to be done before appointment on 6/17. Thank you

## 2023-07-04 NOTE — Telephone Encounter (Signed)
 Patient scheduled appointments. Patient is aware of all appointment details.

## 2023-07-25 ENCOUNTER — Other Ambulatory Visit: Payer: Self-pay

## 2023-09-09 NOTE — Progress Notes (Deleted)
 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 Phase III POUT started on 01/08/23. 01/08/23. Started gem/carbo every 3 weeks and completed 4 cycles as of 05/27/23. Few delay due to neutropenia and personal reason.   Last CT NED in April 2025.   High risk NMIBC. Last cystoscopy NED in April 2025. Assessment & Plan   No orders of the defined types were placed in this encounter.    Lowanda Ruddy, MD  INTERVAL HISTORY: Patient returns for follow-up.  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 12/2018 CT with 3.5 cm mass in the posterior right bladder.    01/2019 Surgery   TURBT  with gemcitabine  for 3.5cm right trigonal tumor. Path: HG papillary urothelial carcinoma.    04/2019 Procedure   Completed 6/6 BCG induction in  and completed 3 maintenance BCG cycles.    10/2020 Surgery   TURBT a small papillary lesion at the bladder neck/prostatic junction. Pathology: Noninvasive high grade papillary urothelial carcinoma. Did not undergo BCG following TURBT in 10/2020   09/25/2022 Procedure   Cystoscopy with tumor protruding from the right ureteral orifice with adjacent mass affect from the distal right ureter. No papillary lesions within the bladder itself.   10/24/2022 Surgery   TURBT  A. BLADDER LESION, RIGHT LATERAL WALL, BIOPSY:  Noninvasive high-grade urothelial carcinoma with early papillary formation  Muscularis propria (detrusor muscle) is not present   B. URETERAL TUMOR, RIGHT, BIOPSY:  Noninvasive high-grade urothelial  carcinoma    11/12/2022 Initial Diagnosis   Urothelial carcinoma (HCC)   11/12/2022 Surgery   Robotic assisted right laparoscopic nephroureterectomy    A. 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, size 1.8 and 2.7 cm  The carcinoma invades the muscularis (pT2)  Lymphovascular invasion is identified  Urothelial carcinoma in situ is present at renal pelvis  All margins of resection are negative for carcinoma   RENAL PELVIS AND URETER:   Procedure: Nephroureterectomy  Specimen Laterality: Right  Tumor Site: Distal ureter  Histologic Type: Urothelial carcinoma  Histologic Grade: High grade  Tumor Extension: Muscularis  Lymphovascular Invasion: Identified  Margin status for invasive carcinoma: Negative  Margin status for carcinoma in situ/ Noninvasive papillary urothelial  carcinoma: Negative  Regional Lymph Nodes:          No Lymph Nodes Submitted or found: X          Number of Lymph Nodes Involved: NA          Size of Largest Nodal Metastatic Deposit/ specify site: NA          Number of Lymph Nodes Examined: NA  Distant Metastasis: NA  Pathologic Stage Classification (pTNM, AJCC 8th Edition): pT2, pN  Pathologic Findings in Ipsilateral Nonneoplastic Renal Tissue: chronic  obstructive changes with prominent pyelonephritis    12/10/2022 Cancer Staging   Staging form: Renal Pelvis and Ureter, AJCC 8th Edition - Clinical: Stage Unknown (cT2, cNX, cM0) - Signed by Lowanda Ruddy, MD on 12/10/2022 Stage prefix: Initial diagnosis WHO/ISUP grade (low/high): High Grade Histologic grading system: 2 grade system   01/08/2023 -  Chemotherapy   Completed 4 cycles with dose reduction due to cytopenia and delays Patient is on Treatment Plan : BLADDER Carboplatin  D1 + Gemcitabine  D1,8 q21d      02/07/2023 Procedure   cystoscopy showed well-healing closure around prior placement of right ureteral orifice. No evidence of any recurrent  papillary lesion.    06/2023 Imaging   CT CAP: NED. Small thickening of the right lateral wall, no focal lesion.  No metastatic disease.   06/2023 Procedure   06/2023 cystoscopy showed well-healing closure around prior placement of right ureteral orifice. No evidence of any recurrent papillary lesion.       PHYSICAL EXAMINATION: ECOG PERFORMANCE STATUS: {CHL ONC ECOG PS:[id number]}  There were no vitals filed for this visit. There were no vitals filed for this visit.  GENERAL: alert, no distress and comfortable SKIN: skin color normal and no bruising or petechiae or jaundice on exposed skin EYES: normal, sclera clear OROPHARYNX: no exudate  NECK: No palpable mass LYMPH:  no palpable cervical, axillary lymphadenopathy  LUNGS: clear to auscultation and percussion with normal breathing effort HEART: regular rate & rhythm  ABDOMEN: abdomen soft, non-tender and nondistended. Musculoskeletal: no edema NEURO: no focal motor/sensory deficits  Relevant data reviewed during this visit included ***

## 2023-09-10 ENCOUNTER — Inpatient Hospital Stay (HOSPITAL_BASED_OUTPATIENT_CLINIC_OR_DEPARTMENT_OTHER)

## 2023-09-10 ENCOUNTER — Inpatient Hospital Stay: Attending: Hematology and Oncology

## 2023-09-10 ENCOUNTER — Inpatient Hospital Stay

## 2023-09-10 VITALS — BP 161/87 | HR 64 | Temp 97.7°F | Resp 17 | Wt 195.5 lb

## 2023-09-10 DIAGNOSIS — Z8550 Personal history of malignant neoplasm of unspecified urinary tract organ: Secondary | ICD-10-CM | POA: Diagnosis not present

## 2023-09-10 DIAGNOSIS — Z08 Encounter for follow-up examination after completed treatment for malignant neoplasm: Secondary | ICD-10-CM

## 2023-09-10 DIAGNOSIS — C679 Malignant neoplasm of bladder, unspecified: Secondary | ICD-10-CM | POA: Diagnosis present

## 2023-09-10 LAB — CMP (CANCER CENTER ONLY)
ALT: 14 U/L (ref 0–44)
AST: 21 U/L (ref 15–41)
Albumin: 4.2 g/dL (ref 3.5–5.0)
Alkaline Phosphatase: 58 U/L (ref 38–126)
Anion gap: 7 (ref 5–15)
BUN: 12 mg/dL (ref 8–23)
CO2: 26 mmol/L (ref 22–32)
Calcium: 9.1 mg/dL (ref 8.9–10.3)
Chloride: 99 mmol/L (ref 98–111)
Creatinine: 1 mg/dL (ref 0.61–1.24)
GFR, Estimated: >60 mL/min (ref >60)
Glucose, Bld: 99 mg/dL (ref 70–99)
Potassium: 4.4 mmol/L (ref 3.5–5.1)
Sodium: 132 mmol/L — ABNORMAL LOW (ref 135–145)
Total Bilirubin: 0.5 mg/dL (ref 0.0–1.2)
Total Protein: 7.3 g/dL (ref 6.5–8.1)

## 2023-09-10 LAB — CBC WITH DIFFERENTIAL (CANCER CENTER ONLY)
Abs Immature Granulocytes: 0.01 10*3/uL (ref 0.00–0.07)
Basophils Absolute: 0 10*3/uL (ref 0.0–0.1)
Basophils Relative: 1 %
Eosinophils Absolute: 0.1 10*3/uL (ref 0.0–0.5)
Eosinophils Relative: 2 %
HCT: 37.5 % — ABNORMAL LOW (ref 39.0–52.0)
Hemoglobin: 13.6 g/dL (ref 13.0–17.0)
Immature Granulocytes: 0 %
Lymphocytes Relative: 28 %
Lymphs Abs: 1.1 10*3/uL (ref 0.7–4.0)
MCH: 33.7 pg (ref 26.0–34.0)
MCHC: 36.3 g/dL — ABNORMAL HIGH (ref 30.0–36.0)
MCV: 93.1 fL (ref 80.0–100.0)
Monocytes Absolute: 0.8 10*3/uL (ref 0.1–1.0)
Monocytes Relative: 18 %
Neutro Abs: 2.1 10*3/uL (ref 1.7–7.7)
Neutrophils Relative %: 51 %
Platelet Count: 245 10*3/uL (ref 150–400)
RBC: 4.03 MIL/uL — ABNORMAL LOW (ref 4.22–5.81)
RDW: 11.4 % — ABNORMAL LOW (ref 11.5–15.5)
WBC Count: 4.1 10*3/uL (ref 4.0–10.5)
nRBC: 0 % (ref 0.0–0.2)

## 2023-09-10 NOTE — Assessment & Plan Note (Addendum)
 Patient feeling well. Will continue surveillance cysto per Urology Follow up CT as planned at Urology's office. Follow up with me in Nov

## 2023-09-10 NOTE — Progress Notes (Signed)
 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 Phase III POUT started on 01/08/23. 01/08/23. Started gem/carbo every 3 weeks and completed 4 cycles as of 05/27/23. Few delay due to neutropenia and personal reason.   Last CT NED in April 2025.   High risk NMIBC. Last cystoscopy NED in April 2025. Assessment & Plan Encounter for follow-up surveillance of urothelial carcinoma of upper urinary tract Patient feeling well. Will continue surveillance cysto per Urology Follow up CT as planned at Urology's office. Follow up with me in Nov  Orders Placed This Encounter  Procedures   CBC with Differential (Cancer Center Only)    Standing Status:   Future    Expiration Date:   09/09/2024   CMP (Cancer Center only)    Standing Status:   Future    Expiration Date:   09/09/2024     Lowanda Ruddy, MD  INTERVAL HISTORY: Patient returns for follow-up. No trouble urinating, hematuria, pattern change.  No stomach pain, nausea, vomiting, diarrhea, bleeding, chest pain, coughing.  Unchanged short of breath.  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 12/2018 CT with 3.5 cm mass in the posterior right bladder.    01/2019 Surgery   TURBT  with gemcitabine  for 3.5cm right trigonal tumor. Path: HG papillary urothelial carcinoma.    04/2019 Procedure   Completed 6/6 BCG induction in  and completed 3 maintenance BCG cycles.    10/2020 Surgery   TURBT a small papillary lesion at the bladder neck/prostatic junction. Pathology: Noninvasive high  grade papillary urothelial carcinoma. Did not undergo BCG following TURBT in 10/2020   09/25/2022 Procedure   Cystoscopy with tumor protruding from the right ureteral orifice with adjacent mass affect from the distal right ureter. No papillary lesions within the bladder itself.   10/24/2022 Surgery   TURBT  A. BLADDER LESION, RIGHT LATERAL WALL, BIOPSY:  Noninvasive high-grade urothelial carcinoma with early papillary formation  Muscularis propria (detrusor muscle) is not present   B. URETERAL TUMOR, RIGHT, BIOPSY:  Noninvasive high-grade urothelial carcinoma    11/12/2022 Initial Diagnosis   Urothelial carcinoma (HCC)   11/12/2022 Surgery   Robotic assisted right laparoscopic nephroureterectomy    A. 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, size 1.8 and 2.7 cm  The carcinoma invades the muscularis (pT2)  Lymphovascular invasion is identified  Urothelial carcinoma in situ is present at renal pelvis  All margins of resection are negative for carcinoma   RENAL PELVIS AND URETER:   Procedure: Nephroureterectomy  Specimen Laterality: Right  Tumor Site: Distal ureter  Histologic Type: Urothelial carcinoma  Histologic Grade: High grade  Tumor Extension: Muscularis  Lymphovascular Invasion: Identified  Margin status for invasive carcinoma: Negative  Margin status for carcinoma in situ/ Noninvasive papillary urothelial  carcinoma: Negative  Regional Lymph Nodes:          No Lymph Nodes Submitted or found: X          Number of Lymph Nodes Involved: NA          Size of Largest Nodal Metastatic Deposit/ specify site: NA  Number of Lymph Nodes Examined: NA  Distant Metastasis: NA  Pathologic Stage Classification (pTNM, AJCC 8th Edition): pT2, pN  Pathologic Findings in Ipsilateral Nonneoplastic Renal Tissue: chronic  obstructive changes with prominent pyelonephritis    12/10/2022 Cancer Staging   Staging form: Renal Pelvis  and Ureter, AJCC 8th Edition - Clinical: Stage Unknown (cT2, cNX, cM0) - Signed by Lowanda Ruddy, MD on 12/10/2022 Stage prefix: Initial diagnosis WHO/ISUP grade (low/high): High Grade Histologic grading system: 2 grade system   12/20/2022 Imaging   CT CAP 1. Status post right nephroureterectomy with a small amount of nodular thickening along the right posterior aspect of the urinary bladder wall, possibly reflecting scarring/postsurgical change but would recommend correlation with cystoscopy. 2. No evidence of metastatic disease in the chest, abdomen or pelvis. 3.  Aortic Atherosclerosis (ICD10-I70.0).   01/08/2023 - 05/27/2023 Chemotherapy   BLADDER Carboplatin  D1 + Gemcitabine  D1,8 q21d      02/07/2023 Procedure   cystoscopy showed well-healing closure around prior placement of right ureteral orifice. No evidence of any recurrent papillary lesion.    06/2023 Imaging   CT CAP: NED. Small thickening of the right lateral wall, no focal lesion.  No metastatic disease.   06/2023 Procedure   06/2023 cystoscopy showed well-healing closure around prior placement of right ureteral orifice. No evidence of any recurrent papillary lesion.       PHYSICAL EXAMINATION: ECOG PERFORMANCE STATUS: 0 - Asymptomatic  Vitals:   09/10/23 1224  BP: (!) 161/87  Pulse: 64  Resp: 17  Temp: 97.7 F (36.5 C)  SpO2: 99%   Filed Weights   09/10/23 1224  Weight: 195 lb 8 oz (88.7 kg)    GENERAL: alert, no distress and comfortable SKIN: skin color normal and no jaundice  LYMPH:  no palpable cervical, axillary lymphadenopathy  LUNGS: clear to auscultation and percussion with normal breathing effort HEART: regular rate & rhythm  ABDOMEN: abdomen soft, non-tender and nondistended. Musculoskeletal: no edema  Relevant data reviewed during this visit included outside records, labs.

## 2023-09-12 ENCOUNTER — Other Ambulatory Visit: Payer: Self-pay

## 2023-09-13 ENCOUNTER — Other Ambulatory Visit: Payer: Self-pay

## 2023-09-17 ENCOUNTER — Other Ambulatory Visit: Payer: Self-pay

## 2023-11-02 ENCOUNTER — Other Ambulatory Visit: Payer: Self-pay | Admitting: Urology

## 2023-11-02 LAB — LAB REPORT - SCANNED: EGFR: 87

## 2023-11-04 ENCOUNTER — Other Ambulatory Visit: Payer: Self-pay | Admitting: Urology

## 2023-11-10 NOTE — Patient Instructions (Signed)
 SURGICAL WAITING ROOM VISITATION Patients having surgery or a procedure may have no more than 2 support people in the waiting area - these visitors may rotate in the visitor waiting room.   If the patient needs to stay at the hospital during part of their recovery, the visitor guidelines for inpatient rooms apply.  PRE-OP VISITATION  Pre-op nurse will coordinate an appropriate time for 1 support person to accompany the patient in pre-op.  This support person may not rotate.  This visitor will be contacted when the time is appropriate for the visitor to come back in the pre-op area.  Please refer to the [facility] website for the visitor guidelines for Inpatients (after your surgery is over and you are in a regular room).  You are not required to quarantine at this time prior to your surgery. However, you must do this: Hand Hygiene often Do NOT share personal items Notify your provider if you are in close contact with someone who has COVID or you develop fever 100.4 or greater, new onset of sneezing, cough, sore throat, shortness of breath or body aches.  If you test positive for Covid or have been in contact with anyone that has tested positive in the last 10 days please notify you surgeon.    Your procedure is scheduled on:  MONDAY  December 02, 2023  Report to [facility] Main Entrance: [address] entrance where the [employer] is available.   Report to admitting at: 1:30 PM  Call this number if you have any questions or problems the morning of surgery [phone]  DO NOT EAT OR DRINK ANYTHING AFTER MIDNIGHT THE NIGHT PRIOR TO YOUR SURGERY / PROCEDURE. You may have water  until 0900 the morning of surgery.   FOLLOW  ANY ADDITIONAL PRE OP INSTRUCTIONS YOU RECEIVED FROM YOUR SURGEON'S OFFICE!!!   Oral Hygiene is also important to reduce your risk of infection.        Remember - BRUSH YOUR TEETH THE MORNING OF SURGERY WITH YOUR REGULAR TOOTHPASTE  Do NOT smoke after Midnight  the night before surgery.  STOP TAKING all Vitamins, Herbs and supplements 1 week before your surgery.   Take ONLY these medicines the morning of surgery with A SIP OF WATER : Tamsulosin.   DO NOT TAKE Lisinopril  the morning of your surgery.                   You may not have any metal on your body including  jewelry, and body piercing  Do not wear lotions, powders, cologne, or deodorant  Men may shave face and neck.  Contacts, Hearing Aids, dentures or bridgework may not be worn into surgery. DENTURES WILL BE REMOVED PRIOR TO SURGERY PLEASE DO NOT APPLY Poly grip OR ADHESIVES!!!  Patients discharged on the day of surgery will not be allowed to drive home.  Someone NEEDS to stay with you for the first 24 hours after anesthesia.  Do not bring your home medications to the hospital. The Pharmacy will dispense medications listed on your medication list to you during your admission in the Hospital.  Please read over the following fact sheets you were given: IF YOU HAVE QUESTIONS ABOUT YOUR PRE-OP INSTRUCTIONS, PLEASE CALL [phone].   [place] - Preparing for Surgery Before surgery, you can play an important role.  Because skin is not sterile, your skin needs to be as free of germs as possible.  You can reduce the number of germs on your skin by washing  with CHG (chlorahexidine gluconate) soap before surgery.  CHG is an antiseptic cleaner which kills germs and bonds with the skin to continue killing germs even after washing. Please DO NOT use if you have an allergy to CHG or antibacterial soaps.  If your skin becomes reddened/irritated stop using the CHG and inform your nurse when you arrive at Short Stay. Do not shave (including legs and underarms) for at least 48 hours prior to the first CHG shower.  You may shave your face/neck.  Please follow these instructions carefully:  1.  Shower with CHG Soap the night before surgery and the  morning of surgery.  2.  If you choose to wash  your hair, wash your hair first as usual with your normal  shampoo.  3.  After you shampoo, rinse your hair and body thoroughly to remove the shampoo.                             4.  Use CHG as you would any other liquid soap.  You can apply chg directly to the skin and wash.  Gently with a scrungie or clean washcloth.  5.  Apply the CHG Soap to your body ONLY FROM THE NECK DOWN.   Do not use on face/ open                           Wound or open sores. Avoid contact with eyes, ears mouth and genitals (private parts).                       Wash face,  Genitals (private parts) with your normal soap.             6.  Wash thoroughly, paying special attention to the area where your  surgery  will be performed.  7.  Thoroughly rinse your body with warm water  from the neck down.  8.  DO NOT shower/wash with your normal soap after using and rinsing off the CHG Soap.            9.  Pat yourself dry with a clean towel.            10.  Wear clean pajamas.            11.  Place clean sheets on your bed the night of your first shower and do not  sleep with pets.  ON THE DAY OF SURGERY : Do not apply any lotions/deodorants the morning of surgery.  Please wear clean clothes to the hospital/surgery center.    FAILURE TO FOLLOW THESE INSTRUCTIONS MAY RESULT IN THE CANCELLATION OF YOUR SURGERY  PATIENT SIGNATURE_________________________________  NURSE SIGNATURE__________________________________  ________________________________________________________________________

## 2023-11-10 NOTE — Progress Notes (Signed)
 COVID Vaccine received:  []  No [x]  Yes Date of any COVID positive Test in last 90 days:  PCP - Redell Standing, MD  at [facility] ???  [phone] (Work)  Cardiologist - none Oncology-  Pauletta Chihuahua, MD   Chest x-ray -  EKG - [phone]  will repeat   Stress Test -  ECHO -  Cardiac Cath -  CT Coronary Calcium score:   Bowel Prep - [x]  No  []   Yes ______  Pacemaker / ICD device [x]  No []  Yes   Spinal Cord Stimulator:[x]  No []  Yes       History of Sleep Apnea? [x]  No []  Yes   CPAP used?- [x]  No []  Yes    Patient has: [x]  NO Hx DM   []  Pre-DM   []  DM1  []   DM2 Does the patient monitor blood sugar?   [x]  N/A   []  No []  Yes   Blood Thinner / Instructions:  None Aspirin Instructions: none  ERAS Protocol Ordered: [x]  No  []  Yes Patient is to be NPO after: 1330   H2O until 0900  Dental hx: []  Dentures:  []  N/A      []  Bridge or Partial:                   []  Loose or Damaged teeth:   Comments: Patient is adopted and doesn't know any family history  Activity level: Able to walk up 2 flights of stairs without becoming significantly short of breath or having chest pain?  []  No   []    Yes  Patient can perform ADLs without assistance. []  No   []   Yes  Anesthesia review: HTN, GERD, s/p right nephroureterectomy 11-02-22, Thrombocytopenia  Patient denies any S&S of respiratory illness or Covid - no shortness of breath, fever, cough or chest pain at PAT appointment.  Patient verbalized understanding and agreement to the Pre-Surgical Instructions that were given to them at this PAT appointment. Patient was also educated of the need to review these PAT instructions again prior to his/her surgery.I reviewed the appropriate phone numbers to call if they have any and questions or concerns.

## 2023-11-11 ENCOUNTER — Encounter (HOSPITAL_COMMUNITY): Payer: Self-pay

## 2023-11-11 ENCOUNTER — Encounter (HOSPITAL_COMMUNITY)
Admission: RE | Admit: 2023-11-11 | Discharge: 2023-11-11 | Disposition: A | Discharge status: Home / Self Care | Source: Ambulatory Visit | Attending: Urology | Admitting: Urology

## 2023-11-11 ENCOUNTER — Other Ambulatory Visit: Payer: Self-pay

## 2023-11-11 VITALS — BP 158/78 | HR 66 | Temp 98.6°F | Resp 14 | Ht 72.0 in | Wt 187.0 lb

## 2023-11-11 DIAGNOSIS — I10 Essential (primary) hypertension: Secondary | ICD-10-CM | POA: Diagnosis not present

## 2023-11-11 DIAGNOSIS — Z01818 Encounter for other preprocedural examination: Secondary | ICD-10-CM | POA: Diagnosis not present

## 2023-11-11 DIAGNOSIS — Z9221 Personal history of antineoplastic chemotherapy: Secondary | ICD-10-CM | POA: Diagnosis not present

## 2023-11-11 DIAGNOSIS — Z01810 Encounter for preprocedural cardiovascular examination: Secondary | ICD-10-CM | POA: Diagnosis present

## 2023-11-11 DIAGNOSIS — Z01812 Encounter for preprocedural laboratory examination: Secondary | ICD-10-CM | POA: Diagnosis present

## 2023-11-11 HISTORY — DX: Unspecified osteoarthritis, unspecified site: M19.90

## 2023-11-11 LAB — COMPREHENSIVE METABOLIC PANEL WITH GFR
ALT: 19 U/L (ref 0–44)
AST: 21 U/L (ref 15–41)
Albumin: 3.8 g/dL (ref 3.5–5.0)
Alkaline Phosphatase: 45 U/L (ref 38–126)
Anion gap: 10 (ref 5–15)
BUN: 10 mg/dL (ref 8–23)
CO2: 23 mmol/L (ref 22–32)
Calcium: 8.9 mg/dL (ref 8.9–10.3)
Chloride: 99 mmol/L (ref 98–111)
Creatinine, Ser: 0.97 mg/dL (ref 0.61–1.24)
GFR, Estimated: >60 mL/min (ref >60)
Glucose, Bld: 84 mg/dL (ref 70–99)
Potassium: 4 mmol/L (ref 3.5–5.1)
Sodium: 132 mmol/L — ABNORMAL LOW (ref 135–145)
Total Bilirubin: 0.8 mg/dL (ref 0.0–1.2)
Total Protein: 7.3 g/dL (ref 6.5–8.1)

## 2023-11-11 LAB — CBC
HCT: 37.5 % — ABNORMAL LOW (ref 39.0–52.0)
Hemoglobin: 12.8 g/dL — ABNORMAL LOW (ref 13.0–17.0)
MCH: 33.2 pg (ref 26.0–34.0)
MCHC: 34.1 g/dL (ref 30.0–36.0)
MCV: 97.4 fL (ref 80.0–100.0)
Platelets: 321 K/uL (ref 150–400)
RBC: 3.85 MIL/uL — ABNORMAL LOW (ref 4.22–5.81)
RDW: 12.6 % (ref 11.5–15.5)
WBC: 4.7 K/uL (ref 4.0–10.5)
nRBC: 0 % (ref 0.0–0.2)

## 2023-12-02 ENCOUNTER — Ambulatory Visit (HOSPITAL_BASED_OUTPATIENT_CLINIC_OR_DEPARTMENT_OTHER): Admitting: Anesthesiology

## 2023-12-02 ENCOUNTER — Other Ambulatory Visit: Payer: Self-pay

## 2023-12-02 ENCOUNTER — Ambulatory Visit (HOSPITAL_COMMUNITY)
Admission: RE | Admit: 2023-12-02 | Discharge: 2023-12-02 | Disposition: A | Discharge status: Home / Self Care | Attending: Urology | Admitting: Urology

## 2023-12-02 ENCOUNTER — Ambulatory Visit (HOSPITAL_COMMUNITY): Admitting: Anesthesiology

## 2023-12-02 ENCOUNTER — Encounter (HOSPITAL_COMMUNITY): Payer: Self-pay | Admitting: Urology

## 2023-12-02 ENCOUNTER — Encounter (HOSPITAL_COMMUNITY)
Admission: RE | Disposition: A | Discharge status: Home / Self Care | Payer: Self-pay | Source: Home / Self Care | Attending: Urology

## 2023-12-02 DIAGNOSIS — F419 Anxiety disorder, unspecified: Secondary | ICD-10-CM | POA: Diagnosis not present

## 2023-12-02 DIAGNOSIS — D649 Anemia, unspecified: Secondary | ICD-10-CM | POA: Diagnosis not present

## 2023-12-02 DIAGNOSIS — Z87891 Personal history of nicotine dependence: Secondary | ICD-10-CM | POA: Diagnosis not present

## 2023-12-02 DIAGNOSIS — I10 Essential (primary) hypertension: Secondary | ICD-10-CM | POA: Insufficient documentation

## 2023-12-02 DIAGNOSIS — M1990 Unspecified osteoarthritis, unspecified site: Secondary | ICD-10-CM | POA: Diagnosis not present

## 2023-12-02 DIAGNOSIS — C678 Malignant neoplasm of overlapping sites of bladder: Secondary | ICD-10-CM | POA: Insufficient documentation

## 2023-12-02 DIAGNOSIS — R310 Gross hematuria: Secondary | ICD-10-CM | POA: Insufficient documentation

## 2023-12-02 DIAGNOSIS — C679 Malignant neoplasm of bladder, unspecified: Secondary | ICD-10-CM | POA: Diagnosis not present

## 2023-12-02 DIAGNOSIS — K219 Gastro-esophageal reflux disease without esophagitis: Secondary | ICD-10-CM | POA: Diagnosis not present

## 2023-12-02 DIAGNOSIS — Z79899 Other long term (current) drug therapy: Secondary | ICD-10-CM | POA: Diagnosis not present

## 2023-12-02 SURGERY — TURBT, WITH CHEMOTHERAPEUTIC AGENT INSTILLATION INTO BLADDER
Anesthesia: General

## 2023-12-02 MED ORDER — FENTANYL CITRATE (PF) 100 MCG/2ML IJ SOLN
INTRAMUSCULAR | Status: DC | PRN
  Administered 2023-12-02 (×2): 50 ug via INTRAVENOUS

## 2023-12-02 MED ORDER — ROCURONIUM BROMIDE 10 MG/ML (PF) SYRINGE
PREFILLED_SYRINGE | INTRAVENOUS | Status: AC
  Filled 2023-12-02: qty 10

## 2023-12-02 MED ORDER — ROCURONIUM BROMIDE 10 MG/ML (PF) SYRINGE
PREFILLED_SYRINGE | INTRAVENOUS | Status: DC | PRN
  Administered 2023-12-02: 50 mg via INTRAVENOUS
  Administered 2023-12-02: 20 mg via INTRAVENOUS

## 2023-12-02 MED ORDER — SUGAMMADEX SODIUM 200 MG/2ML IV SOLN
INTRAVENOUS | Status: AC
  Filled 2023-12-02: qty 2

## 2023-12-02 MED ORDER — ORAL CARE MOUTH RINSE: 15.0000 mL | Freq: Once | OROMUCOSAL | Status: AC

## 2023-12-02 MED ORDER — ONDANSETRON HCL 4 MG/2ML IJ SOLN
INTRAMUSCULAR | Status: AC
  Filled 2023-12-02: qty 2

## 2023-12-02 MED ORDER — HYDRALAZINE HCL 20 MG/ML IJ SOLN
5.0000 mg | Freq: Once | INTRAMUSCULAR | Status: AC
Start: 2023-12-02 — End: 2023-12-02
  Administered 2023-12-02: 5 mg via INTRAVENOUS

## 2023-12-02 MED ORDER — ONDANSETRON HCL 4 MG/2ML IJ SOLN
INTRAMUSCULAR | Status: DC | PRN
Start: 2023-12-02 — End: 2023-12-02
  Administered 2023-12-02: 4 mg via INTRAVENOUS

## 2023-12-02 MED ORDER — LIDOCAINE HCL (CARDIAC) PF 100 MG/5ML IV SOSY
PREFILLED_SYRINGE | INTRAVENOUS | Status: DC | PRN
  Administered 2023-12-02: 40 mg via INTRAVENOUS

## 2023-12-02 MED ORDER — DROPERIDOL 2.5 MG/ML IJ SOLN: 0.6250 mg | Freq: Once | INTRAMUSCULAR | Status: DC | PRN

## 2023-12-02 MED ORDER — DEXAMETHASONE SODIUM PHOSPHATE 10 MG/ML IJ SOLN
INTRAMUSCULAR | Status: DC | PRN
  Administered 2023-12-02: 10 mg via INTRAVENOUS

## 2023-12-02 MED ORDER — PROPOFOL 10 MG/ML IV BOLUS
INTRAVENOUS | Status: DC | PRN
  Administered 2023-12-02: 150 mg via INTRAVENOUS

## 2023-12-02 MED ORDER — SUGAMMADEX SODIUM 200 MG/2ML IV SOLN
INTRAVENOUS | Status: DC | PRN
  Administered 2023-12-02: 200 mg via INTRAVENOUS

## 2023-12-02 MED ORDER — GEMCITABINE CHEMO FOR BLADDER INSTILLATION 2000 MG
2000.0000 mg | Freq: Once | INTRAVENOUS | Status: AC
  Administered 2023-12-02: 2000 mg via INTRAVESICAL
  Filled 2023-12-02: qty 2000

## 2023-12-02 MED ORDER — CEFAZOLIN SODIUM-DEXTROSE 2-4 GM/100ML-% IV SOLN
2.0000 g | INTRAVENOUS | Status: AC
  Administered 2023-12-02: 2 g via INTRAVENOUS
  Filled 2023-12-02: qty 100

## 2023-12-02 MED ORDER — OXYCODONE HCL 5 MG/5ML PO SOLN: 5.0000 mg | Freq: Once | ORAL | Status: DC | PRN

## 2023-12-02 MED ORDER — FENTANYL CITRATE PF 50 MCG/ML IJ SOSY: 25.0000 ug | PREFILLED_SYRINGE | INTRAMUSCULAR | Status: DC | PRN

## 2023-12-02 MED ORDER — HYDRALAZINE HCL 20 MG/ML IJ SOLN
INTRAMUSCULAR | Status: AC
  Filled 2023-12-02: qty 1

## 2023-12-02 MED ORDER — LACTATED RINGERS IV SOLN: INTRAVENOUS | Status: DC

## 2023-12-02 MED ORDER — LIDOCAINE HCL (PF) 2 % IJ SOLN
INTRAMUSCULAR | Status: AC
  Filled 2023-12-02: qty 5

## 2023-12-02 MED ORDER — ACETAMINOPHEN 10 MG/ML IV SOLN: 1000.0000 mg | Freq: Once | INTRAVENOUS | Status: DC | PRN

## 2023-12-02 MED ORDER — FENTANYL CITRATE (PF) 100 MCG/2ML IJ SOLN
INTRAMUSCULAR | Status: AC
  Filled 2023-12-02: qty 2

## 2023-12-02 MED ORDER — SODIUM CHLORIDE 0.9 % IR SOLN
Status: DC | PRN
  Administered 2023-12-02: 15000 mL via INTRAVESICAL

## 2023-12-02 MED ORDER — OXYCODONE-ACETAMINOPHEN 5-325 MG PO TABS: 1.0000 | ORAL_TABLET | ORAL | 0 refills | Status: DC | PRN

## 2023-12-02 MED ORDER — DEXAMETHASONE SODIUM PHOSPHATE 10 MG/ML IJ SOLN
INTRAMUSCULAR | Status: AC
  Filled 2023-12-02: qty 1

## 2023-12-02 MED ORDER — OXYCODONE HCL 5 MG PO TABS: 5.0000 mg | ORAL_TABLET | Freq: Once | ORAL | Status: DC | PRN

## 2023-12-02 MED ORDER — CHLORHEXIDINE GLUCONATE 0.12 % MT SOLN
15.0000 mL | Freq: Once | OROMUCOSAL | Status: AC
  Administered 2023-12-02: 15 mL via OROMUCOSAL

## 2023-12-02 SURGICAL SUPPLY — 16 items
BAG URINE DRAIN 2000ML AR STRL (UROLOGICAL SUPPLIES) IMPLANT
BAG URO CATCHER STRL LF (MISCELLANEOUS) ×1 IMPLANT
CATH FOLEY 2WAY SLVR 5CC 18FR (CATHETERS) IMPLANT
DRAPE FOOT SWITCH (DRAPES) ×1 IMPLANT
ELECT REM PT RETURN 15FT ADLT (MISCELLANEOUS) ×1 IMPLANT
EVACUATOR MICROVAS BLADDER (UROLOGICAL SUPPLIES) IMPLANT
GLOVE BIOGEL M 7.0 STRL (GLOVE) ×1 IMPLANT
GOWN STRL REUS W/ TWL XL LVL3 (GOWN DISPOSABLE) ×1 IMPLANT
HOLDER FOLEY CATH W/STRAP (MISCELLANEOUS) IMPLANT
KIT TURNOVER KIT A (KITS) ×1 IMPLANT
LOOP CUT BIPOLAR 24F LRG (ELECTROSURGICAL) IMPLANT
MANIFOLD NEPTUNE II (INSTRUMENTS) ×1 IMPLANT
PACK CYSTO (CUSTOM PROCEDURE TRAY) ×1 IMPLANT
SYRINGE TOOMEY IRRIG 70ML (MISCELLANEOUS) IMPLANT
TUBING CONNECTING 10 (TUBING) ×1 IMPLANT
TUBING UROLOGY SET (TUBING) ×1 IMPLANT

## 2023-12-02 NOTE — H&P (Signed)
 Office Visit Report     11/01/2023   --------------------------------------------------------------------------------   Billy Hernandez. Billy Hernandez  MRN: [id number]  DOB: 1955-10-01, 68 year old Male  SSN:    PRIMARY CARE:  Ahunna N. Benita Caraway, MD  PRIMARY CARE FAX:  [phone]  REFERRING:  Donnice FABIENE Siad, MD  PROVIDER:  Donnice Siad, M.D.  LOCATION:  [facility] 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 He was previously followed by Dr. Matilda.   1. High risk nonmuscle invasive bladder cancer:  Presented with gross hematuria. Only smoked as young child. No family history of urologic malignancy. CT hematuria protocol 12/2018 with 3.5 cm mass in the posterior right bladder.  -S/p TURBT 01/2019 with gemcitabine  for 3.5cm right trigonal tumor. Path: HG T1 UCB (muscle present and uninvolved.  -Completed 6/6 BCG induction in 04/2019 and completed 3 maintenance BCG cycles.  -S/p TURBT 10/2020 of a small papillary lesion at the bladder neck/prostatic junction. Pathology: HG Ta UCB. Base of bladder resection revealed no involvement.  -Did not undergo BCG following TURBT in 10/2020  -Cystoscopy 09/25/2022 with tumor protruding from the right ureteral orifice with adjacent mass affect from the distal right ureter. No papillary lesions within the bladder itself.  -S/p TURBT 10/24/2022 with HG Ta UCB, subcentimeter adjacent to right ureter.  -Cysto 02/07/2023: NED  -Cysto 06/25/2023: NED  -Cysto 11/01/2023: Approximately 4 separate papillary and nodular lesions seen on the posterior wall and right lateral wall each about 2 cm.   2. Right upper tract urothelial carcinoma pT2N0M0:  -CT A/P 09/07/2022 with enhancing soft tissue in the distal right ureter extending to  the UVJ suspicious for urothelial neoplasm. Also associated moderate to right severe hydroureteronephrosis with right renal cortical atrophy.  -Cystoscopy 09/25/2022 with tumor protruding from the right ureteral orifice with adjacent mass affect from the distal right ureter.  -S/p right ureteral tumor biopsy with noninvasive high-grade urothelial carcinoma 10/24/2022.  -S/p robotic assisted right laparoscopic nephroureterectomy 11/12/2022. 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  -Current treatment: adjuvant therapy for [facility] after resection with gem/carbo plan for 4 cycles   Surveillance imaging:  -CT C/A/P w contrast 12/20/2022: Small amount of nodular thickening along the right posterior aspect of bladder wall, posterior second scarring/postsurgical change. No evidence of metastatic disease.  -CT C/A/P 06/2023: NED; small thickening of right lateral wall, no focal lesion. No metastatic disease   Surveillance cysto:  -Cysto 02/07/2023: Well-healing closure around prior placement of right ureteral orifice. No evidence of any recurrent papillary lesions.  -Cysto 06/25/2023: Well-healing closure around prior placement of right ureteral orifice. No evidence of any recurrent papillary lesions.   #2. Left renal cyst: CT mature protocol 12/2018 with 1.3 cm subcapsular lesion in the left interpolar kidney most likely complicated cyst. CT A/P 08/2022 with lateral left renal cyst that is Bosniak category 1. No further follow-up is required.   #3. Prostate cancer screening: He is adopted and unknown if he has history of prostate cancer. PSA in 08/2022 was normal at 2.97. He denies bothersome lower urinary tract symptoms. He does have 1 time nocturia.     ALLERGIES: None   MEDICATIONS: Lisinopril   Tamsulosin HCl 0.4 MG Capsule 2 capsule PO Q HS  busPIRone  HCl 10 MG Tablet  Colace  Sildenafil Citrate 100 MG Tablet  1 tablet PO Daily PRN  Trazodone  Hcl     GU PSH: Bladder  Instill AntiCA Agent - 2022, 2022, 2022, 2021, 2021, 2021, 2021, 2021, 2021, 2021, 2021, 2021, 2021, 2021, 2021, 2020 Cysto Fulgurate < 0.5 cm - 2021 Cystoscopy - 06/25/2023, 02/07/2023, 09/25/2022, 2023, 2022, 2022, 2022, 2021, 2021, 2021, 2020 Cystoscopy Insert Stent, Right - 2020 Cystoscopy TURBT <2 cm - 2022 Cystoscopy TURBT 2-5 cm - 2020 Inject For cystogram - 11/20/2022 Locm 300-399Mg /Ml Iodine,1Ml - 09/07/2022, 2020     NON-GU PSH: Visit Complexity (formerly GPC1X) - 06/25/2023, 11/20/2022, 10/31/2022, 08/29/2022     GU PMH: Bladder Cancer overlapping sites - 06/25/2023, - 06/14/2023, - 02/07/2023, - 12/06/2022, - 11/20/2022, - 10/31/2022, - 09/25/2022, - 09/07/2022, - 08/29/2022, History of high-grade nonmuscle invasive bladder cancer. Cystoscopy today negative. He has had initial hematuria once recently. Urinalysis today was clear, - 2023, No evidence of recurrence today, - 2022, Small recurrence resected earlier this month, he is doing well, - 2022, He has a persistent, slightly enlarging polypoid lesion in his bladder neck area that I think should be biopsied., - 2022, - 2022, - 2022, No evidence recurrence based on cystoscopy today., - 2022, cystoscopy not worrisome today., - 2021, - 2021, - 2021, He does have 2 small papillary recurrences today there were treated with cautery., - 2021, - 2021, History of high-grade non muscle invasive bladder cancer, status post resection in November, 2020. No evidence recurrence cystoscopically today., - 2021 (Stable), He tolerated initial TURBT very well. Recommended that he is initiated on a 6 wk course of BCG. We also discussed the potential for needing a repeat TURBT to screen for remaining disease and clean up if necessary. , - 2020 BPH w/o LUTS - 06/25/2023, - 02/07/2023, - 12/06/2022, - 08/29/2022 Malig Neo Ureteric Orifice - 06/25/2023, - 02/07/2023, - 11/20/2022, - 10/31/2022, - 09/25/2022 Renal cyst - 06/14/2023, - 02/07/2023, - 08/29/2022 Acute Cystitis/UTI - 12/06/2022, -  11/23/2022 Encounter for Prostate Cancer screening - 09/25/2022, - 08/29/2022 ED due to arterial insufficiency - 2022, He expresses interest in starting on sildenafil. , - 2020 Bladder tumor/neoplasm, Right, Bladder tumor present on right trigonal area -- likely low-grade and non-invasive but we will move forward with TURBT. - 2020 BPH w/LUTS, Prostate slightly enlarged though otherwise normal on exam. - 2020 Gross hematuria, Will evaluate w/ CT Bladder tumor present on cysto. - 2020 Nocturia - 2020    NON-GU PMH: No Non-GU PMH    FAMILY HISTORY: 2 sons - Son    Notes: adopted   SOCIAL HISTORY: Marital Status: Unknown Preferred Language: English; Ethnicity: Not Hispanic Or Latino; Race: White Current Smoking Status: Patient does not smoke anymore. Has not smoked since 12/25/1970. Smoked for 2 years. Smoked 1/2 pack per day.   Tobacco Use Assessment Completed: Used Tobacco in last 30 days? Does not use smokeless tobacco. Drinks 6 drinks per day. Types of alcohol consumed: Beer. Moderate Drinker.  Does not use drugs. Drinks 3 caffeinated drinks per day. Has not had a blood transfusion. 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    REVIEW OF SYSTEMS:    GU Review Male:   Patient denies frequent urination, hard to postpone urination, burning/ pain with urination, get up at night to urinate, leakage of urine, stream starts and stops, trouble starting your stream, have to strain to urinate , erection problems, and penile pain.  Gastrointestinal (Upper):   Patient denies nausea, vomiting, and indigestion/ heartburn.  Gastrointestinal (Lower):   Patient denies diarrhea and constipation.  Constitutional:   Patient denies fever, night sweats, weight loss, and fatigue.  Skin:   Patient denies itching and skin rash/ lesion.  Eyes:   Patient denies blurred vision and double vision.  Ears/ Nose/ Throat:   Patient denies sore throat and sinus problems.  Hematologic/Lymphatic:   Patient denies swollen  glands and easy bruising.  Cardiovascular:   Patient denies leg swelling and chest pains.  Respiratory:   Patient denies cough and shortness of breath.  Endocrine:   Patient denies excessive thirst.  Musculoskeletal:   Patient denies back pain and joint pain.  Neurological:   Patient denies headaches and dizziness.  Psychologic:   Patient denies depression and anxiety.   VITAL SIGNS: None   MULTI-SYSTEM PHYSICAL EXAMINATION:    Constitutional: Well-nourished. No physical deformities. Normally developed. Good grooming.  Respiratory: No labored breathing, no use of accessory muscles.   Cardiovascular: Normal temperature, normal extremity pulses, no swelling, no varicosities.  Gastrointestinal: No mass, no tenderness, no rigidity, non obese abdomen.     Complexity of Data:  Source Of History:  Patient, Medical Record Summary  Records Review:   Previous Doctor Records, Previous Patient Records  Urine Test Review:   Urinalysis   08/29/22  PSA  Total PSA 2.97 ng/mL    PROCEDURES:         Flexible Cystoscopy - 52000  Risks, benefits, and some of the potential complications of the procedure were discussed at length with the patient including infection, bleeding, voiding discomfort, urinary retention, fever, chills, sepsis, and others. All questions were answered. Informed consent was obtained. Antibiotic prophylaxis was given. Sterile technique and intraurethral analgesia were used.  Meatus:  Normal size. Normal location. Normal condition.  Urethra:  No strictures.  External Sphincter:  Normal.  Verumontanum:  Normal.  Prostate:  Non-obstructing. No hyperplasia.  Bladder Neck:  Non-obstructing.  Ureteral Orifices:  Right is surgically absent. Left is patent with no tumors nearby.  Bladder:  Approximately 4 separate 2 cm papular nodular lesions seen on the posterior wall and right lateral wall      The lower urinary tract was carefully examined. The procedure was well-tolerated and  without complications. Antibiotic instructions were given. Instructions were given to call the office immediately for bloody urine, difficulty urinating, urinary retention, painful or frequent urination, fever, chills, nausea, vomiting or other illness. The patient stated that he understood these instructions and would comply with them.         Visit Complexity - G2211          Urinalysis w/Scope Dipstick Dipstick Cont'd Micro  Color: Yellow Bilirubin: Neg mg/dL WBC/hpf: NS (Not Seen)  Appearance: Clear Ketones: Neg mg/dL RBC/hpf: 3 - 89/yeq  Specific Gravity: <=1.005 Blood: 1+ ery/uL Bacteria: NS (Not Seen)  pH: <=5.0 Protein: Neg mg/dL Cystals: NS (Not Seen)  Glucose: Neg mg/dL Urobilinogen: 0.2 mg/dL Casts: NS (Not Seen)    Nitrites: Neg Trichomonas: Not Present    Leukocyte Esterase: Neg leu/uL Mucous: Not Present      Epithelial Cells: 0 - 5/hpf      Yeast: NS (Not Seen)      Sperm: Not Present    ASSESSMENT:      ICD-10 Details  1 GU:   Bladder Cancer overlapping sites - C67.8   2   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 Hematuria With and Without I.V. Contrast. No Oral Contrast          Schedule Return Visit/Planned Activity: Next Available Appointment - Schedule Surgery          Document Letter(s):  Created for Patient: Clinical Summary         Notes:    1. High risk nonmuscle invasive bladder cancer:  -TURBT with HG Ta UCB adjacent to right ureter. BCG was witheld given concomitant R UTUC and this was resected with R NUx.  -Cystoscopy 11/01/2023: Approximately 4 separate papillary and nodular lesions seen on posterior wall and right lateral wall  - Will send urine for cytology  - Will update CT hematuria protocol. Of note, this was -4 months ago.  - Surgery letter submitted for TURBT, consideration of gemcitabine    Risks and benefits of Transurethral Resection of  Bladder Tumor were reviewed in detail including infection, bleeding, blood transfusion, injury to bladder/urethra/surrounding structures, bladder perforation, obstructive and irritative voiding symptoms, and global anesthesia risks including but not limited to CVA, MI, DVT, PE, pneumonia, and death. Patient expressed understanding and desire to proceed.   #2. Right upper tract urothelial carcinoma:  -S/p robotic assisted right laparoscopic nephroureterectomy 11/12/2022. 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  -S/p adjuvant chemo by Dr. Tina  -Cystoscopy today with recurrent bladder tumor.  -Will obtain CT hematuria protocol.   #3. Simple left renal cyst: We discussed this Bosniak category 3, no further follow-up is required.   #4. Prostate cancer screening: He is up-to-date with prostate cancer screening. Recommend PSA annually.    Urology Preoperative H&P   Chief Complaint: Bladder cancer  History of Present Illness: SHAMMOND ARAVE is a 68 y.o. male with high risk NMIBC and history of right upper tract urothelial carcinoma with recurrent bladder masses here for TURBT with gemcitabine  instillation. CT hematuria with 4 tumors each about 2cm, no masses outside bladder. Denies fevers, chills, dysuria.    Past Medical History:  Diagnosis Date   Adopted    per pt unknown family medical history   Allergic rhinitis    Anxiety    Arthritis    Bladder cancer [facility])    urologist-- dr matilda---  TURBT 11/ 2020   Chronic sinusitis    GERD (gastroesophageal reflux disease)    pt denies   History of 2019 novel coronavirus disease (COVID-19) 07/09/2019   positive result in epic, per pt no symptoms   History of bronchoscopy 11/24/2019   per pt this was done due to abnormal chest CT, sob, and hx bronchospasm 2020;  normal bronchoscopy with no lesion and no etiology of bronchospasms   History of Helicobacter pylori infection 11/2019   treated    Hypertension    Seasonal allergies     Past Surgical History:  Procedure Laterality Date   COLONOSCOPY WITH ESOPHAGOGASTRODUODENOSCOPY (EGD)  12/16/2019   CYSTOSCOPY WITH BIOPSY N/A 10/31/2020   Procedure: CYSTOSCOPY WITH TRANSURETRAL RESECTION OF  OF BLADDER NECK LESION;  Surgeon: matilda Senior, MD;  Location: [facility];  Service: Urology;  Laterality: N/A;   CYSTOSCOPY/URETEROSCOPY/HOLMIUM LASER/STENT PLACEMENT Right 10/24/2022   Procedure: CYSTOSCOPY/RIGHT URETEROSCOPY WITH  BIOPSY/RIGHT RETROGRADE PYELOGRAM/RIGHT STENT PLACEMENT;  Surgeon: Selma Donnice JONELLE, MD;  Location: [facility];  Service: Urology;  Laterality: Right;   ROBOT ASSITED LAPAROSCOPIC NEPHROURETERECTOMY Right 11/12/2022   Procedure: XI RIGHT ROBOT ASSISTED LAPAROSCOPIC RADICAL NEPHROURETERECTOMY;  Surgeon: Selma Donnice JONELLE, MD;  Location: [facility];  Service:  Urology;  Laterality: Right;   SEPTOPLASTY WITH ETHMOIDECTOMY, AND MAXILLARY ANTROSTOMY  09/13/2020   TRANSURETHRAL RESECTION OF BLADDER TUMOR N/A 10/24/2022   Procedure: TRANSURETHRAL RESECTION OF BLADDER TUMOR (TURBT);  Surgeon: Selma Donnice SAUNDERS, MD;  Location: [facility];  Service: Urology;  Laterality: N/A;   TRANSURETHRAL RESECTION OF BLADDER TUMOR WITH MITOMYCIN -C N/A 01/26/2019   Procedure: TRANSURETHRAL RESECTION OF BLADDER TUMOR WITH MITOMYCIN -C;  Surgeon: Matilda Senior, MD;  [facility]  Service: Urology;  Laterality: N/A;   VIDEO BRONCHOSCOPY N/A 11/24/2019   Procedure: VIDEO BRONCHOSCOPY WITHOUT FLUORO;  Surgeon: Neda Jennet LABOR, MD;  Location: [facility];  Service: Pulmonary;  Laterality: N/A;    Allergies: No Known Allergies  Family History  Adopted: Yes  Problem Relation Age of Onset   Colon cancer Neg Hx    Esophageal cancer Neg Hx    Rectal cancer Neg Hx    Stomach cancer Neg Hx    Colon polyps Neg Hx     Social History:  reports that he quit smoking about 51 years ago. His smoking use included cigarettes.  He started smoking about 53 years ago. He has never been exposed to tobacco smoke. He has never used smokeless tobacco. He reports current alcohol use of about 42.0 standard drinks of alcohol per week. He reports that he does not use drugs.  ROS: A complete review of systems was performed.  All systems are negative except for pertinent findings as noted.  Physical Exam:  Vital signs in last 24 hours: Temp:  [98.5 F (36.9 C)] 98.5 F (36.9 C) (09/08 1351) Pulse Rate:  [64] 64 (09/08 1351) Resp:  [16] 16 (09/08 1351) BP: (164)/(76) 164/76 (09/08 1351) SpO2:  [96 %] 96 % (09/08 1351) Weight:  [84.8 kg] 84.8 kg (09/08 1351) Constitutional:  Alert and oriented, No acute distress Cardiovascular: Regular rate and rhythm Respiratory: Normal respiratory effort, Lungs clear bilaterally GI: Abdomen is soft, nontender, nondistended, no abdominal masses GU: No CVA tenderness Lymphatic: No lymphadenopathy Neurologic: Grossly intact, no focal deficits Psychiatric: Normal mood and affect  Laboratory Data:  No results for input(s): WBC, HGB, HCT, PLT in the last 72 hours.  No results for input(s): NA, K, CL, GLUCOSE, BUN, CALCIUM, CREATININE in the last 72 hours.  Invalid input(s): CO3   No results found for this or any previous visit (from the past 24 hours). No results found for this or any previous visit (from the past 240 hours).  Renal Function: No results for input(s): CREATININE in the last 168 hours. Estimated Creatinine Clearance: 77.8 mL/min (by C-G formula based on SCr of 0.97 mg/dL).  Radiologic Imaging: No results found.  I independently reviewed the above imaging studies.  Assessment and Plan THADDEAUS MONICA is a 69 y.o. male with  high risk NMIBC and history of right upper tract urothelial carcinoma with recurrent bladder masses here for TURBT with gemcitabine  instillation.  Risks and benefits of Transurethral Resection of Bladder Tumor were  reviewed in detail including infection, bleeding, blood transfusion, injury to bladder/urethra/surrounding structures, bladder perforation, obstructive and irritative voiding symptoms, and global anesthesia risks including but not limited to CVA, MI, DVT, PE, pneumonia, and death. Patient expressed understanding and desire to proceed.   Matt R. Rashon Westrup MD 12/02/2023, 2:09 PM  [facility] Pager: [phone]

## 2023-12-02 NOTE — Discharge Instructions (Signed)
Activity:  You are encouraged to ambulate frequently (about every hour during waking hours) to help prevent blood clots from forming in your legs or lungs.   ? ?Diet: You should advance your diet as instructed by your physician.  It will be normal to have some bloating, nausea, and abdominal discomfort intermittently. ? ?Prescriptions:  You will be provided a prescription for pain medication to take as needed.  If your pain is not severe enough to require the prescription pain medication, you may take extra strength Tylenol instead which will have less side effects.  You should also take a prescribed stool softener to avoid straining with bowel movements as the prescription pain medication may constipate you. ? ?What to call us about: You should call the office ([phone]) if you develop fever > 101 or develop persistent vomiting. Activity:  You are encouraged to ambulate frequently (about every hour during waking hours) to help prevent blood clots from forming in your legs or lungs.  ?

## 2023-12-02 NOTE — Op Note (Signed)
 Operative Note  Preoperative diagnosis:  1.  Bladder cancer  Postoperative diagnosis: 1.  Bladder cancer  Procedure(s): 1.  TURBT large 2. Instillation of gemcitabine   Surgeon: Donnice Siad, MD  Assistants:  None  Anesthesia:  General  Complications:  None  EBL:  Minimal  Specimens: 1.  ID Type Source Tests Collected by Time Destination  1 : Right Trigone Wall Bladder Tumor Tissue PATH GU tumor resection SURGICAL PATHOLOGY Siad Donnice SAUNDERS, MD 12/02/2023 1546   2 : Posterior Wall Bladder Tumor Tissue PATH GU tumor resection SURGICAL PATHOLOGY Siad Donnice SAUNDERS, MD 12/02/2023 1559   3 : Posterior Superior Wall Bladder Tumor Tissue PATH GU tumor resection SURGICAL PATHOLOGY Siad Donnice SAUNDERS, MD 12/02/2023 1613    Drains/Catheters: 1.  18 French foley catheter  Intraoperative findings:   4 separate larger lesions about 2 cm.  1 lesion involve the right trigone, 2 separate lesions were involving the posterior wall and were immediately adjacent.  There was a separate posterior superior bladder lesion.  All these were about 2 cm in papillary in nature.  There was a small satellite papillary lesion adjacent to the posterior bladder wall lesions that was punctate.  Every lesion was resected with excellent resection excellent hemostasis.  Number of tumors:          5 Size of largest tumor:          2 cm    Characteristics of tumors:     Papillary  Recurrent   Yes  Suspicious for Carcinoma in situ:    No  Clinical tumor stage:            cTa     Bimanual exam under anesthesia:        Yes - no 3D mass  Visually complete resection:                 Yes  Visualization of detrusor muscle in resection base:        Yes  Visual evaluation for perforation:             Performed - no evidence of perforation   Indication:  Billy Hernandez is a 68 y.o. 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  All the risks, benefits  were discussed with the patient to include but not limited to infection, pain, bleeding, damage to adjacent structures, need for further operations, adverse reaction to anesthesia and death.  Patient understands these risks and agrees to proceed with the operation as planned.    Description of procedure: After informed consent was obtained from the patient, the patient was taken to the operating room. General anesthesia was administered. The patient was placed in dorsal lithotomy position and prepped and draped in usual sterile fashion. Sequential compression devices were applied to lower extremities at the beginning of the case for DVT prophylaxis. Antibiotics were infused prior to surgery start time. A surgical time-out was performed to properly identify the patient, the surgery to be performed, and the surgical site.     We then passed the 21-French rigid cystoscope down the urethra and into the bladder under direct vision without any difficulty. The anterior urethral was normal. The prostate was non-obstructing. The bladder was inspected with 30 and 70 degree lenses. Once in the bladder, systematic evaluation of bladder revealed 4 separate larger lesions each about 2 cm.  There was a lesion involving the right trigone.  There were  2 separate each about 2 cm lesions involving the posterior wall.  There is a separate 2 cm posterior superior papillary lesion.  There is a small punctate satellite lesion adjacent to the posterior lesions.  The left ureteral orifice was uninvolved.   We then removed the cystoscope and then passed down the 26 French resectoscope sheath down the urethra into the bladder under direct vision with the visual obturator. The tumor was resected down to muscle. The TUR bladder tumor chips were retrieved from the bladder and each region of resection was passed off the field as a separate specimen.  Hemostasis was achieved using electrocautery. We then proceeded with removing the  resectoscope and then placed in a 18 French Foley catheter.  The patient tolerated the procedure well with no complication and was awoken from anesthesia and taken to recovery in stable condition.     While in the post-operative care unit, as a separate procedure, 2000 mg of gemcitabine  in 50 mL of water  was instilled in the bladder through the catheter and the catheter was plugged. This will remain indwelling for approximately one hour. It will then be drained from the bladder and the catheter will be removed and the patient discharged home.  Plan:  F/u in one week to review pathology.  Billy R. Rhema Boyett MD [facility]  Pager: [phone]

## 2023-12-02 NOTE — Anesthesia Preprocedure Evaluation (Addendum)
 Anesthesia Evaluation  Patient identified by MRN, date of birth, ID band Patient awake    Reviewed: Allergy & Precautions, NPO status , Patient's Chart, lab work & pertinent test results  History of Anesthesia Complications Negative for: history of anesthetic complications  Airway Mallampati: II  TM Distance: >3 FB Neck ROM: Full    Dental  (+) Dental Advisory Given,    Pulmonary former smoker   breath sounds clear to auscultation       Cardiovascular hypertension, Pt. on medications  Rhythm:Regular Rate:Normal     Neuro/Psych   Anxiety     negative neurological ROS     GI/Hepatic Neg liver ROS,GERD  ,,  Endo/Other  negative endocrine ROS    Renal/GU negative Renal ROS     Musculoskeletal  (+) Arthritis ,    Abdominal   Peds  Hematology  (+) Blood dyscrasia, anemia   Anesthesia Other Findings Bladder cancer  Reproductive/Obstetrics                              Anesthesia Physical Anesthesia Plan  ASA: 2  Anesthesia Plan: General   Post-op Pain Management: Tylenol  PO (pre-op)*   Induction: Intravenous  PONV Risk Score and Plan: 3 and Ondansetron , Dexamethasone  and Midazolam   Airway Management Planned: Oral ETT and LMA  Additional Equipment: None  Intra-op Plan:   Post-operative Plan: Extubation in OR  Informed Consent: I have reviewed the patients History and Physical, chart, labs and discussed the procedure including the risks, benefits and alternatives for the proposed anesthesia with the patient or authorized representative who has indicated his/her understanding and acceptance.     Dental advisory given  Plan Discussed with: CRNA  Anesthesia Plan Comments:         Anesthesia Quick Evaluation

## 2023-12-02 NOTE — Transfer of Care (Signed)
 Immediate Anesthesia Transfer of Care Note  Patient: Billy Hernandez  Procedure(s) Performed: TURBT, WITH CHEMOTHERAPEUTIC AGENT INSTILLATION INTO BLADDER  Patient Location: PACU  Anesthesia Type:General  Level of Consciousness: sedated, patient cooperative, and responds to stimulation  Airway & Oxygen Therapy: Patient Spontanous Breathing and Patient connected to face mask oxygen  Post-op Assessment: Report given to RN and Post -op Vital signs reviewed and stable  Post vital signs: Reviewed and stable  Last Vitals:  Vitals Value Taken Time  BP 183/92 12/02/23 16:30  Temp    Pulse 81 12/02/23 16:31  Resp 20 12/02/23 16:31  SpO2 100 % 12/02/23 16:31  Vitals shown include unfiled device data.  Last Pain:  Vitals:   12/02/23 1351  TempSrc: Oral  PainSc: 0-No pain      Patients Stated Pain Goal: 5 (12/02/23 1351)  Complications: No notable events documented.

## 2023-12-02 NOTE — Anesthesia Procedure Notes (Signed)
 Procedure Name: Intubation Date/Time: 12/02/2023 3:31 PM  Performed by: Therisa Doyal CROME, CRNAPre-anesthesia Checklist: Patient identified, Emergency Drugs available, Suction available and Patient being monitored Patient Re-evaluated:Patient Re-evaluated prior to induction Oxygen Delivery Method: Circle system utilized Preoxygenation: Pre-oxygenation with 100% oxygen Induction Type: IV induction Ventilation: Mask ventilation without difficulty Laryngoscope Size: Miller and 3 Grade View: Grade I Tube type: Oral Tube size: 8.0 mm Number of attempts: 1 Airway Equipment and Method: Stylet and Oral airway Placement Confirmation: ETT inserted through vocal cords under direct vision, positive ETCO2 and breath sounds checked- equal and bilateral Secured at: 22 cm Tube secured with: Tape Dental Injury: Teeth and Oropharynx as per pre-operative assessment

## 2023-12-03 NOTE — Anesthesia Postprocedure Evaluation (Signed)
 Anesthesia Post Note  Patient: Billy Hernandez  Procedure(s) Performed: TURBT, WITH CHEMOTHERAPEUTIC AGENT INSTILLATION INTO BLADDER     Patient location during evaluation: PACU Anesthesia Type: General Level of consciousness: awake and alert Pain management: pain level controlled Vital Signs Assessment: post-procedure vital signs reviewed and stable Respiratory status: spontaneous breathing, nonlabored ventilation, respiratory function stable and patient connected to nasal cannula oxygen Cardiovascular status: blood pressure returned to baseline and stable Postop Assessment: no apparent nausea or vomiting Anesthetic complications: no   No notable events documented.  Last Vitals:  Vitals:   12/02/23 1815 12/02/23 1823  BP: (!) 177/82   Pulse: 73   Resp:    Temp:    SpO2: 98% 97%    Last Pain:  Vitals:   12/02/23 1823  TempSrc:   PainSc: 0-No pain                 Thom SAUNDERS Peoples

## 2023-12-04 LAB — SURGICAL PATHOLOGY

## 2023-12-17 ENCOUNTER — Other Ambulatory Visit: Payer: Self-pay | Admitting: Urology

## 2023-12-27 NOTE — Progress Notes (Addendum)
 COVID Vaccine received:  []  No [x]  Yes Date of any COVID positive Test in last 90 days: no PCP - [place] [facility] Cardiologist - n/a Oncology- Pauletta Chihuahua MD  Chest x-ray -  EKG -  11/11/23 Epic Stress Test -  ECHO -  Cardiac Cath -   Bowel Prep - [x]  No  []   Yes ______  Pacemaker / ICD device [x]  No []  Yes   Spinal Cord Stimulator:[x]  No []  Yes       History of Sleep Apnea? [x]  No []  Yes   CPAP used?- [x]  No []  Yes    Does the patient monitor blood sugar?          [x]  No []  Yes  []  N/A  Patient has: [x]  NO Hx DM   []  Pre-DM                 []  DM1  []   DM2 Does patient have a [employer] or Dexacom? []  No []  Yes   Fasting Blood Sugar Ranges-  Checks Blood Sugar _____ times a day  GLP1 agonist / usual dose - no GLP1 instructions:  SGLT-2 inhibitors / usual dose - no SGLT-2 instructions:   Blood Thinner / Instructions:no Aspirin Instructions:no  Comments:   Activity level: Patient is able  to climb a flight of stairs without difficulty; []  No CP  []  No SOB,  Patient can perform ADLs without assistance.   Anesthesia review:   Patient denies shortness of breath, fever, cough and chest pain at PAT appointment.  Patient verbalized understanding and agreement to the Pre-Surgical Instructions that were given to them at this PAT appointment. Patient was also educated of the need to review these PAT instructions again prior to his/her surgery.I reviewed the appropriate phone numbers to call if they have any and questions or concerns.

## 2023-12-27 NOTE — Patient Instructions (Addendum)
 SURGICAL WAITING ROOM VISITATION  Patients having surgery or a procedure may have no more than 2 support people in the waiting area - these visitors may rotate.    Children under the age of 25 must have an adult with them who is not the patient.  Visitors with respiratory illnesses are discouraged from visiting and should remain at home.  If the patient needs to stay at the hospital during part of their recovery, the visitor guidelines for inpatient rooms apply. Pre-op nurse will coordinate an appropriate time for 1 support person to accompany patient in pre-op.  This support person may not rotate.    Please refer to the [facility] website for the visitor guidelines for Inpatients (after your surgery is over and you are in a regular room).       Your procedure is scheduled on: 01/03/24   Report to [facility] Main Entrance    Report to admitting at 6:45 AM   Call this number if you have problems the morning of surgery [phone]   Do not eat food or drink liquids:After Midnight.but may have sips of water  with meds.     Oral Hygiene is also important to reduce your risk of infection.                                    Remember - BRUSH YOUR TEETH THE MORNING OF SURGERY WITH YOUR REGULAR TOOTHPASTE  DENTURES WILL BE REMOVED PRIOR TO SURGERY PLEASE DO NOT APPLY Poly grip OR ADHESIVES!!!   Stop all vitamins and herbal supplements 7 days before surgery.   Take these medicines the morning of surgery with A SIP OF WATER : tamsulosin              Do not take Lisinopril  the morning of surgery.             You may not have any metal on your body including hair pins, jewelry, and body piercing             Do not wear make-up, lotions, powders, perfumes/cologne, or deodorant               Men may shave face and neck.   Do not bring valuables to the hospital. [facility] IS NOT             RESPONSIBLE   FOR VALUABLES.   Contacts, glasses, dentures or bridgework may not be  worn into surgery.  DO NOT BRING YOUR HOME MEDICATIONS TO THE HOSPITAL. PHARMACY WILL DISPENSE MEDICATIONS LISTED ON YOUR MEDICATION LIST TO YOU DURING YOUR ADMISSION IN THE HOSPITAL!    Patients discharged on the day of surgery will not be allowed to drive home.  Someone NEEDS to stay with you for the first 24 hours after anesthesia.   Special Instructions: Bring a copy of your healthcare power of attorney and living will documents the day of surgery if you haven't scanned them before.              Please read over the following fact sheets you were given: IF YOU HAVE QUESTIONS ABOUT YOUR PRE-OP INSTRUCTIONS PLEASE CALL [phone] [place]   If you received a COVID test during your pre-op visit  it is requested that you wear a mask when out in public, stay away from anyone that may not be feeling well and notify your surgeon if you develop symptoms. If you  test positive for Covid or have been in contact with anyone that has tested positive in the last 10 days please notify you surgeon.    [place] - Preparing for Surgery Before surgery, you can play an important role.  Because skin is not sterile, your skin needs to be as free of germs as possible.  You can reduce the number of germs on your skin by washing with CHG (chlorahexidine gluconate) soap before surgery.  CHG is an antiseptic cleaner which kills germs and bonds with the skin to continue killing germs even after washing. Please DO NOT use if you have an allergy to CHG or antibacterial soaps.  If your skin becomes reddened/irritated stop using the CHG and inform your nurse when you arrive at Short Stay. Do not shave (including legs and underarms) for at least 48 hours prior to the first CHG shower.  You may shave your face/neck.  Please follow these instructions carefully:  1.  Shower with CHG Soap the night before surgery and the morning of surgery.  2.  If you choose to wash your hair, wash your hair first as usual with your normal   shampoo.  3.  After you shampoo, rinse your hair and body thoroughly to remove the shampoo.                             4.  Use CHG as you would any other liquid soap.  You can apply chg directly to the skin and wash.  Gently with a scrungie or clean washcloth.  5.  Apply the CHG Soap to your body ONLY FROM THE NECK DOWN.   Do   not use on face/ open                           Wound or open sores. Avoid contact with eyes, ears mouth and   genitals (private parts).                       Wash face,  Genitals (private parts) with your normal soap.             6.  Wash thoroughly, paying special attention to the area where your    surgery  will be performed.  7.  Thoroughly rinse your body with warm water  from the neck down.  8.  DO NOT shower/wash with your normal soap after using and rinsing off the CHG Soap.                9.  Pat yourself dry with a clean towel.            10.  Wear clean pajamas.            11.  Place clean sheets on your bed the night of your first shower and do not  sleep with pets. Day of Surgery : Do not apply any CHG, lotions/deodorants the morning of surgery.  Please wear clean clothes to the hospital/surgery center.  FAILURE TO FOLLOW THESE INSTRUCTIONS MAY RESULT IN THE CANCELLATION OF YOUR SURGERY  PATIENT SIGNATURE_________________________________  NURSE SIGNATURE__________________________________  ________________________________________________________________________

## 2023-12-30 ENCOUNTER — Encounter (HOSPITAL_COMMUNITY)
Admission: RE | Admit: 2023-12-30 | Discharge: 2023-12-30 | Disposition: A | Discharge status: Home / Self Care | Source: Ambulatory Visit | Attending: Urology | Admitting: Urology

## 2023-12-30 ENCOUNTER — Other Ambulatory Visit: Payer: Self-pay

## 2023-12-30 VITALS — Ht 72.0 in | Wt 180.0 lb

## 2023-12-30 DIAGNOSIS — I10 Essential (primary) hypertension: Secondary | ICD-10-CM | POA: Insufficient documentation

## 2023-12-30 DIAGNOSIS — Z01818 Encounter for other preprocedural examination: Secondary | ICD-10-CM

## 2023-12-30 DIAGNOSIS — Z01812 Encounter for preprocedural laboratory examination: Secondary | ICD-10-CM | POA: Diagnosis present

## 2023-12-30 LAB — BASIC METABOLIC PANEL WITH GFR
Anion gap: 13 (ref 5–15)
BUN: 10 mg/dL (ref 8–23)
CO2: 22 mmol/L (ref 22–32)
Calcium: 9.2 mg/dL (ref 8.9–10.3)
Chloride: 96 mmol/L — ABNORMAL LOW (ref 98–111)
Creatinine, Ser: 0.96 mg/dL (ref 0.61–1.24)
GFR, Estimated: >60 mL/min (ref >60)
Glucose, Bld: 86 mg/dL (ref 70–99)
Potassium: 3.9 mmol/L (ref 3.5–5.1)
Sodium: 131 mmol/L — ABNORMAL LOW (ref 135–145)

## 2023-12-30 LAB — CBC
HCT: 35.2 % — ABNORMAL LOW (ref 39.0–52.0)
Hemoglobin: 11.7 g/dL — ABNORMAL LOW (ref 13.0–17.0)
MCH: 32.1 pg (ref 26.0–34.0)
MCHC: 33.2 g/dL (ref 30.0–36.0)
MCV: 96.7 fL (ref 80.0–100.0)
Platelets: 260 K/uL (ref 150–400)
RBC: 3.64 MIL/uL — ABNORMAL LOW (ref 4.22–5.81)
RDW: 13.3 % (ref 11.5–15.5)
WBC: 5.2 K/uL (ref 4.0–10.5)
nRBC: 0 % (ref 0.0–0.2)

## 2023-12-30 NOTE — Progress Notes (Signed)
 Reached pt. By phone to do PST interview. Pt. Identified himself by name and DOB. Pt. Answered all questions. Pre op instructions given. He stated he did not have any questions. Instructions and soap will be given to pt. When he comes for his labs at 2:30PM today. Pre admit will also be done at that time.

## 2024-01-03 ENCOUNTER — Encounter (HOSPITAL_COMMUNITY): Payer: Self-pay | Admitting: Urology

## 2024-01-03 ENCOUNTER — Other Ambulatory Visit: Payer: Self-pay

## 2024-01-03 ENCOUNTER — Ambulatory Visit (HOSPITAL_COMMUNITY)
Admission: RE | Admit: 2024-01-03 | Discharge: 2024-01-03 | Disposition: A | Discharge status: Home / Self Care | Attending: Urology | Admitting: Urology

## 2024-01-03 ENCOUNTER — Ambulatory Visit (HOSPITAL_COMMUNITY): Payer: Self-pay | Admitting: Medical

## 2024-01-03 ENCOUNTER — Encounter (HOSPITAL_COMMUNITY)
Admission: RE | Disposition: A | Discharge status: Home / Self Care | Payer: Self-pay | Source: Home / Self Care | Attending: Urology

## 2024-01-03 ENCOUNTER — Ambulatory Visit (HOSPITAL_COMMUNITY): Admitting: Anesthesiology

## 2024-01-03 DIAGNOSIS — Z87891 Personal history of nicotine dependence: Secondary | ICD-10-CM | POA: Diagnosis not present

## 2024-01-03 DIAGNOSIS — C679 Malignant neoplasm of bladder, unspecified: Secondary | ICD-10-CM

## 2024-01-03 DIAGNOSIS — N281 Cyst of kidney, acquired: Secondary | ICD-10-CM | POA: Diagnosis not present

## 2024-01-03 DIAGNOSIS — N5201 Erectile dysfunction due to arterial insufficiency: Secondary | ICD-10-CM | POA: Diagnosis not present

## 2024-01-03 DIAGNOSIS — Z9221 Personal history of antineoplastic chemotherapy: Secondary | ICD-10-CM | POA: Diagnosis not present

## 2024-01-03 DIAGNOSIS — I10 Essential (primary) hypertension: Secondary | ICD-10-CM | POA: Insufficient documentation

## 2024-01-03 DIAGNOSIS — Z79899 Other long term (current) drug therapy: Secondary | ICD-10-CM | POA: Insufficient documentation

## 2024-01-03 DIAGNOSIS — C678 Malignant neoplasm of overlapping sites of bladder: Secondary | ICD-10-CM | POA: Diagnosis present

## 2024-01-03 SURGERY — TURBT, WITH CHEMOTHERAPEUTIC AGENT INSTILLATION INTO BLADDER
Anesthesia: General | Site: Bladder

## 2024-01-03 MED ORDER — SUGAMMADEX SODIUM 200 MG/2ML IV SOLN
INTRAVENOUS | Status: DC | PRN
  Administered 2024-01-03: 200 mg via INTRAVENOUS

## 2024-01-03 MED ORDER — OXYCODONE HCL 5 MG/5ML PO SOLN: 5.0000 mg | Freq: Once | ORAL | Status: AC | PRN

## 2024-01-03 MED ORDER — DROPERIDOL 2.5 MG/ML IJ SOLN: 0.6250 mg | Freq: Once | INTRAMUSCULAR | Status: DC | PRN

## 2024-01-03 MED ORDER — HYDRALAZINE HCL 20 MG/ML IJ SOLN
10.0000 mg | Freq: Once | INTRAMUSCULAR | Status: AC
  Administered 2024-01-03: 10 mg via INTRAVENOUS

## 2024-01-03 MED ORDER — FENTANYL CITRATE (PF) 100 MCG/2ML IJ SOLN
INTRAMUSCULAR | Status: AC
  Filled 2024-01-03: qty 2

## 2024-01-03 MED ORDER — ROCURONIUM BROMIDE 100 MG/10ML IV SOLN
INTRAVENOUS | Status: DC | PRN
  Administered 2024-01-03: 50 mg via INTRAVENOUS

## 2024-01-03 MED ORDER — ACETAMINOPHEN 10 MG/ML IV SOLN
1000.0000 mg | Freq: Once | INTRAVENOUS | Status: DC | PRN
  Administered 2024-01-03: 1000 mg via INTRAVENOUS

## 2024-01-03 MED ORDER — FENTANYL CITRATE (PF) 50 MCG/ML IJ SOSY
PREFILLED_SYRINGE | INTRAMUSCULAR | Status: AC
  Filled 2024-01-03: qty 3

## 2024-01-03 MED ORDER — ONDANSETRON HCL 4 MG/2ML IJ SOLN
INTRAMUSCULAR | Status: DC | PRN
  Administered 2024-01-03: 4 mg via INTRAVENOUS

## 2024-01-03 MED ORDER — LIDOCAINE HCL (PF) 2 % IJ SOLN
INTRAMUSCULAR | Status: DC | PRN
Start: 2024-01-03 — End: 2024-01-03
  Administered 2024-01-03: 100 mg via INTRADERMAL

## 2024-01-03 MED ORDER — LACTATED RINGERS IV SOLN
INTRAVENOUS | Status: DC
Start: 2024-01-03 — End: 2024-01-03

## 2024-01-03 MED ORDER — OXYCODONE HCL 5 MG PO TABS
ORAL_TABLET | ORAL | Status: AC
  Filled 2024-01-03: qty 1

## 2024-01-03 MED ORDER — ROCURONIUM BROMIDE 10 MG/ML (PF) SYRINGE
PREFILLED_SYRINGE | INTRAVENOUS | Status: AC
  Filled 2024-01-03: qty 10

## 2024-01-03 MED ORDER — LIDOCAINE HCL (PF) 2 % IJ SOLN
INTRAMUSCULAR | Status: AC
  Filled 2024-01-03: qty 5

## 2024-01-03 MED ORDER — SODIUM CHLORIDE 0.9 % IR SOLN
Status: DC | PRN
  Administered 2024-01-03: 3000 mL via INTRAVESICAL
  Administered 2024-01-03: 6000 mL via INTRAVESICAL

## 2024-01-03 MED ORDER — OXYCODONE HCL 5 MG PO TABS
5.0000 mg | ORAL_TABLET | Freq: Once | ORAL | Status: AC | PRN
  Administered 2024-01-03: 5 mg via ORAL

## 2024-01-03 MED ORDER — ONDANSETRON HCL 4 MG/2ML IJ SOLN
INTRAMUSCULAR | Status: AC
  Filled 2024-01-03: qty 2

## 2024-01-03 MED ORDER — DEXAMETHASONE SOD PHOSPHATE PF 10 MG/ML IJ SOLN
INTRAMUSCULAR | Status: DC | PRN
  Administered 2024-01-03: 10 mg via INTRAVENOUS

## 2024-01-03 MED ORDER — ACETAMINOPHEN 10 MG/ML IV SOLN
INTRAVENOUS | Status: AC
  Filled 2024-01-03: qty 100

## 2024-01-03 MED ORDER — SUGAMMADEX SODIUM 200 MG/2ML IV SOLN
INTRAVENOUS | Status: AC
  Filled 2024-01-03: qty 2

## 2024-01-03 MED ORDER — OXYCODONE-ACETAMINOPHEN 5-325 MG PO TABS: 1.0000 | ORAL_TABLET | ORAL | 0 refills | Status: DC | PRN

## 2024-01-03 MED ORDER — HYDRALAZINE HCL 20 MG/ML IJ SOLN
INTRAMUSCULAR | Status: AC
Start: 2024-01-03 — End: 2024-01-03
  Filled 2024-01-03: qty 1

## 2024-01-03 MED ORDER — CEFAZOLIN SODIUM-DEXTROSE 2-4 GM/100ML-% IV SOLN
2.0000 g | INTRAVENOUS | Status: AC
  Administered 2024-01-03: 2 g via INTRAVENOUS
  Filled 2024-01-03: qty 100

## 2024-01-03 MED ORDER — ORAL CARE MOUTH RINSE: 15.0000 mL | Freq: Once | OROMUCOSAL | Status: AC

## 2024-01-03 MED ORDER — GEMCITABINE CHEMO FOR BLADDER INSTILLATION 2000 MG
2000.0000 mg | Freq: Once | INTRAVENOUS | Status: AC
  Administered 2024-01-03: 2000 mg via INTRAVESICAL
  Filled 2024-01-03: qty 2000

## 2024-01-03 MED ORDER — FENTANYL CITRATE (PF) 50 MCG/ML IJ SOSY
25.0000 ug | PREFILLED_SYRINGE | INTRAMUSCULAR | Status: DC | PRN
  Administered 2024-01-03 (×3): 50 ug via INTRAVENOUS

## 2024-01-03 MED ORDER — PROPOFOL 10 MG/ML IV BOLUS
INTRAVENOUS | Status: DC | PRN
  Administered 2024-01-03: 160 mg via INTRAVENOUS
  Administered 2024-01-03: 40 mg via INTRAVENOUS

## 2024-01-03 MED ORDER — FENTANYL CITRATE (PF) 100 MCG/2ML IJ SOLN
INTRAMUSCULAR | Status: DC | PRN
  Administered 2024-01-03: 50 ug via INTRAVENOUS
  Administered 2024-01-03: 100 ug via INTRAVENOUS

## 2024-01-03 MED ORDER — CHLORHEXIDINE GLUCONATE 0.12 % MT SOLN
15.0000 mL | Freq: Once | OROMUCOSAL | Status: AC
  Administered 2024-01-03: 15 mL via OROMUCOSAL

## 2024-01-03 SURGICAL SUPPLY — 16 items
BAG URINE DRAIN 2000ML AR STRL (UROLOGICAL SUPPLIES) IMPLANT
BAG URO CATCHER STRL LF (MISCELLANEOUS) ×1 IMPLANT
CATH FOLEY 2WAY SLVR 5CC 18FR (CATHETERS) IMPLANT
DRAPE FOOT SWITCH (DRAPES) ×1 IMPLANT
ELECT REM PT RETURN 15FT ADLT (MISCELLANEOUS) ×1 IMPLANT
EVACUATOR MICROVAS BLADDER (UROLOGICAL SUPPLIES) IMPLANT
GLOVE BIOGEL M 7.0 STRL (GLOVE) ×1 IMPLANT
GOWN STRL REUS W/ TWL XL LVL3 (GOWN DISPOSABLE) ×1 IMPLANT
HOLDER FOLEY CATH W/STRAP (MISCELLANEOUS) IMPLANT
KIT TURNOVER KIT A (KITS) ×1 IMPLANT
LOOP CUT BIPOLAR 24F LRG (ELECTROSURGICAL) IMPLANT
MANIFOLD NEPTUNE II (INSTRUMENTS) ×1 IMPLANT
PACK CYSTO (CUSTOM PROCEDURE TRAY) ×1 IMPLANT
SYRINGE TOOMEY IRRIG 70ML (MISCELLANEOUS) IMPLANT
TUBING CONNECTING 10 (TUBING) ×1 IMPLANT
TUBING UROLOGY SET (TUBING) ×1 IMPLANT

## 2024-01-03 NOTE — H&P (Signed)
 Office Visit Report     12/10/2023   --------------------------------------------------------------------------------   Billy Hernandez  MRN: [id number]  DOB: 01-Jun-1955, 68 year old Male  SSN:    PRIMARY CARE:  Billy N. Benita Caraway, MD  PRIMARY CARE FAX:  [phone]  REFERRING:  Billy FABIENE Siad, MD  PROVIDER:  Donnice Hernandez, M.D.  LOCATION:  [facility] 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 He was previously followed by Dr. Matilda.   1. High risk nonmuscle invasive bladder cancer:  Presented with gross hematuria. Only smoked as young child. No family history of urologic malignancy. CT hematuria protocol 12/2018 with 3.5 cm mass in the posterior right bladder.  -S/p TURBT 01/2019 with gemcitabine  for 3.5cm right trigonal tumor. Path: HG T1 UCB (muscle present and uninvolved.  -Completed 6/6 BCG induction in 04/2019 and completed 3 maintenance BCG cycles.  -S/p TURBT 10/2020 of a small papillary lesion at the bladder neck/prostatic junction. Pathology: HG Ta UCB. Base of bladder resection revealed no involvement.  -Did not undergo BCG following TURBT in 10/2020  -Cystoscopy 09/25/2022 with tumor protruding from the right ureteral orifice with adjacent mass affect from the distal right ureter. No papillary lesions within the bladder itself.  -S/p TURBT 10/24/2022 with HG Ta UCB, subcentimeter adjacent to right ureter.  -Cysto 02/07/2023: NED  -Cysto 06/25/2023: NED  -Cysto 11/01/2023: Approximately 4 separate papillary and nodular lesions seen on the posterior wall and right lateral wall each about 2 cm.  -S/p TURBT with gemcitabine  12/02/2023 with pathology: multifocal HG T1 UCB with inverted growth pattern. Muscle not present.   2. Right upper  tract urothelial carcinoma pT2N0M0:  -CT A/P 09/07/2022 with enhancing soft tissue in the distal right ureter extending to the UVJ suspicious for urothelial neoplasm. Also associated moderate to right severe hydroureteronephrosis with right renal cortical atrophy.  -Cystoscopy 09/25/2022 with tumor protruding from the right ureteral orifice with adjacent mass affect from the distal right ureter.  -S/p right ureteral tumor biopsy with noninvasive high-grade urothelial carcinoma 10/24/2022.  -S/p robotic assisted right laparoscopic nephroureterectomy 11/12/2022. 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  -Current treatment: adjuvant therapy for [facility] after resection with gem/carbo plan for 4 cycles   Surveillance imaging:  -CT C/A/P w contrast 12/20/2022: Small amount of nodular thickening along the right posterior aspect of bladder wall, posterior second scarring/postsurgical change. No evidence of metastatic disease.  -CT C/A/P 06/2023: NED; small thickening of right lateral wall, no focal lesion. No metastatic disease  -CT hematuria protocol 10/2023: 4 nodules throughout the bladder largest at the dome measuring 2 x 2 cm with multifocal bladder malignancy. No tissue in the nephrectomy bed. No lymphadenopathy or metastatic disease.   Surveillance cysto:  -Cysto 02/07/2023: Well-healing closure around prior placement of right ureteral orifice. No evidence of any recurrent papillary lesions.  -Cysto 06/25/2023: Well-healing closure around prior placement of right ureteral orifice. No evidence of any recurrent papillary lesions.   #2. Left renal cyst: CT mature protocol 12/2018 with 1.3 cm subcapsular lesion in the left interpolar kidney most likely complicated cyst. CT A/P 08/2022 with lateral left renal cyst that is Bosniak category 1. No further follow-up is required.   #3. Prostate cancer screening: He is adopted and unknown if he has history of prostate cancer. PSA in  08/2022  was normal at 2.97. He denies bothersome lower urinary tract symptoms. He does have 1 time nocturia.   #4. Erectile function: He has difficulty obtaining maintain erection. Sent in sildenafil.     ALLERGIES: No Allergies    MEDICATIONS: Lisinopril   Tamsulosin HCl 0.4 MG Capsule 2 capsule PO Q HS  busPIRone  HCl 10 MG Tablet  Colace  Sildenafil Citrate 100 MG Tablet 1 tablet PO Daily PRN  Trazodone  Hcl     GU PSH: Bladder Instill AntiCA Agent - 2022, 2022, 2022, 2021, 2021, 2021, 2021, 2021, 2021, 2021, 2021, 2021, 2021, 2021, 2021, 2020 Cysto Fulgurate < 0.5 cm - 2021 Cystoscopy - 11/01/2023, 06/25/2023, 02/07/2023, 09/25/2022, 2023, 2022, 2022, 2022, 2021, 2021, 2021, 2020 Cystoscopy Insert Stent, Right - 2020 Cystoscopy TURBT <2 cm - 2022 Cystoscopy TURBT 2-5 cm - 2020 Inject For cystogram - 11/20/2022 Locm 300-399Mg /Ml Iodine,1Ml - 09/07/2022, 2020     NON-GU PSH: Visit Complexity (formerly GPC1X) - 11/01/2023, 06/25/2023, 11/20/2022, 10/31/2022, 08/29/2022     GU PMH: Bladder Cancer overlapping sites - 11/14/2023, - 11/01/2023, - 06/25/2023, - 06/14/2023, - 02/07/2023, - 12/06/2022, - 11/20/2022, - 10/31/2022, - 09/25/2022, - 09/07/2022, - 08/29/2022, History of high-grade nonmuscle invasive bladder cancer. Cystoscopy today negative. He has had initial hematuria once recently. Urinalysis today was clear, - 2023, No evidence of recurrence today, - 2022, Small recurrence resected earlier this month, he is doing well, - 2022, He has a persistent, slightly enlarging polypoid lesion in his bladder neck area that I think should be biopsied., - 2022, - 2022, - 2022, No evidence recurrence based on cystoscopy today., - 2022, cystoscopy not worrisome today., - 2021, - 2021, - 2021, He does have 2 small papillary recurrences today there were treated with cautery., - 2021, - 2021, History of high-grade non muscle invasive bladder cancer, status post resection in November, 2020. No evidence recurrence cystoscopically  today., - 2021 (Stable), He tolerated initial TURBT very well. Recommended that he is initiated on a 6 wk course of BCG. We also discussed the potential for needing a repeat TURBT to screen for remaining disease and clean up if necessary. , - 2020 Encounter for Prostate Cancer screening - 11/01/2023, - 09/25/2022, - 08/29/2022 Malig Neo Ureteric Orifice - 11/01/2023, - 06/25/2023, - 02/07/2023, - 11/20/2022, - 10/31/2022, - 09/25/2022 BPH w/o LUTS - 06/25/2023, - 02/07/2023, - 12/06/2022, - 08/29/2022 Renal cyst - 06/14/2023, - 02/07/2023, - 08/29/2022 Acute Cystitis/UTI - 12/06/2022, - 11/23/2022 ED due to arterial insufficiency - 2022, He expresses interest in starting on sildenafil. , - 2020 Bladder tumor/neoplasm, Right, Bladder tumor present on right trigonal area -- likely low-grade and non-invasive but we will move forward with TURBT. - 2020 BPH w/LUTS, Prostate slightly enlarged though otherwise normal on exam. - 2020 Gross hematuria, Will evaluate w/ CT Bladder tumor present on cysto. - 2020 Nocturia - 2020    NON-GU PMH: No Non-GU PMH    FAMILY HISTORY: 2 sons - Son    Notes: adopted   SOCIAL HISTORY: Marital Status: Unknown Preferred Language: English; Ethnicity: Not Hispanic Or Latino; Race: White Current Smoking Status: Patient does not smoke anymore. Has not smoked since 12/25/1970. Smoked for 2 years. Smoked 1/2 pack per day.   Tobacco Use Assessment Completed: Used Tobacco in last 30 days? Does not use smokeless tobacco. Drinks 6 drinks per day. Types of alcohol consumed: Beer. Moderate Drinker.  Does not use drugs. Drinks 3 caffeinated drinks per day. Has not had a blood transfusion. 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  REVIEW OF SYSTEMS:    GU Review Male:   Patient denies frequent urination, hard to postpone urination, burning/ pain with urination, get up at night to urinate, leakage of urine, stream starts and stops, trouble starting your stream, have to strain to urinate , erection  problems, and penile pain.  Gastrointestinal (Upper):   Patient denies nausea, vomiting, and indigestion/ heartburn.  Gastrointestinal (Lower):   Patient denies diarrhea and constipation.  Constitutional:   Patient denies fever, night sweats, weight loss, and fatigue.  Skin:   Patient denies skin rash/ lesion and itching.  Eyes:   Patient denies blurred vision and double vision.  Ears/ Nose/ Throat:   Patient denies sore throat and sinus problems.  Hematologic/Lymphatic:   Patient denies swollen glands and easy bruising.  Cardiovascular:   Patient denies leg swelling and chest pains.  Respiratory:   Patient denies cough and shortness of breath.  Endocrine:   Patient denies excessive thirst.  Musculoskeletal:   Patient denies back pain and joint pain.  Neurological:   Patient denies headaches and dizziness.  Psychologic:   Patient denies depression and anxiety.   VITAL SIGNS: None   MULTI-SYSTEM PHYSICAL EXAMINATION:    Constitutional: Well-nourished. No physical deformities. Normally developed. Good grooming.  Respiratory: No labored breathing, no use of accessory muscles.   Cardiovascular: Normal temperature, normal extremity pulses, no swelling, no varicosities.  Gastrointestinal: No mass, no tenderness, no rigidity, non obese abdomen.     Complexity of Data:  Source Of History:  Patient, Medical Record Summary  Records Review:   Pathology Reports, Previous Doctor Records, Previous Patient Records  Urine Test Review:   Urinalysis   08/29/22  PSA  Total PSA 2.97 ng/mL    PROCEDURES:          Visit Complexity - G2211          Urinalysis w/Scope Dipstick Dipstick Cont'd Micro  Color: Yellow Bilirubin: Neg mg/dL WBC/hpf: NS (Not Seen)  Appearance: Clear Ketones: Neg mg/dL RBC/hpf: 0 - 2/hpf  Specific Gravity: 1.010 Blood: 2+ ery/uL Bacteria: NS (Not Seen)  pH: <=5.0 Protein: Neg mg/dL Cystals: NS (Not Seen)  Glucose: Neg mg/dL Urobilinogen: 0.2 mg/dL Casts: NS (Not Seen)     Nitrites: Neg Trichomonas: Not Present    Leukocyte Esterase: Neg leu/uL Mucous: Not Present      Epithelial Cells: 0 - 5/hpf      Yeast: NS (Not Seen)      Sperm: Not Present    ASSESSMENT:      ICD-10 Details  1 GU:   Bladder Cancer overlapping sites - C67.8   2   ED due to arterial insufficiency - N52.01    PLAN:            Medications New Meds: Sildenafil Citrate 100 MG Tablet 1 tablet PO Daily   #30  11 Refill(s)  Pharmacy Name:  [facility] [id number]  Address:  [address]   [place], [place] [address]  Phone:  [phone]  Fax:  [phone]            Schedule Return Visit/Planned Activity: Next Available Appointment - Schedule Surgery          Document Letter(s):  Created for Patient: Clinical Summary         Notes:    1. High risk nonmuscle invasive bladder cancer:  -TURBT with HG Ta UCB adjacent to right ureter. BCG was witheld given concomitant R UTUC and this was resected with R NUx.  -  Cystoscopy 11/01/2023: Approximately 4 separate papillary and nodular lesions seen on posterior wall and right lateral wall  -S/p TURBT with gemcitabine  12/02/2023 with pathology: multifocal HG T1 UCB with inverted growth pattern. Muscle not present.  -Reccomend re-resection TURBT to obtain muscle. Surgery letter sent.  - If no evidence of muscle-invasive disease, discussed option of cystectomy versus repeat attempt at BCG induction. If he were to fail another BCG injection, we will consider other options such as Keytruda.   Risks and benefits of Transurethral Resection of Bladder Tumor were reviewed in detail including infection, bleeding, blood transfusion, injury to bladder/urethra/surrounding structures, bladder perforation, obstructive and irritative voiding symptoms, and global anesthesia risks including but not limited to CVA, MI, DVT, PE, pneumonia, and death. Patient expressed understanding and desire to proceed.   #2. Right upper tract urothelial carcinoma:   -S/p robotic assisted right laparoscopic nephroureterectomy 11/12/2022. 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  -S/p adjuvant chemo by Dr. Tina   #3. Simple left renal cyst: We discussed this Bosniak category 3, no further follow-up is required.   #4. Prostate cancer screening: He is up-to-date with prostate cancer screening. Recommend PSA annually.   #5. Erectile dysfunction: Continue sildenafil. Sent today.   Urology Preoperative H&P   Chief Complaint: HG T1 UCB  History of Present Illness: Billy Hernandez is a 68 y.o. male with HG T1 UCB here for re-resection TURBT. Denies fevers, chills, dysuria.    Past Medical History:  Diagnosis Date   Adopted    per pt unknown family medical history   Allergic rhinitis    Anxiety    Arthritis    Bladder cancer [facility])    urologist-- dr matilda---  TURBT 11/ 2020   Chronic sinusitis    GERD (gastroesophageal reflux disease)    pt denies   History of 2019 novel coronavirus disease (COVID-19) 07/09/2019   positive result in epic, per pt no symptoms   History of bronchoscopy 11/24/2019   per pt this was done due to abnormal chest CT, sob, and hx bronchospasm 2020;  normal bronchoscopy with no lesion and no etiology of bronchospasms   History of Helicobacter pylori infection 11/2019   treated   Hypertension    Seasonal allergies     Past Surgical History:  Procedure Laterality Date   COLONOSCOPY WITH ESOPHAGOGASTRODUODENOSCOPY (EGD)  12/16/2019   CYSTOSCOPY WITH BIOPSY N/A 10/31/2020   Procedure: CYSTOSCOPY WITH TRANSURETRAL RESECTION OF  OF BLADDER NECK LESION;  Surgeon: matilda Senior, MD;  Location: [facility];  Service: Urology;  Laterality: N/A;   CYSTOSCOPY/URETEROSCOPY/HOLMIUM LASER/STENT PLACEMENT Right 10/24/2022   Procedure: CYSTOSCOPY/RIGHT URETEROSCOPY WITH  BIOPSY/RIGHT RETROGRADE PYELOGRAM/RIGHT STENT PLACEMENT;  Surgeon: Billy Billy JONELLE, MD;  Location: [facility];   Service: Urology;  Laterality: Right;   ROBOT ASSITED LAPAROSCOPIC NEPHROURETERECTOMY Right 11/12/2022   Procedure: XI RIGHT ROBOT ASSISTED LAPAROSCOPIC RADICAL NEPHROURETERECTOMY;  Surgeon: Billy Billy JONELLE, MD;  Location: [facility];  Service: Urology;  Laterality: Right;   SEPTOPLASTY WITH ETHMOIDECTOMY, AND MAXILLARY ANTROSTOMY  09/13/2020   TRANSURETHRAL RESECTION OF BLADDER TUMOR N/A 10/24/2022   Procedure: TRANSURETHRAL RESECTION OF BLADDER TUMOR (TURBT);  Surgeon: Billy Billy JONELLE, MD;  Location: [facility];  Service: Urology;  Laterality: N/A;   TRANSURETHRAL RESECTION OF BLADDER TUMOR WITH MITOMYCIN -C N/A 01/26/2019   Procedure: TRANSURETHRAL RESECTION OF BLADDER TUMOR WITH MITOMYCIN -C;  Surgeon: matilda Senior, MD;  Location: [facility];  Service: Urology;  Laterality: N/A;   VIDEO BRONCHOSCOPY N/A  11/24/2019   Procedure: VIDEO BRONCHOSCOPY WITHOUT FLUORO;  Surgeon: Neda Jennet LABOR, MD;  Location: [facility];  Service: Pulmonary;  Laterality: N/A;    Allergies: No Known Allergies  Family History  Adopted: Yes  Problem Relation Age of Onset   Colon cancer Neg Hx    Esophageal cancer Neg Hx    Rectal cancer Neg Hx    Stomach cancer Neg Hx    Colon polyps Neg Hx     Social History:  reports that he quit smoking about 51 years ago. His smoking use included cigarettes. He started smoking about 53 years ago. He has never been exposed to tobacco smoke. He has never used smokeless tobacco. He reports current alcohol use of about 42.0 standard drinks of alcohol per week. He reports that he does not use drugs.  ROS: A complete review of systems was performed.  All systems are negative except for pertinent findings as noted.  Physical Exam:  Vital signs in last 24 hours: Temp:  [98.1 F (36.7 C)] 98.1 F (36.7 C) (10/10 0703) Pulse Rate:  [66] 66 (10/10 0703) Resp:  [18] 18 (10/10 0703) BP: (172)/(90) 172/90 (10/10 0703) SpO2:  [96 %] 96 % (10/10 0703) Weight:  [83.9  kg] 83.9 kg (10/10 0659) Constitutional:  Alert and oriented, No acute distress Cardiovascular: Regular rate and rhythm Respiratory: Normal respiratory effort, Lungs clear bilaterally GI: Abdomen is soft, nontender, nondistended, no abdominal masses GU: No CVA tenderness Lymphatic: No lymphadenopathy Neurologic: Grossly intact, no focal deficits Psychiatric: Normal mood and affect  Laboratory Data:  No results for input(s): WBC, HGB, HCT, PLT in the last 72 hours.  No results for input(s): NA, K, CL, GLUCOSE, BUN, CALCIUM, CREATININE in the last 72 hours.  Invalid input(s): CO3   No results found for this or any previous visit (from the past 24 hours). No results found for this or any previous visit (from the past 240 hours).  Renal Function: Recent Labs    12/30/23 1503  CREATININE 0.96   Estimated Creatinine Clearance: 78.6 mL/min (by C-G formula based on SCr of 0.96 mg/dL).  Radiologic Imaging: No results found.  I independently reviewed the above imaging studies.  Assessment and Plan Billy Hernandez is a 68 y.o. male with HG T1 UCB here for re-resection TURBT with gemcitabine .   Billy R. Asar Evilsizer MD 01/03/2024, 8:38 AM  [facility] Pager: [phone]

## 2024-01-03 NOTE — Anesthesia Procedure Notes (Signed)
 Procedure Name: Intubation Date/Time: 01/03/2024 9:03 AM  Performed by: Carleton Garnette SAUNDERS, CRNAPre-anesthesia Checklist: Patient identified, Emergency Drugs available, Suction available, Patient being monitored and Timeout performed Patient Re-evaluated:Patient Re-evaluated prior to induction Oxygen Delivery Method: Circle system utilized Preoxygenation: Pre-oxygenation with 100% oxygen Induction Type: IV induction Ventilation: Mask ventilation without difficulty and Oral airway inserted - appropriate to patient size Laryngoscope Size: Mac and 4 Grade View: Grade I Tube size: 7.5 mm Number of attempts: 1 Airway Equipment and Method: Stylet Placement Confirmation: ETT inserted through vocal cords under direct vision, breath sounds checked- equal and bilateral and positive ETCO2 Secured at: 23 cm Tube secured with: Tape Dental Injury: Teeth and Oropharynx as per pre-operative assessment

## 2024-01-03 NOTE — Discharge Instructions (Signed)
Activity:  You are encouraged to ambulate frequently (about every hour during waking hours) to help prevent blood clots from forming in your legs or lungs.   ? ?Diet: You should advance your diet as instructed by your physician.  It will be normal to have some bloating, nausea, and abdominal discomfort intermittently. ? ?Prescriptions:  You will be provided a prescription for pain medication to take as needed.  If your pain is not severe enough to require the prescription pain medication, you may take extra strength Tylenol instead which will have less side effects.  You should also take a prescribed stool softener to avoid straining with bowel movements as the prescription pain medication may constipate you. ? ?What to call us about: You should call the office ([phone]) if you develop fever > 101 or develop persistent vomiting. Activity:  You are encouraged to ambulate frequently (about every hour during waking hours) to help prevent blood clots from forming in your legs or lungs.  ?

## 2024-01-03 NOTE — Transfer of Care (Signed)
 Immediate Anesthesia Transfer of Care Note  Patient: Billy Hernandez  Procedure(s) Performed: TURBT, WITH CHEMOTHERAPEUTIC AGENT INSTILLATION INTO BLADDER (Bladder)  Patient Location: PACU  Anesthesia Type:General  Level of Consciousness: sedated  Airway & Oxygen Therapy: Patient Spontanous Breathing and Patient connected to face mask oxygen  Post-op Assessment: Report given to RN and Post -op Vital signs reviewed and stable  Post vital signs: Reviewed and stable  Last Vitals:  Vitals Value Taken Time  BP    Temp    Pulse 61 01/03/24 09:52  Resp 12 01/03/24 09:52  SpO2 100 % 01/03/24 09:52  Vitals shown include unfiled device data.  Last Pain:  Vitals:   01/03/24 0703  TempSrc: Oral         Complications: No notable events documented.

## 2024-01-03 NOTE — Anesthesia Preprocedure Evaluation (Signed)
 Anesthesia Evaluation  Patient identified by MRN, date of birth, ID band Patient awake    Reviewed: Allergy & Precautions, NPO status , Patient's Chart, lab work & pertinent test results  History of Anesthesia Complications Negative for: history of anesthetic complications  Airway Mallampati: II  TM Distance: >3 FB Neck ROM: Full    Dental  (+) Dental Advisory Given,    Pulmonary former smoker   breath sounds clear to auscultation       Cardiovascular hypertension, Pt. on medications  Rhythm:Regular Rate:Normal     Neuro/Psych   Anxiety     negative neurological ROS     GI/Hepatic Neg liver ROS,GERD  ,,  Endo/Other  negative endocrine ROS    Renal/GU negative Renal ROS     Musculoskeletal  (+) Arthritis ,    Abdominal   Peds  Hematology  (+) Blood dyscrasia, anemia   Anesthesia Other Findings Bladder cancer  Reproductive/Obstetrics                              Anesthesia Physical Anesthesia Plan  ASA: 2  Anesthesia Plan: General   Post-op Pain Management: Tylenol  PO (pre-op)*   Induction: Intravenous  PONV Risk Score and Plan: 3 and Ondansetron , Dexamethasone  and Midazolam   Airway Management Planned: Oral ETT  Additional Equipment: None  Intra-op Plan:   Post-operative Plan: Extubation in OR  Informed Consent: I have reviewed the patients History and Physical, chart, labs and discussed the procedure including the risks, benefits and alternatives for the proposed anesthesia with the patient or authorized representative who has indicated his/her understanding and acceptance.     Dental advisory given  Plan Discussed with: CRNA  Anesthesia Plan Comments:         Anesthesia Quick Evaluation

## 2024-01-03 NOTE — Op Note (Signed)
 Operative Note  Preoperative diagnosis:  1.  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  Same  Procedure(s): 1.  TURBT large 2. Instillation of gemcitabine   Surgeon: Donnice Siad, MD  Assistants:  None  Anesthesia:  Billy  Complications:  None  EBL:  Minimal  Specimens: 1.  ID Type Source Tests Collected by Time Destination  1 : right trigone bladder tumor Tissue PATH GU resection / TURBT / partial nephrectomy SURGICAL PATHOLOGY Siad Donnice SAUNDERS, MD 01/03/2024 [phone]   2 : right posterior bladder wall prior resection site Tissue PATH GU resection / TURBT / partial nephrectomy SURGICAL PATHOLOGY Siad Donnice SAUNDERS, MD 01/03/2024 0919   3 : right lateral wall prior resection site Tissue PATH GU resection / TURBT / partial nephrectomy SURGICAL PATHOLOGY Siad Donnice SAUNDERS, MD 01/03/2024 [phone]   4 : bladder dome prior resection site Tissue PATH GU resection / TURBT / partial nephrectomy SURGICAL PATHOLOGY Siad Donnice SAUNDERS, MD 01/03/2024 [phone]    Drains/Catheters: 1.  18 French foley  Intraoperative findings:   4 separate prior resection sites with debris present, however no obvious residual tumor, no papillary lesions. Each site resected deeply to the level of the muscle with excellent hemostasis  Number of tumors:          4 Size of largest tumor:      4x4cm area of resection at largest site on the left trigone  Characteristics of tumors:    Nodular debris  Recurrent  Yes  Suspicious for Carcinoma in situ:    No  Clinical tumor stage:     cT1  Bimanual exam under anesthesia:        Yes, no 3D mass  Visually complete resection:                 Yes  Visualization of detrusor muscle in resection base:        Yes  Visual evaluation for perforation:             Performed - no evidence of perforation   Indication:  Billy Hernandez is a 68 y.o. male with a history of HG T1 UCB here for re-resection. All the risks, benefits were discussed with the patient  to include but not limited to infection, pain, bleeding, damage to adjacent structures, need for further operations, adverse reaction to anesthesia and death.  Patient understands these risks and agrees to proceed with the operation as planned.    Description of procedure: After informed consent was obtained from the patient, the patient was taken to the operating room. Billy anesthesia was administered. The patient was placed in dorsal lithotomy position and prepped and draped in usual sterile fashion. Sequential compression devices were applied to lower extremities at the beginning of the case for DVT prophylaxis. Antibiotics were infused prior to surgery start time. A surgical time-out was performed to properly identify the patient, the surgery to be performed, and the surgical site.     We then passed the 21-French rigid cystoscope down the urethra and into the bladder under direct vision without any difficulty. The anterior urethral was normal. The prostate was non-obstructing. The bladder was inspected with 30 and 70 degree lenses. Once in the bladder, systematic evaluation of bladder revealed 4 prior resection sites with ischemic debris present, however no obvious recurrent tumors. The left ureteral orifice was in orthotopic position and not involved   We then removed the cystoscope and then passed down the 26 French resectoscope  sheath down the urethra into the bladder under direct vision with the visual obturator. The tumor was resected down to muscle. The TUR bladder tumor chips were retrieved from the bladder and each region of resection was passed off the field as a separate specimen.  Hemostasis was achieved using electrocautery. We then proceeded with removing the resectoscope and then placed in an 18 French Foley catheter. The patient tolerated the procedure well with no complication and was awoken from anesthesia and taken to recovery in stable condition.     While in the post-operative  care unit, as a separate procedure, 2000 mg of gemcitabine  in 50 mL of water  was instilled in the bladder through the catheter and the catheter was plugged. This will remain indwelling for approximately one hour. It will then be drained from the bladder and the catheter will be removed and the patient discharged home.  Matt R. Ayriana Wix MD [facility]  Pager: [phone]

## 2024-01-03 NOTE — Anesthesia Postprocedure Evaluation (Signed)
 Anesthesia Post Note  Patient: Billy Hernandez  Procedure(s) Performed: TURBT, WITH CHEMOTHERAPEUTIC AGENT INSTILLATION INTO BLADDER (Bladder)     Patient location during evaluation: PACU Anesthesia Type: General Level of consciousness: awake and alert Pain management: pain level controlled Vital Signs Assessment: post-procedure vital signs reviewed and stable Respiratory status: spontaneous breathing, nonlabored ventilation, respiratory function stable and patient connected to nasal cannula oxygen Cardiovascular status: blood pressure returned to baseline and stable Postop Assessment: no apparent nausea or vomiting Anesthetic complications: no   No notable events documented.  Last Vitals:  Vitals:   01/03/24 1215 01/03/24 1250  BP: (!) 168/83 (!) 146/73  Pulse: 74 86  Resp: 17 16  Temp:    SpO2: 97% 95%    Last Pain:  Vitals:   01/03/24 1250  TempSrc:   PainSc: 2                  Billy Hernandez

## 2024-01-06 LAB — SURGICAL PATHOLOGY

## 2024-01-08 ENCOUNTER — Ambulatory Visit (INDEPENDENT_AMBULATORY_CARE_PROVIDER_SITE_OTHER): Admitting: Family Medicine

## 2024-01-08 ENCOUNTER — Encounter: Payer: Self-pay | Admitting: Family Medicine

## 2024-01-08 VITALS — BP 140/82 | HR 65 | Temp 98.0°F | Ht 72.0 in | Wt 195.0 lb

## 2024-01-08 DIAGNOSIS — R5383 Other fatigue: Secondary | ICD-10-CM

## 2024-01-08 DIAGNOSIS — K635 Polyp of colon: Secondary | ICD-10-CM

## 2024-01-08 DIAGNOSIS — Z1211 Encounter for screening for malignant neoplasm of colon: Secondary | ICD-10-CM

## 2024-01-08 DIAGNOSIS — F5101 Primary insomnia: Secondary | ICD-10-CM | POA: Diagnosis not present

## 2024-01-08 DIAGNOSIS — E663 Overweight: Secondary | ICD-10-CM | POA: Diagnosis not present

## 2024-01-08 DIAGNOSIS — Z7689 Persons encountering health services in other specified circumstances: Secondary | ICD-10-CM

## 2024-01-08 DIAGNOSIS — I10 Essential (primary) hypertension: Secondary | ICD-10-CM

## 2024-01-08 LAB — LIPID PANEL
Cholesterol: 222 mg/dL — ABNORMAL HIGH (ref 0–200)
HDL: 71.6 mg/dL (ref >39.00)
LDL Cholesterol: 137 mg/dL — ABNORMAL HIGH (ref 0–99)
NonHDL: 150.84
Total CHOL/HDL Ratio: 3
Triglycerides: 70 mg/dL (ref 0.0–149.0)
VLDL: 14 mg/dL (ref 0.0–40.0)

## 2024-01-08 LAB — HEMOGLOBIN A1C: Hgb A1c MFr Bld: 5.3 % (ref 4.6–6.5)

## 2024-01-08 LAB — VITAMIN D 25 HYDROXY (VIT D DEFICIENCY, FRACTURES): VITD: 28.8 ng/mL — ABNORMAL LOW (ref 30.00–100.00)

## 2024-01-08 LAB — TSH: TSH: 1.48 u[IU]/mL (ref 0.35–5.50)

## 2024-01-08 LAB — VITAMIN B12: Vitamin B-12: 173 pg/mL — ABNORMAL LOW (ref 211–911)

## 2024-01-08 NOTE — Progress Notes (Signed)
 New Patient Office Visit  Subjective   Patient ID: Billy Hernandez, male    DOB: Jul 14, 1955  Age: 68 y.o. MRN: [id number]  CC:  Chief Complaint  Patient presents with   Establish Care    HPI Billy Hernandez presents to establish care with new provider.  Patients previous primary care was [facility] with Dr. Isaiah Cones.  Specialist: [facility] with Dr. Donnice Siad  [facility] Darryle Long-Dr. Donnajean Chihuahua   HTN: Chronic. Patient is taking Lisinopril  10mg  daily. Denies monitoring blood pressure at home. Denies any CP, HA, SHOB, dizziness, lightheadedness, or lower extremity edema.  BP Readings from Last 3 Encounters:  01/08/24 (!) 140/82  01/03/24 (!) 146/73  12/30/23 (!) 152/82    Insomnia: Chronic. Patient is taking Trazodone . He is uncertain of the correct dose. The dose in Epic is from 2021. He reports he got his last refill about a month ago and it has been effective.   All other medications are either supplements or managed by specialist.  Outpatient Encounter Medications as of 01/08/2024  Medication Sig   b complex vitamins capsule Take 1 capsule by mouth daily.   Cholecalciferol (VITAMIN D-3 PO) Take 1 tablet by mouth in the morning.   lisinopril  (ZESTRIL ) 10 MG tablet Take 10 mg by mouth in the morning. (Patient taking differently: Take 10 mg by mouth in the morning and at bedtime.)   sildenafil (VIAGRA) 100 MG tablet Take 50 mg by mouth daily as needed for erectile dysfunction.   tamsulosin (FLOMAX) 0.4 MG CAPS capsule Take 0.4 mg by mouth in the morning and at bedtime.   traZODone  (DESYREL ) 50 MG tablet Take 75 mg by mouth at bedtime.   oxyCODONE -acetaminophen  (PERCOCET) 5-325 MG tablet Take 1 tablet by mouth every 4 (four) hours as needed for up to 18 doses for severe pain (pain score 7-10). (Patient not taking: Reported on 01/08/2024)   No facility-administered encounter medications on file as of 01/08/2024.    Past Medical History:  Diagnosis  Date   Adopted    per pt unknown family medical history   Allergic rhinitis    Anxiety    Arthritis    Bladder cancer [facility])    urologist-- dr matilda---  TURBT 11/ 2020   Chronic sinusitis    GERD (gastroesophageal reflux disease)    pt denies   History of 2019 novel coronavirus disease (COVID-19) 07/09/2019   positive result in epic, per pt no symptoms   History of bronchoscopy 11/24/2019   per pt this was done due to abnormal chest CT, sob, and hx bronchospasm 2020;  normal bronchoscopy with no lesion and no etiology of bronchospasms   History of Helicobacter pylori infection 11/2019   treated   Hypertension    Seasonal allergies     Past Surgical History:  Procedure Laterality Date   COLONOSCOPY WITH ESOPHAGOGASTRODUODENOSCOPY (EGD)  12/16/2019   CYSTOSCOPY WITH BIOPSY N/A 10/31/2020   Procedure: CYSTOSCOPY WITH TRANSURETRAL RESECTION OF  OF BLADDER NECK LESION;  Surgeon: matilda Senior, MD;  Location: [facility];  Service: Urology;  Laterality: N/A;   CYSTOSCOPY/URETEROSCOPY/HOLMIUM LASER/STENT PLACEMENT Right 10/24/2022   Procedure: CYSTOSCOPY/RIGHT URETEROSCOPY WITH  BIOPSY/RIGHT RETROGRADE PYELOGRAM/RIGHT STENT PLACEMENT;  Surgeon: Siad Donnice JONELLE, MD;  Location: [facility];  Service: Urology;  Laterality: Right;   ROBOT ASSITED LAPAROSCOPIC NEPHROURETERECTOMY Right 11/12/2022   Procedure: XI RIGHT ROBOT ASSISTED LAPAROSCOPIC RADICAL NEPHROURETERECTOMY;  Surgeon: Siad Donnice JONELLE, MD;  Location: [facility];  Service:  Urology;  Laterality: Right;   SEPTOPLASTY WITH ETHMOIDECTOMY, AND MAXILLARY ANTROSTOMY  09/13/2020   TRANSURETHRAL RESECTION OF BLADDER TUMOR N/A 10/24/2022   Procedure: TRANSURETHRAL RESECTION OF BLADDER TUMOR (TURBT);  Surgeon: Selma Donnice SAUNDERS, MD;  Location: [facility];  Service: Urology;  Laterality: N/A;   TRANSURETHRAL RESECTION OF BLADDER TUMOR WITH MITOMYCIN -C N/A 01/26/2019   Procedure: TRANSURETHRAL RESECTION OF BLADDER TUMOR WITH MITOMYCIN -C;   Surgeon: Matilda Senior, MD;  Location: [facility];  Service: Urology;  Laterality: N/A;   VIDEO BRONCHOSCOPY N/A 11/24/2019   Procedure: VIDEO BRONCHOSCOPY WITHOUT FLUORO;  Surgeon: Neda Jennet LABOR, MD;  Location: [facility];  Service: Pulmonary;  Laterality: N/A;    Family History  Adopted: Yes  Problem Relation Age of Onset   Colon cancer Neg Hx    Esophageal cancer Neg Hx    Rectal cancer Neg Hx    Stomach cancer Neg Hx    Colon polyps Neg Hx     Social History   Socioeconomic History   Marital status: Married    Spouse name: Not on file   Number of children: 2   Years of education: Not on file   Highest education level: High school graduate  Occupational History   Not on file  Tobacco Use   Smoking status: Former    Current packs/day: 0.00    Types: Cigarettes    Start date: 01/22/1970    Quit date: 01/23/1972    Years since quitting: 52.0    Passive exposure: Never   Smokeless tobacco: Never  Vaping Use   Vaping status: Never Used  Substance and Sexual Activity   Alcohol use: Yes    Alcohol/week: 42.0 standard drinks of alcohol    Types: 42 Cans of beer per week    Comment: 3-4 per day   Drug use: Never   Sexual activity: Not Currently    Comment: vasectomy  Other Topics Concern   Not on file  Social History Narrative   Not on file   Social Drivers of Health   Financial Resource Strain: Low Risk  (01/08/2024)   Overall Financial Resource Strain (CARDIA)    Difficulty of Paying Living Expenses: Not hard at all  Food Insecurity: No Food Insecurity (11/13/2022)   Hunger Vital Sign    Worried About Running Out of Food in the Last Year: Never true    Ran Out of Food in the Last Year: Never true  Transportation Needs: No Transportation Needs (11/13/2022)   PRAPARE - Administrator, Civil Service (Medical): No    Lack of Transportation (Non-Medical): No  Physical Activity: Sufficiently Active (01/08/2024)   Exercise Vital  Sign    Days of Exercise per Week: 5 days    Minutes of Exercise per Session: 40 min  Stress: No Stress Concern Present (01/08/2024)   [employer] of Occupational Health - Occupational Stress Questionnaire    Feeling of Stress: Not at all  Social Connections: Moderately Integrated (01/08/2024)   Social Connection and Isolation Panel    Frequency of Communication with Friends and Family: More than three times a week    Frequency of Social Gatherings with Friends and Family: Twice a week    Attends Religious Services: More than 4 times per year    Active Member of [employer] or Organizations: No    Attends Banker Meetings: Never    Marital Status: Married  Catering manager Violence: Not At Risk (11/13/2022)   Humiliation, Afraid, Rape, and  Kick questionnaire    Fear of Current or Ex-Partner: No    Emotionally Abused: No    Physically Abused: No    Sexually Abused: No    ROS See HPI above    Objective  BP (!) 140/82   Pulse 65   Temp 98 F (36.7 C) (Oral)   Ht 6' (1.829 m)   Wt 195 lb (88.5 kg)   SpO2 98%   BMI 26.45 kg/m   Physical Exam Vitals reviewed.  Constitutional:      General: He is not in acute distress.    Appearance: Normal appearance. He is not ill-appearing, toxic-appearing or diaphoretic.  HENT:     Head: Normocephalic and atraumatic.  Eyes:     General:        Right eye: No discharge.        Left eye: No discharge.     Conjunctiva/sclera: Conjunctivae normal.  Cardiovascular:     Rate and Rhythm: Normal rate and regular rhythm.     Heart sounds: Normal heart sounds. No murmur heard.    No friction rub. No gallop.  Pulmonary:     Effort: Pulmonary effort is normal. No respiratory distress.     Breath sounds: Normal breath sounds.  Musculoskeletal:        General: Normal range of motion.  Skin:    General: Skin is warm and dry.  Neurological:     General: No focal deficit present.     Mental Status: He is alert and oriented to  person, place, and time. Mental status is at baseline.  Psychiatric:        Mood and Affect: Mood normal.        Behavior: Behavior normal.        Thought Content: Thought content normal.        Judgment: Judgment normal.       Assessment & Plan:  Primary hypertension Assessment & Plan: -Blood pressure is elevated and has been elevated two other appointments. Recommend to increase Lisinopril  to 20mg  daily from 10mg  daily. May take (2) 10mg  tablets to equal 20mg . Recommend to monitor blood pressure twice a day for 2 weeks and bring reading in 2 weeks. CMP recently completed and kidney function was stable.    Primary insomnia Assessment & Plan: Will call pharmacy to see the correct dose for Trazodone  and refill medication.    Colon cancer screening -     Ambulatory referral to Gastroenterology  Polyp of colon, unspecified part of colon, unspecified type -     Ambulatory referral to Gastroenterology  Fatigue, unspecified type -     VITAMIN D 25 Hydroxy (Vit-D Deficiency, Fractures) -     Vitamin B12 -     TSH -     Hemoglobin A1c  Overweight (BMI 25.0-29.9) -     VITAMIN D 25 Hydroxy (Vit-D Deficiency, Fractures) -     TSH -     Lipid panel -     Hemoglobin A1c  Encounter to establish care  1.Review health maintenance:  -Covid booster: Declines -Tdap vaccine: May obtain at your local pharmacy -PNA vaccine: May obtain at your local pharmacy  -AWV: Declines -Influenza vaccine: May obtain at local pharmacy  -Hep C: Not had   Return in about 2 weeks (around 01/22/2024) for follow-up.   Emmanuela Ghazi, NP

## 2024-01-10 ENCOUNTER — Ambulatory Visit: Payer: Self-pay | Admitting: Family Medicine

## 2024-01-10 ENCOUNTER — Other Ambulatory Visit: Payer: Self-pay

## 2024-01-10 DIAGNOSIS — F5101 Primary insomnia: Secondary | ICD-10-CM | POA: Insufficient documentation

## 2024-01-10 DIAGNOSIS — I10 Essential (primary) hypertension: Secondary | ICD-10-CM | POA: Insufficient documentation

## 2024-01-10 DIAGNOSIS — E538 Deficiency of other specified B group vitamins: Secondary | ICD-10-CM

## 2024-01-10 DIAGNOSIS — E559 Vitamin D deficiency, unspecified: Secondary | ICD-10-CM

## 2024-01-10 DIAGNOSIS — E785 Hyperlipidemia, unspecified: Secondary | ICD-10-CM

## 2024-01-10 MED ORDER — TRAZODONE HCL 50 MG PO TABS: 75.0000 mg | ORAL_TABLET | Freq: Every day | ORAL | 0 refills | Status: DC

## 2024-01-10 MED ORDER — ROSUVASTATIN CALCIUM 10 MG PO TABS: 10.0000 mg | ORAL_TABLET | Freq: Every day | ORAL | 3 refills | Status: DC

## 2024-01-10 NOTE — Assessment & Plan Note (Signed)
 Will call pharmacy to see the correct dose for Trazodone  and refill medication.

## 2024-01-10 NOTE — Assessment & Plan Note (Signed)
-  Blood pressure is elevated and has been elevated two other appointments. Recommend to increase Lisinopril  to 20mg  daily from 10mg  daily. May take (2) 10mg  tablets to equal 20mg . Recommend to monitor blood pressure twice a day for 2 weeks and bring reading in 2 weeks. CMP recently completed and kidney function was stable.

## 2024-01-10 NOTE — Patient Instructions (Addendum)
-  It was nice to meet you and look forward to taking care of you.  -May obtain vaccines at your local pharmacy.  -Placed a referral to gastroenterology for colonoscopy. Please call the office or send a MyChart message if you do not receive a phone call or a MyChart message about appointment in 2 weeks.  -Blood pressure is elevated and has been elevated two other appointments. Recommend to increase Lisinopril  to 20mg  daily from 10mg  daily. May take (2) 10mg  tablets to equal 20mg . Recommend to monitor blood pressure twice a day for 2 weeks and bring reading in 2 weeks. -Continue all medications. Will call pharmacy to see the correct dose for Trazodone  and refill medication.  -Ordered labs. Office will call with results and will be available via MyChart.  -Follow up in 2 weeks.

## 2024-01-12 ENCOUNTER — Other Ambulatory Visit: Payer: Self-pay

## 2024-01-14 ENCOUNTER — Ambulatory Visit (INDEPENDENT_AMBULATORY_CARE_PROVIDER_SITE_OTHER)

## 2024-01-14 DIAGNOSIS — E538 Deficiency of other specified B group vitamins: Secondary | ICD-10-CM

## 2024-01-14 MED ORDER — CYANOCOBALAMIN 1000 MCG/ML IJ SOLN
1000.0000 ug | Freq: Once | INTRAMUSCULAR | Status: AC
  Administered 2024-01-14: 1000 ug via INTRAMUSCULAR

## 2024-01-14 NOTE — Progress Notes (Signed)
 Patient is in office today for a nurse visit for B12 Injection. Patient Injection was given in the  Right deltoid. Patient tolerated injection well.

## 2024-01-21 ENCOUNTER — Ambulatory Visit

## 2024-02-04 ENCOUNTER — Ambulatory Visit (INDEPENDENT_AMBULATORY_CARE_PROVIDER_SITE_OTHER)

## 2024-02-04 VITALS — Ht 72.0 in | Wt 195.0 lb

## 2024-02-04 DIAGNOSIS — Z0000 Encounter for general adult medical examination without abnormal findings: Secondary | ICD-10-CM | POA: Diagnosis not present

## 2024-02-04 NOTE — Patient Instructions (Addendum)
 Mr. Billy Hernandez,  Thank you for taking the time for your Medicare Wellness Visit. I appreciate your continued commitment to your health goals. Please review the care plan we discussed, and feel free to reach out if I can assist you further.  Please note that Annual Wellness Visits do not include a physical exam. Some assessments may be limited, especially if the visit was conducted virtually. If needed, we may recommend an in-person follow-up with your provider.  Ongoing Care Seeing your primary care provider every 3 to 6 months helps us  monitor your health and provide consistent, personalized care. Last office visit on 01/08/2024.  You are due for a Flu vaccine, a pneumonia vaccine and a tetanus vaccine, which can be given at your local pharmacy.    Referrals If a referral was made during today's visit and you haven't received any updates within two weeks, please contact the referred provider directly to check on the status.  Please call to get scheduled for a colonoscopy: [facility] Located in: [facility] [address] Address: [address] [place] [address], [place], [place] [address] Phone: [phone]  Recommended Screenings:  Health Maintenance  Topic Date Due   Hepatitis C Screening  Never done   DTaP/Tdap/Td vaccine (1 - Tdap) Never done   Pneumococcal Vaccine for age over 42 (1 of 2 - PCV) Never done   Colon Cancer Screening  12/16/2022   Flu Shot  10/25/2023   COVID-19 Vaccine (6 - 2025-26 season) 11/25/2023   Medicare Annual Wellness Visit  02/03/2025   Zoster (Shingles) Vaccine  Completed   Meningitis B Vaccine  Aged Out       02/04/2024   10:11 AM  Advanced Directives  Does Patient Have a Medical Advance Directive? No    Vision: Annual vision screenings are recommended for early detection of glaucoma, cataracts, and diabetic retinopathy. These exams can also reveal signs of chronic conditions such as diabetes and high blood  pressure. There are several Eye Doctors in your area. Here are a few that usually accept all insurance types:  [facility] [address] [place], [place] [address] Phone: [phone]  [facility] [address] [place], [place] [address] Phone: [phone]  [facility] [address] [place], [place] [address] Phone: [phone]  [facility] [address] [place], [place] [address] Phone: [phone]  [facility] [address] [place], [place] [address] Phone: [phone]  Please let us  know if you require a referral for an eye exam appointment. Thank you!    Dental: Annual dental screenings help detect early signs of oral cancer, gum disease, and other conditions linked to overall health, including heart disease and diabetes.  Please see the attached documents for additional preventive care recommendations.

## 2024-02-04 NOTE — Progress Notes (Signed)
 Subjective:   Billy Hernandez is a 68 y.o. male who presents for a Medicare Annual Wellness Visit.  Allergies (verified) Patient has no known allergies.   History: Past Medical History:  Diagnosis Date   Adopted    per pt unknown family medical history   Allergic rhinitis    Anxiety    Arthritis    Bladder cancer [facility])    urologist-- dr matilda---  TURBT 11/ 2020   Chronic sinusitis    GERD (gastroesophageal reflux disease)    pt denies   History of 2019 novel coronavirus disease (COVID-19) 07/09/2019   positive result in epic, per pt no symptoms   History of bronchoscopy 11/24/2019   per pt this was done due to abnormal chest CT, sob, and hx bronchospasm 2020;  normal bronchoscopy with no lesion and no etiology of bronchospasms   History of Helicobacter pylori infection 11/2019   treated   Hypertension    Seasonal allergies    Past Surgical History:  Procedure Laterality Date   COLONOSCOPY WITH ESOPHAGOGASTRODUODENOSCOPY (EGD)  12/16/2019   CYSTOSCOPY WITH BIOPSY N/A 10/31/2020   Procedure: CYSTOSCOPY WITH TRANSURETRAL RESECTION OF  OF BLADDER NECK LESION;  Surgeon: Matilda Senior, MD;  Location: [facility];  Service: Urology;  Laterality: N/A;   CYSTOSCOPY/URETEROSCOPY/HOLMIUM LASER/STENT PLACEMENT Right 10/24/2022   Procedure: CYSTOSCOPY/RIGHT URETEROSCOPY WITH  BIOPSY/RIGHT RETROGRADE PYELOGRAM/RIGHT STENT PLACEMENT;  Surgeon: Selma Donnice JONELLE, MD;  Location: [facility];  Service: Urology;  Laterality: Right;   ROBOT ASSITED LAPAROSCOPIC NEPHROURETERECTOMY Right 11/12/2022   Procedure: XI RIGHT ROBOT ASSISTED LAPAROSCOPIC RADICAL NEPHROURETERECTOMY;  Surgeon: Selma Donnice JONELLE, MD;  Location: [facility];  Service: Urology;  Laterality: Right;   SEPTOPLASTY WITH ETHMOIDECTOMY, AND MAXILLARY ANTROSTOMY  09/13/2020   TRANSURETHRAL RESECTION OF BLADDER TUMOR N/A 10/24/2022   Procedure: TRANSURETHRAL RESECTION OF BLADDER TUMOR (TURBT);  Surgeon: Selma Donnice JONELLE, MD;   Location: [facility];  Service: Urology;  Laterality: N/A;   TRANSURETHRAL RESECTION OF BLADDER TUMOR WITH MITOMYCIN -C N/A 01/26/2019   Procedure: TRANSURETHRAL RESECTION OF BLADDER TUMOR WITH MITOMYCIN -C;  Surgeon: Matilda Senior, MD;  Location: [facility];  Service: Urology;  Laterality: N/A;   VIDEO BRONCHOSCOPY N/A 11/24/2019   Procedure: VIDEO BRONCHOSCOPY WITHOUT FLUORO;  Surgeon: Neda Jennet LABOR, MD;  Location: [facility];  Service: Pulmonary;  Laterality: N/A;   Family History  Adopted: Yes  Problem Relation Age of Onset   Colon cancer Neg Hx    Esophageal cancer Neg Hx    Rectal cancer Neg Hx    Stomach cancer Neg Hx    Colon polyps Neg Hx    Social History   Occupational History   Occupation: [employer] RET[employer]  Tobacco Use   Smoking status: Former    Current packs/day: 0.00    Types: Cigarettes    Start date: 01/22/1970    Quit date: 01/23/1972    Years since quitting: 52.0    Passive exposure: Never   Smokeless tobacco: Never  Vaping Use   Vaping status: Never Used  Substance and Sexual Activity   Alcohol use: Yes    Alcohol/week: 42.0 standard drinks of alcohol    Types: 42 Cans of beer per week    Comment: 3-4 per day   Drug use: Never   Sexual activity: Not Currently    Comment: vasectomy   Tobacco Counseling Counseling given: Not Answered  SDOH Screenings   Food Insecurity: No Food Insecurity (02/04/2024)  Housing: Unknown (02/04/2024)  Transportation Needs: No  Transportation Needs (02/04/2024)  Utilities: Not At Risk (02/04/2024)  Alcohol Screen: Low Risk  (01/08/2024)  Depression (PHQ2-9): Low Risk  (02/04/2024)  Financial Resource Strain: Low Risk  (01/08/2024)  Physical Activity: Sufficiently Active (02/04/2024)  Social Connections: Moderately Integrated (02/04/2024)  Stress: No Stress Concern Present (02/04/2024)  Tobacco Use: Medium Risk (02/04/2024)  Health Literacy: Adequate Health Literacy (02/04/2024)   Depression  Screen    02/04/2024   10:29 AM 01/08/2024   10:27 AM  PHQ 2/9 Scores  PHQ - 2 Score 0 0  PHQ- 9 Score 0 0      Data saved with a previous flowsheet row definition      Goals Addressed             This Visit's Progress    Patient Stated       Stay healthy/2025       Visit info / Clinical Intake: Medicare Wellness Visit Type:: Initial Annual Wellness Visit Persons participating in visit:: patient Medicare Wellness Visit Mode:: Video Because this visit was a virtual/telehealth visit:: vitals recorded from last visit If Telephone or Video please confirm:: I connected with the patient using audio enabled telemedicine application and verified that I am speaking with the correct person using two identifiers; I discussed the limitations of evaluation and management by telemedicine; The patient expressed understanding and agreed to proceed Patient Location:: Car Provider Location:: Home Information given by:: patient Interpreter Needed?: No Pre-visit prep was completed: no AWV questionnaire completed by patient prior to visit?: no Living arrangements:: lives with spouse/significant other Patient's Overall Health Status Rating: good Typical amount of pain: none Does pain affect daily life?: no Are you currently prescribed opioids?: no  Dietary Habits and Nutritional Risks How many meals a day?: 3 Eats fruit and vegetables daily?: yes Most meals are obtained by: preparing own meals Diabetic:: no  Functional Status Activities of Daily Living (to include ambulation/medication): Independent Ambulation: Independent Medication Administration: Independent Home Management: Independent Manage your own finances?: yes Primary transportation is: driving Concerns about vision?: (!) yes (can not read small print/wears reading glasses) Concerns about hearing?: no  Fall Screening Falls in the past year?: 0 Number of falls in past year: 0 Was there an injury with Fall?: 0 Fall  Risk Category Calculator: 0 Patient Fall Risk Level: Low Fall Risk  Fall Risk Patient at Risk for Falls Due to: No Fall Risks Fall risk Follow up: Falls prevention discussed; Falls evaluation completed  Home and Transportation Safety: All stairs or steps have railings?: N/A, no stairs Grab bars in the bathtub or shower?: (!) no Have non-skid surface in bathtub or shower?: (!) no Good home lighting?: yes Regular seat belt use?: yes Hospital stays in the last year:: no  Cognitive Assessment Difficulty concentrating, remembering, or making decisions? : no Will 6CIT or Mini Cog be Completed: no 6CIT or Mini Cog Declined: patient alert, oriented, able to answer questions appropriately and recall recent events  Advance Directives (For Healthcare) Does Patient Have a Medical Advance Directive?: No Would patient like information on creating a medical advance directive?: No - Patient declined  Reviewed/Updated  Reviewed/Updated: Reviewed All (Medical, Surgical, Family, Medications, Allergies, Care Teams, Patient Goals)        Objective:    Today's Vitals   02/04/24 1008  Weight: 195 lb (88.5 kg)  Height: 6' (1.829 m)   Body mass index is 26.45 kg/m.  Current Medications (verified) Outpatient Encounter Medications as of 02/04/2024  Medication Sig   b complex  vitamins capsule Take 1 capsule by mouth daily.   Cholecalciferol (VITAMIN D-3 PO) Take 1 tablet by mouth in the morning.   lisinopril  (ZESTRIL ) 10 MG tablet Take 10 mg by mouth in the morning. (Patient taking differently: Take 10 mg by mouth in the morning and at bedtime.)   rosuvastatin (CRESTOR) 10 MG tablet Take 1 tablet (10 mg total) by mouth daily.   sildenafil (VIAGRA) 100 MG tablet Take 50 mg by mouth daily as needed for erectile dysfunction.   tamsulosin (FLOMAX) 0.4 MG CAPS capsule Take 0.4 mg by mouth in the morning and at bedtime.   traZODone  (DESYREL ) 50 MG tablet Take 1.5 tablets (75 mg total) by mouth at  bedtime.   oxyCODONE -acetaminophen  (PERCOCET) 5-325 MG tablet Take 1 tablet by mouth every 4 (four) hours as needed for up to 18 doses for severe pain (pain score 7-10). (Patient not taking: Reported on 02/04/2024)   No facility-administered encounter medications on file as of 02/04/2024.   Hearing/Vision screen Hearing Screening - Comments:: Denies hearing difficulties   Vision Screening - Comments:: Wear reading glasses/not UTD/need recommendation  Immunizations and Health Maintenance Health Maintenance  Topic Date Due   Medicare Annual Wellness (AWV)  Never done   Hepatitis C Screening  Never done   DTaP/Tdap/Td (1 - Tdap) Never done   Pneumococcal Vaccine: 50+ Years (1 of 2 - PCV) Never done   Colonoscopy  12/16/2022   Influenza Vaccine  10/25/2023   COVID-19 Vaccine (6 - 2025-26 season) 11/25/2023   Zoster Vaccines- Shingrix  Completed   Meningococcal B Vaccine  Aged Out        Assessment/Plan:  This is a routine wellness examination for Billy Hernandez.  Patient Care Team: Billy Philippe SAUNDERS, NP as PCP - General (Family Medicine) Tina Pauletta BROCKS, MD as Consulting Physician (Hematology and Oncology) Selma Donnice SAUNDERS, MD as Consulting Physician (Urology)  I have personally reviewed and noted the following in the patient's chart:   Medical and social history Use of alcohol, tobacco or illicit drugs  Current medications and supplements including opioid prescriptions. Functional ability and status Nutritional status Physical activity Advanced directives List of other physicians Hospitalizations, surgeries, and ER visits in previous 12 months Vitals Screenings to include cognitive, depression, and falls Referrals and appointments  No orders of the defined types were placed in this encounter.  In addition, I have reviewed and discussed with patient certain preventive protocols, quality metrics, and best practice recommendations. A written personalized care plan for preventive  services as well as general preventive health recommendations were provided to patient.   Billy Hernandez, CMA   02/04/2024   No follow-ups on file.  After Visit Summary: (MyChart) Due to this being a telephonic visit, the after visit summary with patients personalized plan was offered to patient via MyChart   Nurse Notes: Patient is due for a colonoscopy and order has been placed today.  I have listed phone number for patient to call and get scheduled.  Patient is due for a flu vaccine and a Tdap.  He is also due for a pneumonia vaccine and would like to discuss vaccine during next office visit.  Patient is due for a Hep C screening as well.  He had no other concerns to address today.

## 2024-02-12 DIAGNOSIS — C679 Malignant neoplasm of bladder, unspecified: Secondary | ICD-10-CM | POA: Insufficient documentation

## 2024-02-12 DIAGNOSIS — E538 Deficiency of other specified B group vitamins: Secondary | ICD-10-CM | POA: Insufficient documentation

## 2024-02-12 NOTE — Assessment & Plan Note (Deleted)
 Continue cystoscopy If recurrence recommend cystectomy

## 2024-02-12 NOTE — Assessment & Plan Note (Deleted)
 Continue CT surveillance to include ct chest

## 2024-02-12 NOTE — Assessment & Plan Note (Deleted)
 Had started B12

## 2024-02-12 NOTE — Progress Notes (Deleted)
 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    Pauletta BROCKS Tina, MD  INTERVAL HISTORY: Patient returns for follow-up.  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 12/2018 CT with 3.5 cm mass in the posterior right bladder.    01/2019 Surgery   TURBT  with gemcitabine  for 3.5cm right trigonal tumor. Path: HG papillary urothelial carcinoma.    04/2019 Procedure   Completed 6/6 BCG induction in  and completed 3 maintenance BCG cycles.    10/2020 Surgery   TURBT a small papillary lesion at the bladder neck/prostatic junction. Pathology: Noninvasive high grade papillary urothelial carcinoma. Did not undergo BCG following TURBT in 10/2020   09/25/2022 Procedure   Cystoscopy with tumor protruding from the right ureteral orifice with adjacent mass affect from the distal right ureter. No papillary lesions within the bladder itself.   10/24/2022 Surgery   TURBT  A. BLADDER LESION, RIGHT LATERAL WALL, BIOPSY:  Noninvasive high-grade urothelial carcinoma with early papillary formation  Muscularis propria (detrusor muscle) is not present   B. URETERAL TUMOR, RIGHT, BIOPSY:  Noninvasive high-grade urothelial carcinoma    11/12/2022 Initial Diagnosis   Urothelial carcinoma (HCC)   11/12/2022 Surgery   Robotic  assisted right laparoscopic nephroureterectomy    A. 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, size 1.8 and 2.7 cm  The carcinoma invades the muscularis (pT2)  Lymphovascular invasion is identified  Urothelial carcinoma in situ is present at renal pelvis  All margins of resection are negative for carcinoma   RENAL PELVIS AND URETER:   Procedure: Nephroureterectomy  Specimen Laterality: Right  Tumor Site: Distal ureter  Histologic Type: Urothelial carcinoma  Histologic Grade: High grade  Tumor Extension: Muscularis  Lymphovascular Invasion: Identified  Margin status for invasive carcinoma: Negative  Margin status for carcinoma in situ/ Noninvasive papillary urothelial  carcinoma: Negative  Regional Lymph Nodes:          No Lymph Nodes Submitted or found: X          Number of Lymph Nodes Involved: NA          Size of Largest Nodal Metastatic Deposit/ specify site: NA          Number of Lymph Nodes Examined: NA  Distant Metastasis: NA  Pathologic Stage Classification (pTNM, AJCC 8th Edition): pT2, pN  Pathologic Findings in Ipsilateral Nonneoplastic Renal Tissue: chronic  obstructive changes with prominent pyelonephritis    12/10/2022 Cancer Staging   Staging form: Renal Pelvis and Ureter, AJCC 8th Edition - Clinical: Stage Unknown (cT2, cNX, cM0) - Signed by Tina Pauletta BROCKS, MD on 12/10/2022 Stage prefix: Initial diagnosis WHO/ISUP grade (low/high): High Grade Histologic grading system: 2 grade system   12/20/2022 Imaging   CT CAP 1. Status post right nephroureterectomy with a small amount of nodular thickening along the right posterior aspect of the urinary bladder wall, possibly reflecting scarring/postsurgical  change but would recommend correlation with cystoscopy. 2. No evidence of metastatic disease in the chest, abdomen or pelvis. 3.  Aortic Atherosclerosis (ICD10-I70.0).   01/08/2023 - 05/27/2023 Chemotherapy    BLADDER Carboplatin  D1 + Gemcitabine  D1,8 q21d      02/07/2023 Procedure   cystoscopy showed well-healing closure around prior placement of right ureteral orifice. No evidence of any recurrent papillary lesion.    06/2023 Imaging   CT CAP: NED. Small thickening of the right lateral wall, no focal lesion.  No metastatic disease.   06/2023 Procedure   06/2023 cystoscopy showed well-healing closure around prior placement of right ureteral orifice. No evidence of any recurrent papillary lesion.       PHYSICAL EXAMINATION: ECOG PERFORMANCE STATUS: {CHL ONC ECOG PS:[id number]}  There were no vitals filed for this visit. There were no vitals filed for this visit.  GENERAL: alert, no distress and comfortable SKIN: skin color normal and no jaundice or bruising or petechiae on exposed skin EYES: normal, sclera clear OROPHARYNX: no exudate  NECK: No palpable mass LYMPH:  no palpable cervical, axillary lymphadenopathy  LUNGS: clear to auscultation and no wheeze or rales with normal breathing effort HEART: regular rate & rhythm  ABDOMEN: abdomen soft, non-tender and nondistended. Musculoskeletal: no edema NEURO: no focal motor/sensory deficits  Relevant data reviewed during this visit included labs.  New labs ordered.

## 2024-02-13 ENCOUNTER — Other Ambulatory Visit

## 2024-02-13 ENCOUNTER — Inpatient Hospital Stay

## 2024-02-13 DIAGNOSIS — E538 Deficiency of other specified B group vitamins: Secondary | ICD-10-CM

## 2024-02-13 DIAGNOSIS — C679 Malignant neoplasm of bladder, unspecified: Secondary | ICD-10-CM

## 2024-02-13 DIAGNOSIS — Z08 Encounter for follow-up examination after completed treatment for malignant neoplasm: Secondary | ICD-10-CM

## 2024-02-18 ENCOUNTER — Ambulatory Visit

## 2024-02-18 ENCOUNTER — Other Ambulatory Visit: Payer: Self-pay

## 2024-02-18 ENCOUNTER — Telehealth: Payer: Self-pay | Admitting: Family Medicine

## 2024-02-18 DIAGNOSIS — E538 Deficiency of other specified B group vitamins: Secondary | ICD-10-CM

## 2024-02-18 DIAGNOSIS — I10 Essential (primary) hypertension: Secondary | ICD-10-CM

## 2024-02-18 MED ORDER — CYANOCOBALAMIN 1000 MCG/ML IJ SOLN: 1000.0000 ug | Freq: Once | INTRAMUSCULAR | 3 refills | Status: AC

## 2024-02-18 MED ORDER — NEEDLES & SYRINGES MISC: 1.0000 | 3 refills | Status: AC

## 2024-02-18 MED ORDER — TRAZODONE HCL 50 MG PO TABS: 75.0000 mg | ORAL_TABLET | Freq: Every day | ORAL | 0 refills | Status: DC

## 2024-02-18 MED ORDER — LISINOPRIL 10 MG PO TABS: 10.0000 mg | ORAL_TABLET | Freq: Two times a day (BID) | ORAL | 0 refills | Status: AC

## 2024-02-18 MED ORDER — CYANOCOBALAMIN 1000 MCG/ML IJ SOLN
1000.0000 ug | Freq: Once | INTRAMUSCULAR | Status: AC
  Administered 2024-02-18: 1000 ug via INTRAMUSCULAR

## 2024-02-18 NOTE — Progress Notes (Signed)
 Patient is in office today for a nurse visit for B12 Injection. Patient Injection was given in the  Left deltoid. Patient tolerated injection well.

## 2024-02-18 NOTE — Telephone Encounter (Unsigned)
 Copied from CRM [id number]. Topic: Clinical - Medication Refill >> Feb 18, 2024  1:29 PM Harlene ORN wrote: Medication: lisinopril  (ZESTRIL ) 10 MG tablet  Has the patient contacted their pharmacy? Yes (Agent: If no, request that the patient contact the pharmacy for the refill. If patient does not wish to contact the pharmacy document the reason why and proceed with request.) (Agent: If yes, when and what did the pharmacy advise?)  This is the patient's preferred pharmacy:  [id number] - [place], [place] - [address]  [place] [address] Phone: [phone] Fax: [phone]  Is this the correct pharmacy for this prescription? Yes If no, delete pharmacy and type the correct one.   Has the prescription been filled recently? No  Is the patient out of the medication? No  Has the patient been seen for an appointment in the last year OR does the patient have an upcoming appointment? Yes  Can we respond through MyChart? Yes  Agent: Please be advised that Rx refills may take up to 3 business days. We ask that you follow-up with your pharmacy.

## 2024-02-18 NOTE — Telephone Encounter (Signed)
 Refill sent.

## 2024-02-24 ENCOUNTER — Other Ambulatory Visit: Payer: Self-pay

## 2024-02-24 DIAGNOSIS — E785 Hyperlipidemia, unspecified: Secondary | ICD-10-CM

## 2024-02-24 MED ORDER — TAMSULOSIN HCL 0.4 MG PO CAPS: 0.4000 mg | ORAL_CAPSULE | Freq: Two times a day (BID) | ORAL | 0 refills | Status: AC

## 2024-02-24 MED ORDER — ROSUVASTATIN CALCIUM 10 MG PO TABS: 10.0000 mg | ORAL_TABLET | Freq: Every day | ORAL | 3 refills | Status: AC

## 2024-04-08 ENCOUNTER — Other Ambulatory Visit: Payer: Self-pay

## 2024-04-11 ENCOUNTER — Other Ambulatory Visit: Payer: Self-pay | Admitting: Family Medicine

## 2024-04-13 ENCOUNTER — Other Ambulatory Visit

## 2024-04-24 ENCOUNTER — Ambulatory Visit

## 2024-04-24 ENCOUNTER — Encounter: Payer: Self-pay | Admitting: Internal Medicine

## 2024-04-24 VITALS — Ht 72.0 in | Wt 180.0 lb

## 2024-04-24 DIAGNOSIS — Z860100 Personal history of colon polyps, unspecified: Secondary | ICD-10-CM

## 2024-04-24 MED ORDER — NA SULFATE-K SULFATE-MG SULF 17.5-3.13-1.6 GM/177ML PO SOLN: 1.0000 | Freq: Once | ORAL | 0 refills | Status: AC

## 2024-04-24 NOTE — Progress Notes (Signed)
 Pre visit completed via phone call; Patient verified name, DOB, and address; No egg or soy allergy known to patient;  No issues known to pt with past sedation with any surgeries or procedures; Patient denies ever being told they had issues or difficulty with intubation;  No FH of Malignant Hyperthermia; Pt is not on diet pills; Pt is not on home 02;  Pt is not on blood thinners;  Pt denies issues with constipation;  No A fib or A flutter; Have any cardiac testing pending--NO Insurance verified during PV appt--- [employer] Medicare Pt can ambulate without assistance;  Pt denies use of chewing tobacco; Discussed diabetic/weight loss medication holds; Discussed NSAID holds; Checked BMI to be less than 50; Pt instructed to use Single[employer] or GoodRx for a price reduction on prep;  Patient's chart reviewed by Norleen Schillings CNRA prior to previsit and patient appropriate for the [facility];  Pre visit completed and red dot placed by patient's name on their procedure day (on provider's schedule).   Instructions sent to MyChart as well as printed and placed on the 2nd floor for patient to pick up at his convenience;

## 2024-05-11 ENCOUNTER — Encounter: Admitting: Internal Medicine
